# Patient Record
Sex: Female | Born: 1935 | Race: White | Hispanic: No | Smoking: Never smoker
Health system: Southern US, Community
[De-identification: ages and names within clinical notes are randomized; demographics above are authoritative.]

## PROBLEM LIST (undated history)

## (undated) DIAGNOSIS — R011 Cardiac murmur, unspecified: Secondary | ICD-10-CM

## (undated) DIAGNOSIS — K635 Polyp of colon: Secondary | ICD-10-CM

## (undated) DIAGNOSIS — M1990 Unspecified osteoarthritis, unspecified site: Secondary | ICD-10-CM

## (undated) DIAGNOSIS — Z9289 Personal history of other medical treatment: Secondary | ICD-10-CM

## (undated) DIAGNOSIS — K529 Noninfective gastroenteritis and colitis, unspecified: Secondary | ICD-10-CM

## (undated) DIAGNOSIS — T7840XA Allergy, unspecified, initial encounter: Secondary | ICD-10-CM

## (undated) DIAGNOSIS — R002 Palpitations: Secondary | ICD-10-CM

## (undated) DIAGNOSIS — Z5189 Encounter for other specified aftercare: Secondary | ICD-10-CM

## (undated) DIAGNOSIS — I5030 Unspecified diastolic (congestive) heart failure: Secondary | ICD-10-CM

## (undated) DIAGNOSIS — E079 Disorder of thyroid, unspecified: Secondary | ICD-10-CM

## (undated) DIAGNOSIS — I10 Essential (primary) hypertension: Secondary | ICD-10-CM

## (undated) DIAGNOSIS — F32A Depression, unspecified: Secondary | ICD-10-CM

## (undated) DIAGNOSIS — C801 Malignant (primary) neoplasm, unspecified: Secondary | ICD-10-CM

## (undated) DIAGNOSIS — K5790 Diverticulosis of intestine, part unspecified, without perforation or abscess without bleeding: Secondary | ICD-10-CM

## (undated) DIAGNOSIS — E785 Hyperlipidemia, unspecified: Secondary | ICD-10-CM

## (undated) DIAGNOSIS — Z9889 Other specified postprocedural states: Secondary | ICD-10-CM

## (undated) DIAGNOSIS — IMO0001 Reserved for inherently not codable concepts without codable children: Secondary | ICD-10-CM

## (undated) DIAGNOSIS — I341 Nonrheumatic mitral (valve) prolapse: Secondary | ICD-10-CM

## (undated) DIAGNOSIS — F329 Major depressive disorder, single episode, unspecified: Secondary | ICD-10-CM

## (undated) HISTORY — DX: Polyp of colon: K63.5

## (undated) HISTORY — DX: Cardiac murmur, unspecified: R01.1

## (undated) HISTORY — DX: Hyperlipidemia, unspecified: E78.5

## (undated) HISTORY — DX: Allergy, unspecified, initial encounter: T78.40XA

## (undated) HISTORY — DX: Nonrheumatic mitral (valve) prolapse: I34.1

## (undated) HISTORY — PX: OTHER SURGICAL HISTORY: SHX169

## (undated) HISTORY — DX: Other specified postprocedural states: Z98.890

## (undated) HISTORY — PX: HIP ARTHROSCOPY W/ LABRAL REPAIR: SHX1750

## (undated) HISTORY — DX: Reserved for inherently not codable concepts without codable children: IMO0001

## (undated) HISTORY — PX: BELPHAROPTOSIS REPAIR: SHX369

## (undated) HISTORY — DX: Encounter for other specified aftercare: Z51.89

## (undated) HISTORY — DX: Major depressive disorder, single episode, unspecified: F32.9

## (undated) HISTORY — DX: Essential (primary) hypertension: I10

## (undated) HISTORY — PX: PARTIAL HYSTERECTOMY: SHX80

## (undated) HISTORY — DX: Unspecified diastolic (congestive) heart failure: I50.30

## (undated) HISTORY — DX: Personal history of other medical treatment: Z92.89

## (undated) HISTORY — DX: Unspecified osteoarthritis, unspecified site: M19.90

## (undated) HISTORY — DX: Diverticulosis of intestine, part unspecified, without perforation or abscess without bleeding: K57.90

## (undated) HISTORY — PX: DILATION AND CURETTAGE, DIAGNOSTIC / THERAPEUTIC: SUR384

## (undated) HISTORY — DX: Palpitations: R00.2

## (undated) HISTORY — DX: Noninfective gastroenteritis and colitis, unspecified: K52.9

## (undated) HISTORY — DX: Disorder of thyroid, unspecified: E07.9

## (undated) HISTORY — DX: Depression, unspecified: F32.A

## (undated) HISTORY — DX: Malignant (primary) neoplasm, unspecified: C80.1

## (undated) HISTORY — PX: CATARACT EXTRACTION: SUR2

## (undated) HISTORY — PX: COLONOSCOPY: SHX174

---

## 2000-11-09 ENCOUNTER — Encounter: Payer: Self-pay | Admitting: Internal Medicine

## 2003-01-29 ENCOUNTER — Encounter: Payer: Self-pay | Admitting: Internal Medicine

## 2006-01-16 ENCOUNTER — Ambulatory Visit: Payer: Self-pay | Admitting: Internal Medicine

## 2006-01-30 ENCOUNTER — Ambulatory Visit: Payer: Self-pay | Admitting: Internal Medicine

## 2010-04-01 ENCOUNTER — Encounter: Payer: Self-pay | Admitting: Internal Medicine

## 2010-04-01 ENCOUNTER — Telehealth: Payer: Self-pay | Admitting: Internal Medicine

## 2010-04-02 DIAGNOSIS — K5909 Other constipation: Secondary | ICD-10-CM | POA: Insufficient documentation

## 2010-04-02 DIAGNOSIS — K5730 Diverticulosis of large intestine without perforation or abscess without bleeding: Secondary | ICD-10-CM | POA: Insufficient documentation

## 2010-04-02 DIAGNOSIS — Z860100 Personal history of colon polyps, unspecified: Secondary | ICD-10-CM | POA: Insufficient documentation

## 2010-04-05 ENCOUNTER — Other Ambulatory Visit: Payer: Self-pay | Admitting: Internal Medicine

## 2010-04-05 ENCOUNTER — Ambulatory Visit
Admission: RE | Admit: 2010-04-05 | Discharge: 2010-04-05 | Payer: Self-pay | Source: Home / Self Care | Attending: Internal Medicine | Admitting: Internal Medicine

## 2010-04-05 DIAGNOSIS — R197 Diarrhea, unspecified: Secondary | ICD-10-CM | POA: Insufficient documentation

## 2010-04-05 LAB — CBC WITH DIFFERENTIAL/PLATELET
Basophils Relative: 0.5 % (ref 0.0–3.0)
Eosinophils Relative: 1.3 % (ref 0.0–5.0)
HCT: 36 % (ref 36.0–46.0)
Lymphs Abs: 1.4 10*3/uL (ref 0.7–4.0)
MCV: 89.1 fl (ref 78.0–100.0)
Monocytes Absolute: 0.5 10*3/uL (ref 0.1–1.0)
Neutro Abs: 3.7 10*3/uL (ref 1.4–7.7)
Platelets: 329 10*3/uL (ref 150.0–400.0)
RBC: 4.04 Mil/uL (ref 3.87–5.11)
WBC: 5.7 10*3/uL (ref 4.5–10.5)

## 2010-04-07 ENCOUNTER — Encounter: Payer: Self-pay | Admitting: Internal Medicine

## 2010-04-15 NOTE — Procedures (Signed)
Summary: COLON   Colonoscopy  Procedure date:  11/09/2000  Findings:      Location:  [facility].   Patient Name: Rebecca Stanley, Rebecca Stanley MRN:  Procedure Procedures: Colonoscopy CPT: 16109.  Personnel: Endoscopist: Dora L. Juanda Chance, MD.  Exam Location: Exam performed in Outpatient Clinic. Outpatient  Patient Consent: Procedure, Alternatives, Risks and Benefits discussed, consent obtained, from patient.  Indications Symptoms: Constipation Patient's stools are infrequent. Patient has difficulty evacuating, strains with stool passage. Abdominal pain / bloating.  History  Pre-Exam Physical: Performed Nov 09, 2000. Cardio-pulmonary exam, Rectal exam, HEENT exam , Abdominal exam, Extremity exam, Neurological exam, Mental status exam WNL.  Exam Exam: Extent of exam reached: Cecum, extent intended: Cecum.  Colon retroflexion performed. Images taken. ASA Classification: II. Tolerance: good.  Monitoring: Pulse and BP monitoring, Oximetry used. Supplemental O2 given.  Colon Prep Used Golytely for colon prep. Prep results: fair.  Sedation Meds: Patient assessed and found to be appropriate for moderate (conscious) sedation. Fentanyl 100 mcg. Versed 7 mg.  Findings - POLYP: Sigmoid Colon, diminutive, sessile polyp. Procedure:  hot biopsy, removed, retrieved, Polyp sent to pathology. ICD9: Colon Polyps: 211.3.  - DIVERTICULOSIS: Descending Colon to Sigmoid Colon. Not bleeding. ICD9: Diverticulosis: 562.10.   Assessment Abnormal examination, see findings above.  Diagnoses: 562.10: Diverticulosis.  211.3: Colon Polyps.   Events  Unplanned Interventions: No intervention was required.  Unplanned Events: There were no complications. Plans  Post Exam Instructions: No aspirin or non-steroidal containing medications: 2 weeks.  Medication Plan: Await pathology. Anti-constipation: Cathartic Laxatives   Patient Education: Patient given standard instructions for: Polyps.  Diverticulosis. Yearly hemoccult testing recommended. Patient instructed to get routine colonoscopy every 5 years.  Comments: high fiber diet Disposition: After procedure patient sent to recovery. After recovery patient sent home.   This report was created from the original endoscopy report, which was reviewed and signed by the above listed endoscopist.    Appended Document: COLON Although report indicates that polyp was sent to pathology, no pathology can be located in patient's paper chart, EChart or at [facility] pathology as of date.

## 2010-04-15 NOTE — Progress Notes (Signed)
Summary: Gastroentology  Gastroentology   Imported By: Lamona Curl CMA (AAMA) 04/02/2010 13:52:48  _____________________________________________________________________  External Attachment:    Type:   Image     Comment:   External Document

## 2010-04-15 NOTE — Progress Notes (Signed)
Summary: Diarrhea x2wks  Phone Note Call from Patient Call back at Home Phone [phone] Call back at [phone]   Call For: Dr Juanda Chance Reason for Call: Talk to Nurse Summary of Call: Diarrhea x2wks unsure if she should go see her primary care. Initial call taken by: Leanor Kail [facility],  April 01, 2010 9:18 AM  Follow-up for Phone Call        Patient calling to report for the last 2-3 weeks she has had watery diarrhea 3-4 times/day. She reports 3 watery diarhea stools this AM. Denies nausea, vomiting, bleeding, new medications,out of country travel or recent surgery. States she has not taken antibiotics since Thanksgiving. Patient did take an Imodium this AM about has not been taking it. Patient states she is usually constipated so this is very different for her. Hx of adenomataous polyps. Last colon- 01/30/2006- mild diverticulosis, no recurrent polyps.  Patient scheduled to see Willette Cluster, RNP on 04/02/10 at 9:00 AM. Follow-up by: Jesse Fall RN,  April 01, 2010 10:06 AM  Additional Follow-up for Phone Call Additional follow up Details #1::        Could I see her on Monday 1/23 in place of Rolly Pancake who canceled? Please send her Cipro 250 mg, #10 1 by mouth two times a day  to take till I see her. Additional Follow-up by: Hart Carwin MD,  April 01, 2010 12:57 PM    Additional Follow-up for Phone Call Additional follow up Details #2::    Called patient and moved her appointment to 04/05/10 at 1:45 PM with Dr. Juanda Chance. Rx to pharmacy. Follow-up by: Jesse Fall RN,  April 01, 2010 1:13 PM  New/Updated Medications: CIPROFLOXACIN HCL 250 MG TABS (CIPROFLOXACIN HCL) Take one by mouth bid Prescriptions: CIPROFLOXACIN HCL 250 MG TABS (CIPROFLOXACIN HCL) Take one by mouth bid  #10 x 0   Entered by:   Jesse Fall RN   Authorized by:   Hart Carwin MD   Signed by:   Jesse Fall RN on 04/01/2010   Method used:   [facility]  [address] [id number]       [address]       [place], [place]  [address]       Ph: [phone] or [phone]       Fax: [phone]   RxID:   [id number]

## 2010-04-15 NOTE — Assessment & Plan Note (Signed)
Summary: Diarrhea/ Rebecca Stanley   History of Present Illness Visit Type: Initial Visit Primary GI MD: [name] MD Primary Provider: Hollace Kinnier Requesting Provider: n/a Chief Complaint: Pt c/o diarrhea History of Present Illness:   This is a 77 year old white female with a 2 week history of acute diarrhea. She has a history of chronic constipation for which we have seen her for in the past and have given her MiraLax 17 g daily. The acute diarrheal illness started rather suddenly. There was no fever or abdominal pain. There was no bleeding. She took antibiotics during Thanksgiving. Her husband who passed away 2 years ago had C. difficile colitis. Her last colonoscopy in 2007 showed diverticulosis. A prior colonoscopy in 2002 showed a small colon polyp, no pathology report is available as per [facility]ology.   GI Review of Systems    Reports belching and  bloating.      Denies abdominal pain, acid reflux, chest pain, dysphagia with liquids, dysphagia with solids, heartburn, loss of appetite, nausea, vomiting, vomiting blood, weight loss, and  weight gain.      Reports change in bowel habits, constipation, and  diarrhea.     Denies anal fissure, black tarry stools, diverticulosis, fecal incontinence, heme positive stool, hemorrhoids, irritable bowel syndrome, jaundice, light color stool, liver problems, rectal bleeding, and  rectal pain. Preventive Screening-Counseling & Management      Drug Use:  no.      Current Medications (verified): 1)  Ciprofloxacin Hcl 250 Mg Tabs (Ciprofloxacin Hcl) .... Take One By Mouth Bid 2)  Simvastatin 20 Mg Tabs (Simvastatin) .Marland Kitchen.. 1 By Mouth At Bedtime 3)  Perphenazine-Amitriptyline 4-50 Mg Tabs (Perphenazine-Amitriptyline) .... 2 By Mouth At Bedtime 4)  Synthroid 25 Mcg Tabs (Levothyroxine Sodium) .... 2.5 By Mouth Once Daily 5)  Fosamax 70 Mg Tabs (Alendronate Sodium) .Marland Kitchen.. 1 By Mouth Every Week 6)  [facility] Spectravite Senior  Tabs (Multiple  Vitamins-Minerals) .Marland Kitchen.. 1 By Mouth Once Daily 7)  Calcium 600 1500 Mg Tabs (Calcium Carbonate) .... 2 By Mouth Once Daily 8)  Fish Oil  Oil (Fish Oil) .... 1000mg  Two Times A Day 9)  Super B Complex  Tabs (B Complex-C) .Marland Kitchen.. 1 By Mouth Once Daily 10)  Melatonin 5 Mg Tabs (Melatonin) .... 10mg  At Bedtime  Allergies (verified): No Known Drug Allergies  Past History:  Past Medical History: Current Problems:  DIVERTICULOSIS, COLON (ICD-562.10) COLONIC POLYPS, BENIGN, HX OF (ICD-V12.72) CONSTIPATION, CHRONIC (ICD-564.09) Arthritis Depression Hyperlipidemia Pneumonia  2005  Past Surgical History: Bladder Tack Partial Hysterectomy D & C Cataract Extraction Eye lids were lifted  Family History: Reviewed history from 04/02/2010 and no changes required. Family History of Heart Disease: Aunt, Mother Family History of Diabetes: Grandmother Family History of Breast Cancer:Aunt  Social History: Illicit Drug Use - no retired 1 child Alcohol Use - no  Review of Systems       The patient complains of arthritis/joint pain, back pain, fatigue, hearing problems, and sleeping problems.  The patient denies allergy/sinus, anemia, anxiety-new, blood in urine, breast changes/lumps, change in vision, confusion, cough, coughing up blood, depression-new, fainting, fever, headaches-new, heart murmur, heart rhythm changes, itching, menstrual pain, muscle pains/cramps, night sweats, nosebleeds, pregnancy symptoms, shortness of breath, skin rash, sore throat, swelling of feet/legs, swollen lymph glands, thirst - excessive , urination - excessive , urination changes/pain, urine leakage, vision changes, and voice change.         Pertinent positive and negative review of systems were noted in the above  HPI. All other ROS was otherwise negative.   Vital Signs:  Patient profile:   77 year old female Height:      64 inches Weight:      126 pounds BMI:     21.71 BSA:     1.61 Pulse rate:   92 /  minute Pulse rhythm:   regular BP sitting:   112 / 60  (left arm)  Vitals Entered By: Merri Ray CMA Duncan Dull) (April 05, 2010 1:19 PM)  Physical Exam  General:  Well developed, well nourished, no acute distress. Eyes:  PERRLA, no icterus. Mouth:  No deformity or lesions, dentition normal. Neck:  Supple; no masses or thyromegaly. Lungs:  Clear throughout to auscultation. Heart:  Regular rate and rhythm; no murmurs, rubs,  or bruits. Abdomen:  soft nontender abdomen hyperactive bowel sounds. No tympany. No distention. Liver edge at costal margin Rectal:  soft Hemoccult negative stool Extremities:  No clubbing, cyanosis, edema or deformities noted. Skin:  Intact without significant lesions or rashes. Psych:  Alert and cooperative. Normal mood and affect.   Impression & Recommendations:  Problem # 1:  DIARRHEA (ICD-787.91) Patient has had an acute diarrheal illness consistent with acute infectious cause. We will obtain a stool for C. difficile as well as culture. We will add  Flagyl 250 mg p.o. t.i.d. for one week. We will also check her CBC today. She is to follow a low residue diet. Samples have also been provided of Align. Orders: TLB-CBC Platelet - w/Differential (85025-CBCD) T-Culture, Stool ([id number]) T-C diff by PCR (16109)  Problem # 2:  DIVERTICULOSIS, COLON (ICD-562.10) She is up-to-date on her colonoscopy. A recall will be due in 2017. Orders: TLB-CBC Platelet - w/Differential (85025-CBCD) T-Culture, Stool ([id number]) T-C diff by PCR (60454)  Patient Instructions: 1)  Please pick up your prescriptions at the pharmacy. Electronic prescription(s) has already been sent for Cipro 250 mg by mouth two times a day x 5 more days. We have also sent a prescription for Flagyl 250 mg three times a day x 7 days. 2)  We have given you samples of Align to take one capsule by mouth once daily. If this works well for you, it can be purchased over the counter. 3)   We have given you a low residue diet to follow. 4)  Your physician requests that you go to the basement floor of our office to have the following labwork completed before leaving today: CBC, C.Diff by PCR, Stool Culture 5)  Copy sent to : Dr Keturah Barre 6)  The medication list was reviewed and reconciled.  All changed / newly prescribed medications were explained.  A complete medication list was provided to the patient / caregiver. Prescriptions: FLAGYL 250 MG TABS (METRONIDAZOLE) Take 1 tablet by mouth three times a day x 7 days  #21 x 0   Entered by:   Lamona Curl CMA (AAMA)   Authorized by:   Hart Carwin MD   Signed by:   Lamona Curl CMA (AAMA) on 04/05/2010   Method used:   [facility]  [address] [id number]       [address]       [place], [place]  [address]       Ph: [phone] or [phone]       Fax: [phone]   RxID:   [id number] CIPROFLOXACIN HCL 250 MG TABS (CIPROFLOXACIN HCL) Take one by mouth two times a day  #  10 x 0   Entered by:   Lamona Curl CMA (AAMA)   Authorized by:   Hart Carwin MD   Signed by:   Lamona Curl CMA (AAMA) on 04/05/2010   Method used:   [facility]        [facility]  [address] [id number]       [address]       [place], [place]  [address]       Ph: [phone] or [phone]       Fax: [phone]   RxID:   [id number]   Appended Document: Diarrhea/ Regina please call pt with negative stool studies. Continue the antibiotics we gave her. Her C.Diff test was negative.

## 2010-04-15 NOTE — Procedures (Signed)
Summary: colonscopy   Patient Name: Rebecca Stanley, Rebecca Stanley MRN:  Procedure Procedures: Colonoscopy CPT: 29562.  Personnel: Endoscopist: Harryette Shuart L. Juanda Chance, MD.  Exam Location: Exam performed in Outpatient Clinic. Outpatient  Patient Consent: Procedure, Alternatives, Risks and Benefits discussed, consent obtained, from patient. Consent was obtained by the RN.  Indications  Surveillance of: Adenomatous Polyp(s). Initial polypectomy was performed in 2002. 1-2 Polyps were found at Index Exam. Largest polyp removed was 6 to 9 mm. Prior polyp located in distal colon.  History  Current Medications: Patient is taking an non-steroidal medication. COX-2Patient is not currently taking Coumadin.  Pre-Exam Physical: Performed Jan 30, 2006. Entire physical exam was normal.  Comments: Pt. history reviewed/updated, physical exam performed prior to initiation of sedation?yes Exam Exam: Extent of exam reached: Cecum, extent intended: Cecum.  The cecum was identified by appendiceal orifice and IC valve. Colon retroflexion performed. Images taken. ASA Classification: II. Tolerance: good.  Monitoring: Pulse and BP monitoring, Oximetry used. Supplemental O2 given.  Colon Prep Used Miralax for colon prep. Prep results: good.  Sedation Meds: Patient assessed and found to be appropriate for moderate (conscious) sedation. Fentanyl 75 mcg. given IV. Versed 5 mg. given IV.  Findings - NORMAL EXAM: Cecum.  - DIVERTICULOSIS: Sigmoid Colon. ICD9: Diverticulosis: 562.10. Comments: mild sigmoid diverticulosis.  - NORMAL EXAM: Rectum.    Comments: scope withdrawal time 7:30 min Assessment Abnormal examination, see findings above.  Diagnoses: 562.10: Diverticulosis.   Comments: mild diverticulosis, no recurrent polyps Events  Unplanned Interventions: No intervention was required.  Unplanned Events: There were no complications. Plans Patient Education: Patient given standard instructions for: Patient  instructed to get routine colonoscopy every 7 years.  Disposition: After procedure patient sent to recovery. After recovery patient sent home.   cc: Keturah Barre, MD  This report was created from the original endoscopy report, which was reviewed and signed by the above listed endoscopist.

## 2010-04-15 NOTE — Letter (Signed)
Summary: New Patient letter  [facility]  [address] [place], [place] [address]   Phone: [phone]  Fax: [phone]       04/01/2010 MRN: [id number]  Rebecca Stanley [address] [place], [place]  [address]  Dear Rebecca Stanley,  Welcome to the Gastroenterology Division at [facility].    You are scheduled to see Dr.  Juanda Chance  on 04/05/2010  at 1:45 PM on the 3rd floor at [facility], [address]  We ask that you try to arrive at our office 15 minutes prior to your appointment time to allow for check-in.  We would like you to complete the enclosed self-administered evaluation form prior to your visit and bring it with you on the day of your appointment.  We will review it with you.  Also, please bring a complete list of all your medications or, if you prefer, bring the medication bottles and we will list them.  Please bring your insurance card so that we may make a copy of it.  If your insurance requires a referral to see a specialist, please bring your referral form from your primary care physician.  Co-payments are due at the time of your visit and may be paid by cash, check or credit card.     Your office visit will consist of a consult with your physician (includes a physical exam), any laboratory testing he/she may order, scheduling of any necessary diagnostic testing (e.g. x-ray, ultrasound, CT-scan), and scheduling of a procedure (e.g. Endoscopy, Colonoscopy) if required.  Please allow enough time on your schedule to allow for any/all of these possibilities.    If you cannot keep your appointment, please call [phone] to cancel or reschedule prior to your appointment date.  This allows [place] the opportunity to schedule an appointment for another patient in need of care.  If you do not cancel or reschedule by 5 p.m. the business day prior to your appointment date, you will be charged a $50.00 late cancellation/no-show fee.    Thank you for choosing  [facility] for your medical needs.  We appreciate the opportunity to care for you.  Please visit [place] at our website  to learn more about our practice.                     Sincerely,                                                             The Gastroenterology Division

## 2010-04-16 ENCOUNTER — Telehealth: Payer: Self-pay | Admitting: Internal Medicine

## 2010-04-21 NOTE — Progress Notes (Signed)
Summary: Triage  Phone Note Call from Patient Call back at [phone]   Caller: Patient Call For: Dr. Juanda Chance Reason for Call: Talk to Nurse Summary of Call: Pt has finished her medication for diarrhea and her diarrhea is continuing, wants to speak with [facility] Initial call taken by: [place] Johnson,  April 16, 2010 8:36 AM  Follow-up for Phone Call        Spoke with patient. She has finished her Cipro and Flagyl on Monday. States she is still having diarrhea. Sometimes she has 1-2 diarrhea stools/day but yesterday she had 3. States she never has a formed stool just stools that are "apple sauce consistency." She is taking Librarian, academic. Stools were negative for c. diff on 04/07/10. Please, advise. Follow-up by: Jesse Fall RN,  April 16, 2010 9:22 AM  Additional Follow-up for Phone Call Additional follow up Details #1::        I would like to try Medrol pack, #1, follow instructions on the package, she may have micrscopic colitis ( post infectious), then call us back. If no improvement, will consider flex sigmoid. Additional Follow-up by: Hart Carwin MD,  April 16, 2010 2:01 PM    Additional Follow-up for Phone Call Additional follow up Details #2::    Spoke with patient. Rx sent to pharmacy. She will call back with an update. Follow-up by: Jesse Fall RN,  April 16, 2010 2:36 PM  New/Updated Medications: MEDROL (PAK) 4 MG TABS (METHYLPREDNISOLONE) Take as directed Prescriptions: MEDROL (PAK) 4 MG TABS (METHYLPREDNISOLONE) Take as directed  #1 x 0   Entered by:   Jesse Fall RN   Authorized by:   Hart Carwin MD   Signed by:   Jesse Fall RN on 04/16/2010   Method used:   [facility]        [facility]  [address] [id number]       [address]       [place], [place]  [address]       Ph: [phone] or [phone]       Fax: [phone]   RxID:   [id number]

## 2010-04-23 ENCOUNTER — Telehealth (INDEPENDENT_AMBULATORY_CARE_PROVIDER_SITE_OTHER): Payer: Self-pay | Admitting: *Deleted

## 2010-05-05 NOTE — Progress Notes (Signed)
  Phone Note Outgoing Call Call back at [facility] Phone [phone]   Call placed by: Jesse Fall, RN Call placed to: Patient Summary of Call: Message left for patient to call back with an update on how she is doing.  Initial call taken by: Jesse Fall RN,  April 23, 2010 10:29 AM  Follow-up for Phone Call        Patient called to report she is feeling much better. She has finished her dose pack. Now she is having problems with constipation. She will try Miralax 1/2 dose to see if this helps and increase to full dose if no results. Follow-up by: Jesse Fall RN,  April 23, 2010 1:46 PM  Additional Follow-up for Phone Call Additional follow up Details #1::        OK Additional Follow-up by: Hart Carwin MD,  April 23, 2010 10:28 PM

## 2010-05-13 ENCOUNTER — Ambulatory Visit (INDEPENDENT_AMBULATORY_CARE_PROVIDER_SITE_OTHER): Payer: Medicare Other | Admitting: Internal Medicine

## 2010-05-13 ENCOUNTER — Encounter: Payer: Self-pay | Admitting: Internal Medicine

## 2010-05-13 DIAGNOSIS — K5289 Other specified noninfective gastroenteritis and colitis: Secondary | ICD-10-CM

## 2010-05-20 NOTE — Assessment & Plan Note (Signed)
Summary: 3 week follow-up   History of Present Illness Visit Type: Follow-up Visit Primary GI MD: [name] MD Primary Provider: Hollace Kinnier Requesting Provider: n/a Chief Complaint: 3 week follow-up c/o constipation now History of Present Illness:   This is a 77 year old white female with an acute diarrheal illness which subsided after a course of Medrol pack. She failed Flagyl and Cipro prior to the Medrol steroid taper. She is now well and is back to her constipation habits. She is on MiraLax which seems to be too strong for her and she does not want to take it. She takes 2 stool softeners in the morning. She denies any rectal bleeding or abdominal pain. Her last colonoscopy in 2007 showed mild diverticulosis of the left colon. A colonoscopy in 2002 showed a diminutive polyp of the left colon but no pathology report is available.   GI Review of Systems    Reports bloating.       Reports constipation.    Preventive Screening-Counseling & Management  Alcohol-Tobacco     Smoking Status: never    Current Medications (verified): 1)  Simvastatin 20 Mg Tabs (Simvastatin) .Marland Kitchen.. 1 By Mouth At Bedtime 2)  Perphenazine-Amitriptyline 4-50 Mg Tabs (Perphenazine-Amitriptyline) .... 2 By Mouth At Bedtime 3)  Synthroid 25 Mcg Tabs (Levothyroxine Sodium) .... 2.5 By Mouth Once Daily 4)  Fosamax 70 Mg Tabs (Alendronate Sodium) .Marland Kitchen.. 1 By Mouth Every Week 5)  [facility] Spectravite Senior  Tabs (Multiple Vitamins-Minerals) .Marland Kitchen.. 1 By Mouth Once Daily 6)  Calcium 600 1500 Mg Tabs (Calcium Carbonate) .... 2 By Mouth Once Daily 7)  Fish Oil  Oil (Fish Oil) .... 1000mg  Two Times A Day 8)  Super B Complex  Tabs (B Complex-C) .Marland Kitchen.. 1 By Mouth Once Daily 9)  Melatonin 5 Mg Tabs (Melatonin) .... 10mg  At Bedtime 10)  Align 4 Mg Caps (Probiotic Product) .Marland Kitchen.. 1 By Mouth Once Daily  Allergies (verified): No Known Drug Allergies  Past History:  Past Medical History: Reviewed history from 04/05/2010 and no  changes required. Current Problems:  DIVERTICULOSIS, COLON (ICD-562.10) COLONIC POLYPS, BENIGN, HX OF (ICD-V12.72) CONSTIPATION, CHRONIC (ICD-564.09) Arthritis Depression Hyperlipidemia Pneumonia  2005  Past Surgical History: Reviewed history from 04/05/2010 and no changes required. Bladder Tack Partial Hysterectomy D & C Cataract Extraction Eye lids were lifted  Family History: Reviewed history from 04/02/2010 and no changes required. Family History of Heart Disease: Aunt, Mother Family History of Diabetes: Grandmother Family History of Breast Cancer:Aunt No FH of Colon Cancer: Family History of Ovarian Cancer: cousin  Social History: Reviewed history from 04/05/2010 and no changes required. Illicit Drug Use - no retired 1 child Alcohol Use - no Daily Caffeine Use Patient has never smoked.  Smoking Status:  never  Vital Signs:  Patient profile:   77 year old female Height:      64 inches Weight:      125.8 pounds BMI:     21.67 Pulse rate:   76 / minute Pulse rhythm:   regular BP sitting:   146 / 78  (left arm)  Vitals Entered By: Milford Cage NCMA (May 13, 2010 11:20 AM)   Impression & Recommendations:  Problem # 1:  DIARRHEA (ICD-787.91) This has subsided after a one-week course of prednisone taper. 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 She is now constipated.  Problem # 2:  CONSTIPATION, CHRONIC (ICD-564.09) Patient has chronic functional constipation. She will add milk of magnesia 1-2 tablespoons at bedtime in  addition to stool softeners in the morning.  Patient Instructions: 1)  continue stool softeners, 2 tablets in the morning. 2)  Milk of magnesia 1-2 tablespoons at night. 3)  High-fiber diet. 4)  No plans for recall colonoscopy due to age. 5)  Copy sent to : Dr R. Robbins 6)  The medication list was reviewed and reconciled.  All changed / newly prescribed medications were explained.  A complete medication list was provided to  the patient / caregiver.

## 2010-05-31 ENCOUNTER — Telehealth: Payer: Self-pay | Admitting: Internal Medicine

## 2010-06-01 ENCOUNTER — Telehealth: Payer: Self-pay | Admitting: *Deleted

## 2010-06-01 DIAGNOSIS — R197 Diarrhea, unspecified: Secondary | ICD-10-CM

## 2010-06-01 MED ORDER — METHYLPREDNISOLONE 4 MG PO KIT: PACK | ORAL | Status: AC

## 2010-06-01 NOTE — Telephone Encounter (Signed)
Spoke with patient and gave her Dr. Regino Schultze recommendations from 05/31/10 note. [facility] [address] consultant[facility] for Medrol pack take as directed. Scheduled patient for colonoscopy on 07/15/10 at 9:00 AM with previsit on 06/30/10 at 10:30 AM.

## 2010-06-10 NOTE — Progress Notes (Signed)
Summary: triage  Phone Note Call from Patient Call back at Home Phone [phone]   Caller: Patient Call For: Dr. Juanda Chance Reason for Call: Talk to Nurse Summary of Call: Patient is calling to speak with a nurse about her diarrhea, she has had it again for a couple of weeks Initial call taken by: [place] Johnson,  May 31, 2010 10:59 AM  Follow-up for Phone Call        Patient calling to report that one week ago Thursday, she started with diarrhea again. States she is having loose, thin stools 1-3 times/day. Denies cramps or abdominal pain. States her stomach is "rolling". Occassional nausea and states she has lost weight(125 lbs to 121 llbs) States she is having heartburn that is relieved by Tums. Feeling sometimes like "food is in my throat." Taking Align.Last colonoscopy- 01/30/06- mild diverticulosis. Please, advise Follow-up by: Jesse Fall RN,  May 31, 2010 11:39 AM  Additional Follow-up for Phone Call Additional follow up Details #1::        please give her again Medrol pack, and schedule for colonoscopy. Additional Follow-up by: Hart Carwin MD,  May 31, 2010 5:16 PM    Additional Follow-up for Phone Call Additional follow up Details #2::    See notes in [facility]

## 2010-06-30 ENCOUNTER — Ambulatory Visit (AMBULATORY_SURGERY_CENTER): Payer: Medicare Other | Admitting: *Deleted

## 2010-06-30 VITALS — Ht 62.0 in | Wt 122.0 lb

## 2010-06-30 DIAGNOSIS — Z1211 Encounter for screening for malignant neoplasm of colon: Secondary | ICD-10-CM

## 2010-06-30 MED ORDER — PEG-KCL-NACL-NASULF-NA ASC-C 100 G PO SOLR: ORAL | Status: DC

## 2010-07-15 ENCOUNTER — Other Ambulatory Visit: Payer: Medicare Other | Admitting: Internal Medicine

## 2010-07-21 ENCOUNTER — Encounter: Payer: Self-pay | Admitting: Internal Medicine

## 2010-07-22 ENCOUNTER — Ambulatory Visit (AMBULATORY_SURGERY_CENTER): Payer: Medicare Other | Admitting: Internal Medicine

## 2010-07-22 ENCOUNTER — Encounter: Payer: Self-pay | Admitting: Internal Medicine

## 2010-07-22 VITALS — BP 139/67 | HR 66 | Temp 97.0°F | Resp 24 | Ht 65.0 in | Wt 125.0 lb

## 2010-07-22 DIAGNOSIS — D126 Benign neoplasm of colon, unspecified: Secondary | ICD-10-CM

## 2010-07-22 DIAGNOSIS — Z1211 Encounter for screening for malignant neoplasm of colon: Secondary | ICD-10-CM

## 2010-07-22 DIAGNOSIS — Z86010 Personal history of colonic polyps: Secondary | ICD-10-CM

## 2010-07-22 DIAGNOSIS — R197 Diarrhea, unspecified: Secondary | ICD-10-CM

## 2010-07-22 DIAGNOSIS — K5289 Other specified noninfective gastroenteritis and colitis: Secondary | ICD-10-CM

## 2010-07-22 MED ORDER — SODIUM CHLORIDE 0.9 % IV SOLN: 500.0000 mL | INTRAVENOUS | Status: DC

## 2010-07-22 NOTE — Patient Instructions (Signed)
PT. AND CAREPARTNER GIVEN INSTRUCTIONS AND INFORMATION ON DIVERTICULOSIS AND HIGH FIBER DIET . BIOPSIES WERE TAKEN IN COLON AND YOU WILL RECEIVE A LETTER WITH PATHOLOGY RESULTS AND RECOMMENDATIONS FROM DR BRODIE WITHIN A COUPLE OF WEEKS. WE WILL CALL YOU IN THE AM TO CHECK ON YOU.

## 2010-07-23 ENCOUNTER — Telehealth: Payer: Self-pay

## 2010-07-23 NOTE — Telephone Encounter (Signed)
Follow up Call- Patient questions:  Do you have a fever, pain , or abdominal swelling? no Pain Score  0 *  Have you tolerated food without any problems? yes  Have you been able to return to your normal activities? yes  Do you have any questions about your discharge instructions: Diet   no Medications  no Follow up visit  no  Do you have questions or concerns about your Care? no  Actions: * If pain score is 4 or above: No action needed, pain <4. Pt asked when she would hear from Bx taken...ibuprofen advised she would receive a letter in the mail 1-2 weeks.  MAW

## 2010-07-30 ENCOUNTER — Encounter: Payer: Self-pay | Admitting: Internal Medicine

## 2010-08-02 ENCOUNTER — Telehealth: Payer: Self-pay | Admitting: Internal Medicine

## 2010-08-02 ENCOUNTER — Telehealth: Payer: Self-pay | Admitting: *Deleted

## 2010-08-02 MED ORDER — BUDESONIDE 3 MG PO CP24: ORAL_CAPSULE | ORAL | Status: DC

## 2010-08-02 NOTE — Telephone Encounter (Signed)
Message copied by Jesse Fall on Mon Aug 02, 2010 11:07 AM ------      Message from: [name]      Created: Fri Jul 30, 2010 10:38 PM       This pt was asked to call You dddue to new diagnosis of lymphocytic colitis. Please start her on Entecort 3mg , #90, 3 po qd.x 4 weeks, the 6mg /day x 4 weeks then 3 mg/day x 4 weeks., then OV

## 2010-08-02 NOTE — Telephone Encounter (Signed)
Left message for patient to call me back. 

## 2010-08-02 NOTE — Telephone Encounter (Signed)
Spoke with patient and gave her Dr. Regino Schultze recommendations. Rx sent. Note to me to schedule patient in August for f/u visit.

## 2010-08-02 NOTE — Telephone Encounter (Signed)
Message copied by Jesse Fall on Mon Aug 02, 2010 11:03 AM ------      Message from: [name]      Created: Fri Jul 30, 2010 10:38 PM       This pt was asked to call You dddue to new diagnosis of lymphocytic colitis. Please start her on Entecort 3mg , #90, 3 po qd.x 4 weeks, the 6mg /day x 4 weeks then 3 mg/day x 4 weeks., then OV

## 2010-08-03 NOTE — Telephone Encounter (Signed)
I have spoken to the pt and to the son Mr Gunnells. We agreed to stop Zocor, which can  Cause lymphocytic colitis. She will also reduce her  Entecort to 6 mg/day and will call us with an update in 2 weeks.

## 2010-08-03 NOTE — Telephone Encounter (Signed)
Pt's son called d/t concern over dx of  Lymphocytic Colitis and meds prescribed. He wanted to know how serious the disease is as well as the prognosis. Son has been on the [facility] website and he wants Dr Juanda Chance to know pt has been on Zocor for years and it and Zoloft has been listed as risk factors for developing LC. Also, pt had hip repair of the gluteus medius tendon at [facility] and was told to avoid steroid use and Entocort was prescribed. Son wants to know: 1) should pt contact her cardiologist and change Zocor?   2) should pt switch from Zoloft?   3) how serious is LC and prognosis? Thanks.

## 2010-08-03 NOTE — Telephone Encounter (Signed)
lmom for son to return my call.

## 2010-08-16 ENCOUNTER — Encounter: Payer: Self-pay | Admitting: Cardiovascular Disease

## 2010-08-16 ENCOUNTER — Ambulatory Visit (INDEPENDENT_AMBULATORY_CARE_PROVIDER_SITE_OTHER): Payer: Medicare Other | Admitting: Cardiovascular Disease

## 2010-08-16 DIAGNOSIS — I059 Rheumatic mitral valve disease, unspecified: Secondary | ICD-10-CM

## 2010-08-16 DIAGNOSIS — R0609 Other forms of dyspnea: Secondary | ICD-10-CM

## 2010-08-16 DIAGNOSIS — E782 Mixed hyperlipidemia: Secondary | ICD-10-CM | POA: Insufficient documentation

## 2010-08-16 DIAGNOSIS — R0602 Shortness of breath: Secondary | ICD-10-CM | POA: Insufficient documentation

## 2010-08-16 DIAGNOSIS — I341 Nonrheumatic mitral (valve) prolapse: Secondary | ICD-10-CM

## 2010-08-16 DIAGNOSIS — R0600 Dyspnea, unspecified: Secondary | ICD-10-CM

## 2010-08-16 DIAGNOSIS — E785 Hyperlipidemia, unspecified: Secondary | ICD-10-CM

## 2010-08-16 NOTE — Patient Instructions (Signed)
Your physician wants you to follow-up in: 6 MONTHS You will receive a reminder letter in the mail two months in advance. If you don't receive a letter, please call our office to schedule the follow-up appointment.   Your physician has requested that you have an echocardiogram. Echocardiography is a painless test that uses sound waves to create images of your heart. It provides your doctor with information about the size and shape of your heart and how well your heart's chambers and valves are working. This procedure takes approximately one hour. There are no restrictions for this procedure. AT [facility]  Your physician recommends that you return for lab work in: FASTING

## 2010-08-16 NOTE — Assessment & Plan Note (Signed)
The patient has what seems to be chronic exertional dyspnea which is slightly worse over the last few months. She does have history of further mild mitral regurgitation. Will request an echocardiogram for further evaluation. I don't think her symptoms represent angina.

## 2010-08-16 NOTE — Progress Notes (Signed)
HPI  This is a 77 year old female who is here today to transfer her cardiovascular care. She is to followup with Dr.Dhatt before he retired. She has history of mitral valve prolapse with chronic exertional dyspnea. There was only mild mitral regurgitation. Her most recent stress test was in 2009 which showed no significant ischemia with normal ejection fraction. She also had a carotid Doppler performed which showed no significant disease. She has history of hyperlipidemia which was treated with simvastatin. She recently developed lymphocytic colitis of unknown etiology. Simvastatin was stopped due to that reason as a possible cause for this. She has been doing reasonably well with no recent chest pain or palpitations. She did have one brief episode of chest tightness about 3 months ago. No further episodes. She denies any dizziness, syncope or presyncope. She is a lifelong nonsmoker. She is physically active and exercises 5 times a week. She walks 2 miles over an hour. She does use a cane.  No Known Allergies   Current Outpatient Prescriptions on File Prior to Visit  Medication Sig Dispense Refill  . alendronate (FOSAMAX) 70 MG tablet Take 70 mg by mouth every 7 (seven) days. Take with a full glass of water on an empty stomach.       . B Complex-C (B-COMPLEX WITH VITAMIN C) tablet Take 1 tablet by mouth daily.        . calcium carbonate (OS-CAL) 600 MG TABS Take 600 mg by mouth 2 (two) times daily with a meal.        . fish oil-omega-3 fatty acids 1000 MG capsule Take 2 g by mouth daily.        Marland Kitchen levothyroxine (SYNTHROID, LEVOTHROID) 25 MCG tablet Take 25 mcg by mouth daily.        . Multiple Vitamins-Minerals (MULTIVITAMIN WITH MINERALS) tablet Take 1 tablet by mouth daily.        . Nutritional Supplements (MELATONIN PO) Take 10 mg by mouth daily.        Marland Kitchen Perphenazine-Amitriptyline 4-50 MG TABS Take 2 tablets by mouth at bedtime.        . simvastatin (ZOCOR) 20 MG tablet Take 20 mg by mouth at  bedtime.        Marland Kitchen DISCONTD: budesonide (ENTOCORT EC) 3 MG 24 hr capsule Take 3 tabs(9mg ) po daily x 4 weeks, then 6 mg(2 tabs) daily x 4 weeks then 3 mg(1 tab) daily x 4 weeks.  90 capsule  2   Current Facility-Administered Medications on File Prior to Visit  Medication Dose Route Frequency Provider Last Rate Last Dose  . 0.9 %  sodium chloride infusion  500 mL Intravenous Continuous Hart Carwin, MD         Past Medical History  Diagnosis Date  . Allergy   . Depression   . Blood transfusion   . Arthritis   . Osteoporosis   . Thyroid disease   . Heart murmur   . Mitral valve prolapse   . Hyperlipidemia      Past Surgical History  Procedure Date  . Heel tendon repair   . Colonoscopy   . Hip arthroscopy w/ labral repair      History reviewed. No pertinent family history.   History   Social History  . Marital Status: Single    Spouse Name: N/A    Number of Children: N/A  . Years of Education: N/A   Occupational History  . Not on file.   Social History Main Topics  .  Smoking status: Never Smoker   . Smokeless tobacco: Never Used  . Alcohol Use: No  . Drug Use: No  . Sexually Active: Not on file   Other Topics Concern  . Not on file   Social History Narrative  . No narrative on file     ROS Constitutional: Negative for fever, chills, diaphoresis, activity change, appetite change and fatigue.  HENT: Negative for hearing loss, nosebleeds, congestion, sore throat, facial swelling, drooling, trouble swallowing, neck pain, voice change, sinus pressure and tinnitus.  Eyes: Negative for photophobia, pain, discharge and visual disturbance.  Respiratory: Negative for apnea, cough, chest tightness,  and wheezing.  Cardiovascular: Negative for chest pain, palpitations and leg swelling.  Gastrointestinal: Negative for nausea, vomiting, abdominal pain, diarrhea, constipation, blood in stool and abdominal distention.  Genitourinary: Negative for dysuria, urgency,  frequency, hematuria and decreased urine volume.  Musculoskeletal: Negative for myalgias, back pain, joint swelling, arthralgias and gait problem.  Skin: Negative for color change, pallor, rash and wound.  Neurological: Negative for dizziness, tremors, seizures, syncope, speech difficulty, weakness, light-headedness, numbness and headaches.  Psychiatric/Behavioral: Negative for suicidal ideas, hallucinations, behavioral problems and agitation. The patient is not nervous/anxious.     PHYSICAL EXAM   BP 142/68  Pulse 73  Ht 5\' 5"  (1.651 m)  Wt 121 lb (54.885 kg)  BMI 20.14 kg/m2  SpO2 98%  Constitutional: She is oriented to person, place, and time. She appears well-developed and well-nourished. No distress.  HENT: No nasal discharge.  Head: Normocephalic and atraumatic.  Eyes: Pupils are equal, round, and reactive to light. Right eye exhibits no discharge. Left eye exhibits no discharge.  Neck: Normal range of motion. Neck supple. No JVD present. No thyromegaly present.  Cardiovascular: Normal rate, regular rhythm, normal heart sounds and intact distal pulses. Exam reveals no gallop and no friction rub.  There is a 1/6 systolic ejection murmur heard at the base of the heart which seems to be a functional murmur. Pulmonary/Chest: Effort normal and breath sounds normal. No stridor. No respiratory distress. She has no wheezes. She has no rales. She exhibits no tenderness.  Abdominal: Soft. Bowel sounds are normal. She exhibits no distension. There is no tenderness. There is no rebound and no guarding.  Musculoskeletal: Normal range of motion. She exhibits no edema and no tenderness.  Neurological: She is alert and oriented to person, place, and time. Coordination normal.  Skin: Skin is warm and dry. No rash noted. She is not diaphoretic. No erythema. No pallor.  Psychiatric: She has a normal mood and affect. Her behavior is normal. Judgment and thought content normal.     EKG: Normal  sinus rhythm, left atrial enlargement, incomplete right bundle branch block. No significant ST or T wave changes no evidence of prior infarcts.  ASSESSMENT AND PLAN

## 2010-08-16 NOTE — Assessment & Plan Note (Signed)
No recent echocardiogram. She had mild regurgitation with her most recent evaluation. This will be evaluated by an echocardiogram.

## 2010-08-16 NOTE — Progress Notes (Signed)
Addended by: Freddi Starr on: 08/16/2010 04:04 PM   Modules accepted: Orders

## 2010-08-16 NOTE — Assessment & Plan Note (Signed)
She is currently off simvastatin due to lymphocytic colitis. We'll check fasting lipid profile and see if treatment is needed.

## 2010-08-17 ENCOUNTER — Telehealth: Payer: Self-pay | Admitting: Internal Medicine

## 2010-08-17 NOTE — Telephone Encounter (Signed)
Left a message for patient to call me back.

## 2010-08-17 NOTE — Telephone Encounter (Signed)
Patient calling to report she is doing better. She is taking Entocort 2 po/day. She wants to know if this is how she should continue until OV. Scheduled patient to see Dr. Juanda Chance on 10/04/10 at 8:30 AM

## 2010-08-18 NOTE — Telephone Encounter (Signed)
Left a message for patient to call me. 

## 2010-08-18 NOTE — Telephone Encounter (Signed)
Please take Entecort 6 mg/day till July 1.2012, then 3mg /day till she sees me. May need a refill.

## 2010-08-19 NOTE — Telephone Encounter (Signed)
Spoke with patient and gave her Dr. Regino Schultze recommendations. She has plenty of medication.

## 2010-08-24 ENCOUNTER — Telehealth: Payer: Self-pay | Admitting: *Deleted

## 2010-08-24 NOTE — Telephone Encounter (Signed)
error 

## 2010-09-06 ENCOUNTER — Encounter: Payer: Self-pay | Admitting: Cardiovascular Disease

## 2010-10-04 ENCOUNTER — Encounter: Payer: Self-pay | Admitting: Internal Medicine

## 2010-10-04 ENCOUNTER — Ambulatory Visit (INDEPENDENT_AMBULATORY_CARE_PROVIDER_SITE_OTHER): Payer: Medicare Other | Admitting: Internal Medicine

## 2010-10-04 VITALS — BP 112/64 | HR 80 | Ht 64.0 in | Wt 123.0 lb

## 2010-10-04 DIAGNOSIS — K5289 Other specified noninfective gastroenteritis and colitis: Secondary | ICD-10-CM

## 2010-10-04 NOTE — Progress Notes (Signed)
Rebecca Stanley 1930/12/05 MRN [id number]    History of Present Illness:  This is a 77 year old white female with a diagnosis of microscopic colitis made in May 2012 on a colonoscopy. A prior colonoscopy in 2002 showed a tubular adenoma. She was treated with Entocort 9 mg for 2 weeks which was tapered down to 3 mg a day in the last 3 weeks. She is asymptomatic. Her diarrhea has resolved. She is now constipated taking milk of magnesia 30-45 cc every other day.   Past Medical History  Diagnosis Date  . Allergy   . Depression   . Blood transfusion   . Arthritis   . Osteoporosis   . Thyroid disease   . Heart murmur   . Mitral valve prolapse   . Hyperlipidemia   . Diverticulosis   . Colon polyp   . Colitis    Past Surgical History  Procedure Date  . Heel tendon repair   . Colonoscopy   . Hip arthroscopy w/ labral repair   . Bladder tack   . Partial hysterectomy   . Dilation and curettage, diagnostic / therapeutic   . Cataract extraction   . Belpharoptosis repair     reports that she has never smoked. She has never used smokeless tobacco. She reports that she does not drink alcohol or use illicit drugs. family history includes Breast cancer in an unspecified family member; Diabetes in an unspecified family member; Heart disease in her mother and unspecified family member; and Ovarian cancer in an unspecified family member.  There is no history of Colon cancer. No Known Allergies      Review of Systems: Positive for occasional cramps. Positive for constipation. Negative for rectal bleeding  The remainder of the 10  point ROS is negative except as outlined in H&P  Assessment and Plan: Microscopic colitis resolved on Entocort. She will discontinue her Entocort. If her symptoms recur, she will call us. We may try mesalamine or antispasmodics in place of steroids. She will return when necessary.   10/04/2010 Rebecca Stanley

## 2010-10-04 NOTE — Patient Instructions (Signed)
Discontinue Entocort.

## 2010-10-30 ENCOUNTER — Other Ambulatory Visit: Payer: Self-pay | Admitting: Gastroenterology

## 2010-10-30 ENCOUNTER — Telehealth: Payer: Self-pay | Admitting: Gastroenterology

## 2010-10-30 NOTE — Telephone Encounter (Signed)
Pt called c/o cramping abdominal pain.  No frver or diarrhea.  Has h/o of microscopic colitis.  She thinks she has diverticulitis.   I explained to pt that this is not likely diverticulitis.  Will call in hyomax. I instructed pt to c/b if not improved or if she develops fever or rectal bleeding.

## 2011-01-28 ENCOUNTER — Telehealth: Payer: Self-pay | Admitting: Gastroenterology

## 2011-01-28 NOTE — Telephone Encounter (Signed)
Please continue Levsin SL.125 mg q 4 hrs, and stay on full liquids x 24 hrs.

## 2011-01-28 NOTE — Telephone Encounter (Signed)
Pt states that she thinks she is having a diverticulitis flare, it started around 1pm. She is c/o abdominal cramping and bloating, pain below her belly button on the left side. States several months ago the doctor on call gave her some hyocyamine, she took one of these at 1pm but states it has not helped. Dr. Juanda Chance please advise.

## 2011-01-31 MED ORDER — HYOSCYAMINE SULFATE 0.125 MG SL SUBL: 0.1250 mg | SUBLINGUAL_TABLET | SUBLINGUAL | Status: DC | PRN

## 2011-01-31 NOTE — Telephone Encounter (Signed)
Spoke with pt and she states she is better. Rx called to pharmacy.

## 2011-02-16 ENCOUNTER — Ambulatory Visit: Payer: Medicare Other | Admitting: Cardiovascular Disease

## 2011-02-21 ENCOUNTER — Telehealth: Payer: Self-pay | Admitting: Cardiovascular Disease

## 2011-02-21 NOTE — Telephone Encounter (Signed)
PT AWARE MAY HAVE PMD FOLLOW CHOLESTEROL INFORMED  AS LONG AS SOMEONE WAS MONITORING  PER PT IS TAKING  ZETIA 10 MG  AND FISH OIL 2 G  SIMVASTATIN 20 MG WAS DISCONTINUED  DID NOT TOLERATE .  WAS CHANGED TO ZETIA BY PMD PT  TO CALL  PMD OFFICE  TO SCHEDULE RECHECK OF LABS  WAS DONE APPROX 6 WEEKS ./CY

## 2011-02-21 NOTE — Telephone Encounter (Signed)
New Msg: Pt calling stating that her cholesterol is high and pt recently got on different medicine. Pt didn't know if Dr. Eden Emms wanted to check pt cholesterol prior to pt appt on Friday or if Dr. Eden Emms wants pt to manage cholesterol through pt PCP. Please return pt call to discuss further.

## 2011-02-25 ENCOUNTER — Ambulatory Visit (INDEPENDENT_AMBULATORY_CARE_PROVIDER_SITE_OTHER): Payer: Medicare Other | Admitting: Cardiovascular Disease

## 2011-02-25 ENCOUNTER — Encounter: Payer: Self-pay | Admitting: Cardiovascular Disease

## 2011-02-25 VITALS — BP 135/75 | HR 72 | Ht 63.0 in | Wt 125.0 lb

## 2011-02-25 DIAGNOSIS — R0609 Other forms of dyspnea: Secondary | ICD-10-CM

## 2011-02-25 DIAGNOSIS — E785 Hyperlipidemia, unspecified: Secondary | ICD-10-CM

## 2011-02-25 DIAGNOSIS — I341 Nonrheumatic mitral (valve) prolapse: Secondary | ICD-10-CM

## 2011-02-25 DIAGNOSIS — R0989 Other specified symptoms and signs involving the circulatory and respiratory systems: Secondary | ICD-10-CM

## 2011-02-25 DIAGNOSIS — I2510 Atherosclerotic heart disease of native coronary artery without angina pectoris: Secondary | ICD-10-CM

## 2011-02-25 DIAGNOSIS — R0600 Dyspnea, unspecified: Secondary | ICD-10-CM

## 2011-02-25 DIAGNOSIS — I059 Rheumatic mitral valve disease, unspecified: Secondary | ICD-10-CM

## 2011-02-25 MED ORDER — ROSUVASTATIN CALCIUM 10 MG PO TABS: 10.0000 mg | ORAL_TABLET | Freq: Every day | ORAL | Status: DC

## 2011-02-25 NOTE — Patient Instructions (Signed)
Your physician recommends that you schedule a follow-up appointment as needed with Dr. Eden Emms.

## 2011-02-25 NOTE — Assessment & Plan Note (Signed)
No significant murmur on exam  Echo in future for new symptoms or change in exam

## 2011-02-25 NOTE — Assessment & Plan Note (Signed)
Resolved with normal exam , ecg and good EF by previous echo.  Follow

## 2011-02-25 NOTE — Assessment & Plan Note (Signed)
Poor control.  Add Crestor 10 mg samples given  Monitor for recurrent colitis  F/U Dr Sherral Hammers in [place]

## 2011-02-25 NOTE — Progress Notes (Signed)
This is a 77 year old female who is here today to transfer her cardiovascular care. She is to followup with Dr.Dhatt before he retired. The saw Dr Kirke Corin  She has history of mitral valve prolapse with chronic exertional dyspnea. There was only mild mitral regurgitation. Her most recent stress test was in 2009 which showed no significant ischemia with normal ejection fraction. She also had a carotid Doppler performed which showed no significant disease. She has history of hyperlipidemia which was treated with simvastatin. She recently developed lymphocytic colitis of unknown etiology. Simvastatin was stopped due to that reason as a possible cause for this. She has been doing reasonably well with no recent chest pain or palpitations. She did have one brief episode of chest tightness about 3 months ago. No further episodes. She denies any dizziness, syncope or presyncope. She is a lifelong nonsmoker. She is physically active and exercises 5 times a week. She walks 2 miles over an hour. She does use a cane.  Discussed cholesterol issue.  Labs from 01/03/11 reviewed LDL 215 HDL 76 TC 301  Taking Zetia.  Not clear that colitis was from simvastatin.  Will try crestor.  F/U Dr Sherral Hammers in [place].    ROS: Denies fever, malais, weight loss, blurry vision, decreased visual acuity, cough, sputum, SOB, hemoptysis, pleuritic pain, palpitaitons, heartburn, abdominal pain, melena, lower extremity edema, claudication, or rash.  All other systems reviewed and negative  General: Affect appropriate Healthy:  appears stated age HEENT: normal Neck supple with no adenopathy JVP normal no bruits no thyromegaly Lungs clear with no wheezing and good diaphragmatic motion Heart:  S1/S2 no murmur,rub, gallop or click PMI normal Abdomen: benighn, BS positve, no tenderness, no AAA no bruit.  No HSM or HJR Distal pulses intact with no bruits No edema Neuro non-focal Skin warm and dry No muscular weakness   Current  Outpatient Prescriptions  Medication Sig Dispense Refill  . Cyanocobalamin (VITAMIN B-12 CR PO) Take 1 tablet by mouth daily.        Marland Kitchen ezetimibe (ZETIA) 10 MG tablet Take 10 mg by mouth daily.        . fish oil-omega-3 fatty acids 1000 MG capsule Take 2 g by mouth daily.        . hyoscyamine (LEVSIN, ANASPAZ) 0.125 MG tablet Take 0.125 mg by mouth every 4 (four) hours as needed.        Marland Kitchen levothyroxine (SYNTHROID, LEVOTHROID) 25 MCG tablet Take 25 mcg by mouth daily.        Marland Kitchen lidocaine (LIDODERM) 5 % Place 1 patch onto the skin as needed. Remove & Discard patch within 12 hours or as directed by MD       . Multiple Vitamins-Minerals (MULTIVITAMIN WITH MINERALS) tablet Take 1 tablet by mouth daily.        Marland Kitchen Perphenazine-Amitriptyline 4-50 MG TABS Take 2 tablets by mouth at bedtime.        . rosuvastatin (CRESTOR) 10 MG tablet Take 1 tablet (10 mg total) by mouth at bedtime.  30 tablet  11   Current Facility-Administered Medications  Medication Dose Route Frequency Provider Last Rate Last Dose  . DISCONTD: 0.9 %  sodium chloride infusion  500 mL Intravenous Continuous Hart Carwin, MD        Allergies  Review of patient's allergies indicates no known allergies.  Electrocardiogram:  NSR normal ECG rate 72  Assessment and Plan

## 2011-03-02 ENCOUNTER — Telehealth: Payer: Self-pay | Admitting: Cardiovascular Disease

## 2011-03-02 NOTE — Telephone Encounter (Signed)
error 

## 2011-03-02 NOTE — Telephone Encounter (Signed)
Pt called, dr Elease Hashimoto consulted and pt is to take synthroid on empty stomach 60 minutes before eating. crestor at [facility],  meds are ok to take. Pt verbalized understanding. Alfonso Ramus RN

## 2011-03-02 NOTE — Telephone Encounter (Signed)
New message:  Tricor was changed to Crestor and in the information packet it stated if the patient had Thyroid disease (synthroid) there could be a side effect of the medication. Please call patient and advise if it is ok for her to take.

## 2011-05-27 ENCOUNTER — Telehealth: Payer: Self-pay | Admitting: Cardiovascular Disease

## 2011-05-27 NOTE — Telephone Encounter (Signed)
LMTCB ./CY 

## 2011-05-27 NOTE — Telephone Encounter (Signed)
SPOKE WITH PT  STATES B/P HAS BEEN SPIKING IN THE EVENING  HOURS FOR ABOUT  A MONTH   PER PT PMD  INITIATED B/P MEDICINE INSTRUCTED PT TO CALL DR ROBBINS TO MAKE ADJUSTMENTS  WITH B/P AND THAT HE CAN TREAT B/P. PER LAST OFFICE NOTE ONLY NEEDS TO RETURN  AS NEEDED . PT VERBALIZED UNDERSTANDING .Rebecca Stanley

## 2011-05-27 NOTE — Telephone Encounter (Signed)
New Msg: Pt calling wanting to speak with nurse/MD about pt BP shooting up in the middle of the night. Please return pt call to discuss further.

## 2012-01-20 DIAGNOSIS — F32A Depression, unspecified: Secondary | ICD-10-CM | POA: Diagnosis present

## 2012-11-14 ENCOUNTER — Encounter: Payer: Self-pay | Admitting: Internal Medicine

## 2013-02-26 ENCOUNTER — Telehealth: Payer: Self-pay | Admitting: Internal Medicine

## 2013-02-26 MED ORDER — HYDROCORTISONE ACETATE 25 MG RE SUPP: RECTAL | Status: DC

## 2013-02-26 NOTE — Telephone Encounter (Signed)
Patient states she has been on Cefdinir 500 mg since Saturday for sinus problems. Yesterday, she had some bright, red blood in toilet and on tissue. She has had it again today. States stools are like oatmeal which is her normal. She has had a little cramping in abdomen. Please, advise.

## 2013-02-26 NOTE — Telephone Encounter (Signed)
She has a hx of microscopic colitis 2112, please send Hydrocortisone supp 25mg , #10, insert 1 qhs

## 2013-02-26 NOTE — Telephone Encounter (Signed)
Rx sent. Spoke with patient and gave her recommendations. 

## 2013-04-02 ENCOUNTER — Encounter: Payer: Self-pay | Admitting: Podiatrist

## 2013-04-02 ENCOUNTER — Ambulatory Visit (INDEPENDENT_AMBULATORY_CARE_PROVIDER_SITE_OTHER): Payer: Medicare Other | Admitting: Podiatrist

## 2013-04-02 VITALS — BP 115/61 | HR 72 | Resp 18

## 2013-04-02 DIAGNOSIS — M2040 Other hammer toe(s) (acquired), unspecified foot: Secondary | ICD-10-CM

## 2013-04-02 NOTE — Progress Notes (Signed)
   Subjective:    Patient ID: Rebecca Stanley, female    DOB: [birth year], 80 y.o.   MRN: [id number]  HPI patient presents today with pain on right second toe. She states "my 2nd toe on my right foot has been hurting and I can't wear any shoes and it has been going on for more than a year and gets tender".  She tried padding on the toe with no relief in symptoms.    Review of Systems  Constitutional: Positive for fatigue.  HENT: Positive for hearing loss.   Eyes: Negative.   Respiratory: Negative.   Cardiovascular: Negative.   Gastrointestinal: Negative.   Endocrine: Negative.   Genitourinary: Negative.   Musculoskeletal:       Back pain and muscle pain and difficulty walking  Skin: Negative.   Allergic/Immunologic: Negative.   Neurological: Negative.   Hematological: Bruises/bleeds easily.  Psychiatric/Behavioral: The patient is nervous/anxious.        Objective:   Physical Exam Neurovascular status is intact with palpable symmetric pedal pulses and neurological sensation intact. She has a hammertoe deformity of the second digit of the right foot which is swollen and enlarged. This hyperkeratotic lesion over the proximal interphalangeal joint present as well intact integument is noted.       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 Applied a hammertoe pad for her use. Discussed the possibility of an injection into the joint if it continues to swell and be uncomfortable. Discussed ultimately surgical correction be required they continue to give her pain. Discussed shoe gear changes.

## 2013-09-10 ENCOUNTER — Encounter: Payer: Self-pay | Admitting: Podiatrist

## 2013-09-10 ENCOUNTER — Ambulatory Visit (INDEPENDENT_AMBULATORY_CARE_PROVIDER_SITE_OTHER): Payer: Medicare Other | Admitting: Podiatrist

## 2013-09-10 VITALS — BP 137/66 | HR 71 | Resp 18

## 2013-09-10 DIAGNOSIS — L84 Corns and callosities: Secondary | ICD-10-CM

## 2013-09-10 NOTE — Progress Notes (Signed)
Chief Complaint  Patient presents with  . Callouses    HURTS ON THE BALL OF MY RIGHT FOOT     HPI: Patient is 80 y.o. female who presents today for callus submetatarsal one right foot     Physical Exam Neurovascular status is intact and unchanged to the right foot. Prominent and plantarflexed first metatarsal is noted with an overlying callus present. Ground glass appearance is noted. Contracture deformity of the right second toe is noted with slight callus tissue dorsally.  Assessment: Plantar callus right foot  Plan: Debridement of the callus is carried out with a 15 blade without complication. She will be seen back as needed for followup. She will continue to wear her sandals to decrease the rub on her toe.

## 2014-12-23 ENCOUNTER — Encounter: Payer: Self-pay | Admitting: Internal Medicine

## 2015-06-22 DIAGNOSIS — E538 Deficiency of other specified B group vitamins: Secondary | ICD-10-CM | POA: Diagnosis present

## 2015-06-23 DIAGNOSIS — R7303 Prediabetes: Secondary | ICD-10-CM | POA: Diagnosis present

## 2016-07-07 ENCOUNTER — Other Ambulatory Visit (INDEPENDENT_AMBULATORY_CARE_PROVIDER_SITE_OTHER): Payer: Medicare Other

## 2016-07-07 ENCOUNTER — Encounter: Payer: Self-pay | Admitting: Pulmonary Disease

## 2016-07-07 ENCOUNTER — Ambulatory Visit (INDEPENDENT_AMBULATORY_CARE_PROVIDER_SITE_OTHER): Payer: Medicare Other | Admitting: Pulmonary Disease

## 2016-07-07 VITALS — BP 102/58 | HR 48 | Ht 61.0 in | Wt 129.2 lb

## 2016-07-07 DIAGNOSIS — R0609 Other forms of dyspnea: Secondary | ICD-10-CM

## 2016-07-07 LAB — CBC
HCT: 36.6 % (ref 36.0–46.0)
HEMOGLOBIN: 12.4 g/dL (ref 12.0–15.0)
MCHC: 33.8 g/dL (ref 30.0–36.0)
MCV: 88 fl (ref 78.0–100.0)
PLATELETS: 303 10*3/uL (ref 150.0–400.0)
RBC: 4.16 Mil/uL (ref 3.87–5.11)
RDW: 13.8 % (ref 11.5–15.5)
WBC: 5.3 10*3/uL (ref 4.0–10.5)

## 2016-07-07 LAB — COMPREHENSIVE METABOLIC PANEL
ALT: 18 U/L (ref 0–35)
AST: 26 U/L (ref 0–37)
Albumin: 4.1 g/dL (ref 3.5–5.2)
Alkaline Phosphatase: 48 U/L (ref 39–117)
BUN: 15 mg/dL (ref 6–23)
CO2: 31 meq/L (ref 19–32)
Calcium: 10.1 mg/dL (ref 8.4–10.5)
Chloride: 96 mEq/L (ref 96–112)
Creatinine, Ser: 0.79 mg/dL (ref 0.40–1.20)
GFR: 73.74 mL/min (ref >60.00)
GLUCOSE: 81 mg/dL (ref 70–99)
POTASSIUM: 4 meq/L (ref 3.5–5.1)
Sodium: 132 mEq/L — ABNORMAL LOW (ref 135–145)
Total Bilirubin: 0.5 mg/dL (ref 0.2–1.2)
Total Protein: 7.1 g/dL (ref 6.0–8.3)

## 2016-07-07 NOTE — Progress Notes (Signed)
Past Surgical History She  has a past surgical history that includes heel tendon repair; Colonoscopy; Hip arthroscopy w/ labral repair; bladder tack; Partial hysterectomy; Dilation and curettage, diagnostic / therapeutic; Cataract extraction; and Blepharoptosis repair.  Allergies  Allergen Reactions  . Lisinopril   . Losartan Swelling    Facial swelling    Family History Her family history includes Heart disease in her mother; Rheum arthritis in her father.  Social History She  reports that she has never smoked. She has never used smokeless tobacco. She reports that she does not drink alcohol or use drugs.  Review of systems Constitutional: Negative for fever and unexpected weight change.  HENT: Negative for congestion, dental problem, ear pain, nosebleeds, postnasal drip, rhinorrhea, sinus pressure, sneezing, sore throat and trouble swallowing.   Eyes: Negative for redness and itching.  Respiratory: Positive for shortness of breath. Negative for cough, chest tightness and wheezing.   Cardiovascular: Negative for palpitations and leg swelling.  Gastrointestinal: Negative for nausea and vomiting.       Acid heartburn  Genitourinary: Negative for dysuria.  Musculoskeletal: Positive for arthralgias. Negative for joint swelling.  Skin: Negative for rash.  Neurological: Negative for headaches.  Hematological: Does not bruise/bleed easily.  Psychiatric/Behavioral: Positive for dysphoric mood. The patient is not nervous/anxious.     Current Outpatient Prescriptions on File Prior to Visit  Medication Sig  . buPROPion (WELLBUTRIN SR) 100 MG 12 hr tablet   . fish oil-omega-3 fatty acids 1000 MG capsule Take 2 g by mouth daily.    Marland Kitchen levothyroxine (SYNTHROID, LEVOTHROID) 25 MCG tablet Take 25 mcg by mouth daily.    . Multiple Vitamins-Minerals (MULTIVITAMIN WITH MINERALS) tablet Take 1 tablet by mouth daily.    Marland Kitchen Perphenazine-Amitriptyline 4-50 MG TABS Take 2 tablets by mouth at bedtime.     Marland Kitchen amLODipine (NORVASC) 5 MG tablet Take 5 mg by mouth daily.  Marland Kitchen azithromycin (ZITHROMAX) 250 MG tablet   . cefdinir (OMNICEF) 300 MG capsule   . Cyanocobalamin (VITAMIN B-12 CR PO) Take 1 tablet by mouth daily.    Marland Kitchen ezetimibe (ZETIA) 10 MG tablet Take 10 mg by mouth daily.    Marland Kitchen FLECTOR 1.3 % PTCH   . fluticasone (FLONASE) 50 MCG/ACT nasal spray   . hydrocortisone (ANUSOL-HC) 25 MG suppository Insert one rectally nightly (Patient not taking: Reported on 07/07/2016)  . hyoscyamine (LEVSIN, ANASPAZ) 0.125 MG tablet Take 0.125 mg by mouth every 4 (four) hours as needed.    . hyoscyamine (LEVSIN/SL) 0.125 MG SL tablet Place 1 tablet (0.125 mg total) under the tongue every 4 (four) hours as needed for cramping.  . lidocaine (LIDODERM) 5 % Place 1 patch onto the skin as needed. Remove & Discard patch within 12 hours or as directed by MD   . rosuvastatin (CRESTOR) 10 MG tablet Take 1 tablet (10 mg total) by mouth at bedtime.  . simvastatin (ZOCOR) 20 MG tablet   . valsartan (DIOVAN) 320 MG tablet    No current facility-administered medications on file prior to visit.     Chief Complaint  Patient presents with  . PULMONARY CONSULT    Self Referral for COPD. CXRs 06/01/2016.     Cardiac tests LHC 06/06/16 >> normal coronaries, EF 65%  Past medical history She  has a past medical history of Allergy; Arthritis; Blood transfusion; Colitis; Colon polyp; Depression; Diverticulosis; Heart murmur; Hyperlipidemia; Hypertension; Mitral valve prolapse; Osteoporosis; and Thyroid disease.  Vital signs BP (!) 102/58 (BP Location: Left Arm, Cuff  Size: Normal)   Pulse (!) 48   Ht 5\' 1"  (1.549 m)   Wt 129 lb 3.2 oz (58.6 kg)   SpO2 95%   BMI 24.41 kg/m   History of Present Illness Rebecca Stanley is a 83 y.o. female for evaluation of dyspnea.  She was hospitalized with pneumonia in December.  She has noticed having more trouble with her breathing and feeling weak after this.  She does okay at rest, and  while asleep.  If she goes to the grocery store she has to stop and rest while she is there.  She had recent heart catheterization which was unremarkable.  She had a chest xray from 06/01/16 which showed changes of COPD.  She denies cough, wheeze, chest pain, sputum production, palpitations, skin rash, joint swelling, or leg swelling.  No hx of asthma, emphysema, connective tissue disease, or thromboembolic disease.  She never smoked cigarettes.  She had several prior episodes of pneumonia, but never had exposure to tuberculosis.  Physical Exam:  General - No distress Eyes - wears glasses, pupils reactive ENT - No sinus tenderness, no oral exudate, no LAN, no thyromegaly, TM clear, pupils equal/reactive Cardiac - s1s2 regular, no murmur, pulses symmetric Chest - No wheeze/rales/dullness, good air entry, normal respiratory excursion Back - No focal tenderness Abd - Soft, non-tender, no organomegaly, + bowel sounds Ext - No edema Neuro - Normal strength, cranial nerves intact Skin - No rashes Psych - Normal mood, and behavior  Discussion: She has dyspnea on exertion that has progressed since episode of pneumonia in December 2017.  She denies history of smoking, but recent chest xray showed changes suggestive of COPD.  She had recent cardiac evaluation that was unremarkable.  She likely has a component of deconditioning.  Assessment/plan:  Dyspnea on exertion. - will get lab tests today - will arrange for pulmonary function testing and CT chest with contrast - additional diagnostics/therapies to be determined based on test results    Patient Instructions  Lab tests today  Will schedule pulmonary function test and CT chest with IV contrast  Follow up in 4 weeks with Dr. Halford Chessman or Nurse Practitioner    Chesley Mires, M.D. Pager [phone] 07/07/2016, 9:18 AM

## 2016-07-07 NOTE — Progress Notes (Signed)
   Subjective:    Patient ID: Francee Piccolo, female    DOB: Jan 19, 1931, 83 y.o.   MRN: [id number]  HPI    Review of Systems  Constitutional: Negative for fever and unexpected weight change.  HENT: Negative for congestion, dental problem, ear pain, nosebleeds, postnasal drip, rhinorrhea, sinus pressure, sneezing, sore throat and trouble swallowing.   Eyes: Negative for redness and itching.  Respiratory: Positive for shortness of breath. Negative for cough, chest tightness and wheezing.   Cardiovascular: Negative for palpitations and leg swelling.  Gastrointestinal: Negative for nausea and vomiting.       Acid heartburn  Genitourinary: Negative for dysuria.  Musculoskeletal: Positive for arthralgias. Negative for joint swelling.  Skin: Negative for rash.  Neurological: Negative for headaches.  Hematological: Does not bruise/bleed easily.  Psychiatric/Behavioral: Positive for dysphoric mood. The patient is not nervous/anxious.        Objective:   Physical Exam        Assessment & Plan:

## 2016-07-07 NOTE — Patient Instructions (Signed)
Lab tests today  Will schedule pulmonary function test and CT chest with IV contrast  Follow up in 4 weeks with Dr. Halford Chessman or Nurse Practitioner

## 2016-07-11 ENCOUNTER — Telehealth: Payer: Self-pay | Admitting: Pulmonary Disease

## 2016-07-11 NOTE — Telephone Encounter (Signed)
CMP Latest Ref Rng & Units 07/07/2016  Glucose 70 - 99 mg/dL 81  BUN 6 - 23 mg/dL 15  Creatinine 0.40 - 1.20 mg/dL 0.79  Sodium 135 - 145 mEq/L 132(L)  Potassium 3.5 - 5.1 mEq/L 4.0  Chloride 96 - 112 mEq/L 96  CO2 19 - 32 mEq/L 31  Calcium 8.4 - 10.5 mg/dL 10.1  Total Protein 6.0 - 8.3 g/dL 7.1  Total Bilirubin 0.2 - 1.2 mg/dL 0.5  Alkaline Phos 39 - 117 U/L 48  AST 0 - 37 U/L 26  ALT 0 - 35 U/L 18    CBC Latest Ref Rng & Units 07/07/2016 04/05/2010  WBC 4.0 - 10.5 K/uL 5.3 5.7  Hemoglobin 12.0 - 15.0 g/dL 12.4 12.2  Hematocrit 36.0 - 46.0 % 36.6 36.0  Platelets 150.0 - 400.0 K/uL 303.0 329.0    Will have my nurse inform pt that labs were normal.  Will call after reviewing her CT chest and PFT.

## 2016-07-12 ENCOUNTER — Ambulatory Visit (INDEPENDENT_AMBULATORY_CARE_PROVIDER_SITE_OTHER): Payer: Medicare Other | Admitting: Pulmonary Disease

## 2016-07-12 ENCOUNTER — Ambulatory Visit (INDEPENDENT_AMBULATORY_CARE_PROVIDER_SITE_OTHER)
Admission: RE | Admit: 2016-07-12 | Discharge: 2016-07-12 | Disposition: A | Discharge status: Home / Self Care | Payer: Medicare Other | Source: Ambulatory Visit | Attending: Pulmonary Disease | Admitting: Pulmonary Disease

## 2016-07-12 DIAGNOSIS — R0609 Other forms of dyspnea: Secondary | ICD-10-CM

## 2016-07-12 MED ORDER — IOPAMIDOL (ISOVUE-300) INJECTION 61%
80.0000 mL | Freq: Once | INTRAVENOUS | Status: AC | PRN
  Administered 2016-07-12: 80 mL via INTRAVENOUS

## 2016-07-12 NOTE — Progress Notes (Signed)
PFT done today. 

## 2016-07-13 NOTE — Telephone Encounter (Signed)
Patient returning call, CB is [phone].

## 2016-07-13 NOTE — Telephone Encounter (Signed)
Spoke with pt, aware of results/recs.  Nothing further needed.  

## 2016-07-13 NOTE — Telephone Encounter (Signed)
LM x 1 for results

## 2016-07-18 ENCOUNTER — Telehealth: Payer: Self-pay | Admitting: Pulmonary Disease

## 2016-07-18 NOTE — Telephone Encounter (Signed)
lmtcb x1 for pt. 

## 2016-07-19 NOTE — Telephone Encounter (Signed)
CT chest 07/12/16 >> atherosclerosis, 5 mm nodule RML, RUL tree in bud cluster, scoliosis   Results d/w pt over the phone.

## 2016-07-19 NOTE — Telephone Encounter (Signed)
Spoke with pt, requesting CT chest results from 5/1.  CT impression available in Epic.  VS please advise on CT results.  Thanks!

## 2016-07-19 NOTE — Telephone Encounter (Signed)
Patient returned phone call.Rebecca Stanley ° °

## 2016-07-25 LAB — PULMONARY FUNCTION TEST
FEF 25-75 Post: 1.72 L/sec
FEF 25-75 Pre: 0.68 L/sec
FEF2575-%Change-Post: 151 %
FEF2575-%PRED-POST: 168 %
FEF2575-%Pred-Pre: 67 %
FEV1-%Change-Post: 60 %
FEV1-%PRED-PRE: 73 %
FEV1-%Pred-Post: 118 %
FEV1-POST: 1.75 L
FEV1-PRE: 1.09 L
FEV1FVC-%CHANGE-POST: 83 %
FEV1FVC-%PRED-PRE: 67 %
FEV6-%Change-Post: -10 %
FEV6-%Pred-Post: 102 %
FEV6-%Pred-Pre: 115 %
FEV6-Post: 1.95 L
FEV6-Pre: 2.18 L
FEV6FVC-%Pred-Post: 107 %
FEV6FVC-%Pred-Pre: 107 %
FVC-%Change-Post: -12 %
FVC-%PRED-POST: 95 %
FVC-%PRED-PRE: 109 %
FVC-POST: 1.95 L
FVC-PRE: 2.22 L
PRE FEV1/FVC RATIO: 49 %
Post FEV1/FVC ratio: 90 %
Post FEV6/FVC ratio: 100 %
Pre FEV6/FVC Ratio: 100 %
RV % pred: 137 %
RV: 3.14 L
TLC % pred: 111 %
TLC: 5.03 L

## 2016-08-04 ENCOUNTER — Ambulatory Visit (INDEPENDENT_AMBULATORY_CARE_PROVIDER_SITE_OTHER): Payer: Medicare Other | Admitting: Acute Care

## 2016-08-04 ENCOUNTER — Encounter: Payer: Self-pay | Admitting: Acute Care

## 2016-08-04 DIAGNOSIS — R0609 Other forms of dyspnea: Secondary | ICD-10-CM

## 2016-08-04 DIAGNOSIS — R911 Solitary pulmonary nodule: Secondary | ICD-10-CM | POA: Diagnosis not present

## 2016-08-04 MED ORDER — FLUTICASONE FUROATE-VILANTEROL 100-25 MCG/INH IN AEPB: 1.0000 | INHALATION_SPRAY | Freq: Every day | RESPIRATORY_TRACT | 0 refills | Status: DC

## 2016-08-04 NOTE — Progress Notes (Signed)
I have reviewed and agree with assessment/plan.  Chesley Mires, MD [facility] Pulmonary/Critical Care 08/04/2016, 2:55 PM Pager:  [phone]

## 2016-08-04 NOTE — Progress Notes (Signed)
Patient seen in the office today and instructed on use of   BreoPatient expressed understanding and demonstrated technique. TA/CMA

## 2016-08-04 NOTE — Assessment & Plan Note (Signed)
Continued dyspnea on exertion Pulmonary function tests indicate probable asthma with good bronchodilator response Plan We will try a therapeutic trial of Breo 100 Take this once daily  Every day. Rinse mouth after use. We will instruct you on use. We will also send in a prescription for a rescue inhaler. Use this for break through shortness of breath. Rescue inhaler should not be used more than 4 times daily. If you are using it frequently, please call the office so we can see you for possible flare. We will schedule a CT chest without contrast 07/2017 for nodule follow up. Follow up appointment in 4 weeks with Judson Roch of Dr. Halford Chessman Please contact office for sooner follow up if symptoms do not improve or worsen or seek emergency care

## 2016-08-04 NOTE — Progress Notes (Signed)
History of Present Illness Rebecca Stanley is a 83 y.o. female never smoker seen by Dr. Halford Chessman for evaluation of Dyspnea on exertion.    HPI: Rebecca Stanley  was hospitalized with pneumonia in December.  Since discharge she has noticed progressive  dyspnea on exertion with fatigue  .  She does okay at rest, and while asleep.  If she goes to the grocery store she has to stop and rest while she is there.  She had recent heart catheterization which was unremarkable.  She had a chest xray from 06/01/16 which showed changes of COPD.  At time of initial consultation she denied cough, wheeze, chest pain, sputum production, palpitations, skin rash, joint swelling, or leg swelling.  No hx of asthma, emphysema, connective tissue disease, or thromboembolic disease.  She never smoked cigarettes.  She had several prior episodes of pneumonia, but never had exposure to tuberculosis.  08/04/2016 Pt. Presents for 4 week follow up. She was seen by Dr. Halford Chessman 07/07/2016 for dyspnea on exertion. Plan at that time was for pulmonary function tests, and CT chest with IV contrast. Pulmonary function tests indicate obstruction with air trapping. Patient has a significant bronchodilator response. CT chest with contrast indicates some atherosclerosis, small right middle lobe nodule, and right upper lobe tree-in-bud cluster. She states her dyspnea on exertion is about the same. She continues to be concerned about the fact that she has significant fatigue.  PFT hemoglobin test showed a hemoglobin of 13.5. She denies chest pain, fever, orthopnea, or hemoptysis.   Test Results: CT chest 07/12/16 >> atherosclerosis, 5 mm nodule RML, RUL tree in bud cluster, scoliosis PFT's 07/12/2016>> Mild obstruction on spirometry.Very significant BD response. Air trapping on lung volumes. Unable to perform diffusion capacity technique. Significant bronchodilator response.    CBC Latest Ref Rng & Units 07/07/2016 04/05/2010  WBC 4.0 - 10.5 K/uL 5.3 5.7   Hemoglobin 12.0 - 15.0 g/dL 12.4 12.2  Hematocrit 36.0 - 46.0 % 36.6 36.0  Platelets 150.0 - 400.0 K/uL 303.0 329.0    BMP Latest Ref Rng & Units 07/07/2016  Glucose 70 - 99 mg/dL 81  BUN 6 - 23 mg/dL 15  Creatinine 0.40 - 1.20 mg/dL 0.79  Sodium 135 - 145 mEq/L 132(L)  Potassium 3.5 - 5.1 mEq/L 4.0  Chloride 96 - 112 mEq/L 96  CO2 19 - 32 mEq/L 31  Calcium 8.4 - 10.5 mg/dL 10.1    PFT    Component Value Date/Time   FEV1PRE 1.09 07/12/2016 1540   FEV1POST 1.75 07/12/2016 1540   FVCPRE 2.22 07/12/2016 1540   FVCPOST 1.95 07/12/2016 1540   TLC 5.03 07/12/2016 1540   PREFEV1FVCRT 49 07/12/2016 1540   PSTFEV1FVCRT 90 07/12/2016 1540    Ct Chest W Contrast  Result Date: 07/12/2016 CLINICAL DATA:  Increased shortness of breath for 3-4 months. EXAM: CT CHEST WITH CONTRAST TECHNIQUE: Multidetector CT imaging of the chest was performed during intravenous contrast administration. CONTRAST:  66mL ISOVUE-300 IOPAMIDOL (ISOVUE-300) INJECTION 61% COMPARISON:  None FINDINGS: Cardiovascular: Normal heart size. Aortic atherosclerosis noted. No pericardial effusion. Mediastinum/Nodes: No enlarged mediastinal, hilar, or axillary lymph nodes. Thyroid gland, trachea, and esophagus demonstrate no significant findings. Lungs/Pleura: Right middle lobe pulmonary nodule measures 5 mm, image 86 is series 3. Cluster of tree-in-bud nodularity within the right upper, image number 58 of series 3. No atelectasis or airspace consolidation. Upper Abdomen: No acute abnormality. Musculoskeletal: Scoliosis and degenerative disc disease identified. There are treated compression deformities identified at L1, L2 and  L3. IMPRESSION: 1. No acute cardiopulmonary abnormalities. 2. Cluster of tree-in-bud nodules within the right upper lobe is nonspecific but may be related to chronic atypical inflammatory or infectious process including a MAI. 3. Pulmonary nodule in the right lower lobe measures 5 mm. No follow-up needed if  patient is low-risk. Non-contrast chest CT can be considered in 12 months if patient is high-risk. This recommendation follows the consensus statement: Guidelines for Management of Incidental Pulmonary Nodules Detected on CT Images: From the Fleischner Society 2017; Radiology 2017; 284:228-243. Electronically Signed   By: Kerby Moors M.D.   On: 07/12/2016 16:01     Past medical hx Past Medical History:  Diagnosis Date  . Allergy   . Arthritis   . Blood transfusion   . Colitis   . Colon polyp   . Depression   . Diverticulosis   . Heart murmur   . Hyperlipidemia   . Hypertension   . Mitral valve prolapse   . Osteoporosis   . Thyroid disease      Social History  Substance Use Topics  . Smoking status: Never Smoker  . Smokeless tobacco: Never Used  . Alcohol use No    Tobacco Cessation: Never smoker  Past surgical hx, Family hx, Social hx all reviewed.  Current Outpatient Prescriptions on File Prior to Visit  Medication Sig  . bisoprolol (ZEBETA) 5 MG tablet Take 5 mg by mouth daily.  Marland Kitchen buPROPion (WELLBUTRIN SR) 100 MG 12 hr tablet   . CALCIUM PO Take 1 tablet by mouth daily.  . Cholecalciferol (VITAMIN D PO) Take 1 tablet by mouth daily.  . fish oil-omega-3 fatty acids 1000 MG capsule Take 2 g by mouth daily.    . hydrochlorothiazide (HYDRODIURIL) 12.5 MG tablet Take 12.5 mg by mouth daily.  Marland Kitchen levothyroxine (SYNTHROID, LEVOTHROID) 25 MCG tablet Take 25 mcg by mouth daily.    . Multiple Vitamins-Minerals (MULTIVITAMIN WITH MINERALS) tablet Take 1 tablet by mouth daily.    Marland Kitchen Perphenazine-Amitriptyline 4-50 MG TABS Take 2 tablets by mouth at bedtime.    Marland Kitchen UNABLE TO FIND Med Name: Beet Root - 1 Capsule daily  . hyoscyamine (LEVSIN/SL) 0.125 MG SL tablet Place 1 tablet (0.125 mg total) under the tongue every 4 (four) hours as needed for cramping.  . rosuvastatin (CRESTOR) 10 MG tablet Take 1 tablet (10 mg total) by mouth at bedtime.   No current facility-administered  medications on file prior to visit.      Allergies  Allergen Reactions  . Lisinopril   . Losartan Swelling    Facial swelling    Review Of Systems:  Constitutional:   No  weight loss, night sweats,  Fevers, chills, + fatigue, or  lassitude.  HEENT:   No headaches,  Difficulty swallowing,  Tooth/dental problems, or  Sore throat,                No sneezing, itching, ear ache, nasal congestion, post nasal drip,   CV:  No chest pain,  Orthopnea, PND, swelling in lower extremities, anasarca, dizziness, palpitations, syncope.   GI  No heartburn, indigestion, abdominal pain, nausea, vomiting, diarrhea, change in bowel habits, loss of appetite, bloody stools.   Resp: +shortness of breath with exertion not  at rest.  No excess mucus, no productive cough,  No non-productive cough,  No coughing up of blood.  No change in color of mucus.  No wheezing.  No chest wall deformity  Skin: no rash or lesions.  GU: no  dysuria, change in color of urine, no urgency or frequency.  No flank pain, no hematuria   MS:  No joint pain or swelling.  No decreased range of motion.  No back pain.  Psych:  No change in mood or affect. No depression or anxiety.  No memory loss.   Vital Signs BP 130/60 (BP Location: Left Arm, Cuff Size: Normal)   Pulse 78   Ht 5' 0.5" (1.537 m)   Wt 131 lb 9.6 oz (59.7 kg)   SpO2 96%   BMI 25.28 kg/m    Physical Exam:  General- No distress,  A&Ox3, elderly female ENT: No sinus tenderness, TM clear, pale nasal mucosa, no oral exudate,no post nasal drip, no LAN Cardiac: S1, S2, regular rate and rhythm, no murmur Chest: No wheeze/ rales/ dullness; no accessory muscle use, no nasal flaring, no sternal retractions Abd.: Soft Non-tender, nondistended, bowel sounds positive Ext: No clubbing cyanosis, edema Neuro:  normal strength, deconditioned at baseline Skin: No rashes, warm and dry Psych: normal mood and behavior   Assessment/Plan  Dyspnea on exertion Continued  dyspnea on exertion Pulmonary function tests indicate probable asthma with good bronchodilator response Plan We will try a therapeutic trial of Breo 100 Take this once daily  Every day. Rinse mouth after use. We will instruct you on use. We will also send in a prescription for a rescue inhaler. Use this for break through shortness of breath. Rescue inhaler should not be used more than 4 times daily. If you are using it frequently, please call the office so we can see you for possible flare. We will schedule a CT chest without contrast 07/2017 for nodule follow up. Follow up appointment in 4 weeks with Judson Roch of Dr. Halford Chessman Please contact office for sooner follow up if symptoms do not improve or worsen or seek emergency care    Pulmonary nodule Follow-up CT chest without contrast in 12 months to assess for stability    Magdalen Spatz, NP 08/04/2016  2:02 PM

## 2016-08-04 NOTE — Assessment & Plan Note (Signed)
Follow-up CT chest without contrast in 12 months to assess for stability

## 2016-08-04 NOTE — Patient Instructions (Addendum)
It is nice to meet you today. We will try a therapeutic trial of Breo 100 Take this once daily  Every day. Rinse mouth after use. We will instruct you on use. We will also send in a prescription for a rescue inhaler. Use this for break through shortness of breath. Rescue inhaler should not be used more than 4 times daily. If you are using it frequently, please call the office so we can see you for possible flare. We will schedule a CT chest without contrast 07/2017 for nodule follow up. Follow up appointment in 4 weeks with Judson Roch of Dr. Halford Chessman Please contact office for sooner follow up if symptoms do not improve or worsen or seek emergency care

## 2016-09-01 ENCOUNTER — Telehealth: Payer: Self-pay | Admitting: Acute Care

## 2016-09-01 MED ORDER — FLUTICASONE FUROATE-VILANTEROL 100-25 MCG/INH IN AEPB: 1.0000 | INHALATION_SPRAY | Freq: Every day | RESPIRATORY_TRACT | 0 refills | Status: AC

## 2016-09-01 NOTE — Telephone Encounter (Signed)
LMOM TCB x1 for Richardson Landry

## 2016-09-01 NOTE — Telephone Encounter (Signed)
Richardson Landry returned call - discussed that we are unable to keep patient stocked in samples until 7.19.18 appt with SG.  Richardson Landry concerned because this was to be a trial run for patient until her appt with SG next week, that in turn got cancelled because SG will not be in the office.  VS actually had a cancellation on Tuesday 6.26.18 @ 1630 Appt scheduled for this date/time w/ VS and 1 sample provided to patient Sample documented per protocol and placed up front for pickup at [place]  Nothing further needed; will sign off

## 2016-09-06 ENCOUNTER — Encounter: Payer: Self-pay | Admitting: Pulmonary Disease

## 2016-09-06 ENCOUNTER — Ambulatory Visit (INDEPENDENT_AMBULATORY_CARE_PROVIDER_SITE_OTHER): Payer: Medicare Other | Admitting: Pulmonary Disease

## 2016-09-06 VITALS — BP 126/74 | HR 63 | Ht 61.0 in | Wt 129.8 lb

## 2016-09-06 DIAGNOSIS — R0609 Other forms of dyspnea: Secondary | ICD-10-CM

## 2016-09-06 DIAGNOSIS — R29898 Other symptoms and signs involving the musculoskeletal system: Secondary | ICD-10-CM | POA: Diagnosis not present

## 2016-09-06 DIAGNOSIS — R911 Solitary pulmonary nodule: Secondary | ICD-10-CM | POA: Diagnosis not present

## 2016-09-06 NOTE — Progress Notes (Signed)
Current Outpatient Prescriptions on File Prior to Visit  Medication Sig  . bisoprolol (ZEBETA) 5 MG tablet Take 5 mg by mouth daily.  Marland Kitchen buPROPion (WELLBUTRIN SR) 100 MG 12 hr tablet   . CALCIUM PO Take 1 tablet by mouth daily.  . Cholecalciferol (VITAMIN D PO) Take 1 tablet by mouth daily.  . fish oil-omega-3 fatty acids 1000 MG capsule Take 2 g by mouth daily.    . hydrochlorothiazide (HYDRODIURIL) 12.5 MG tablet Take 12.5 mg by mouth daily.  . hyoscyamine (LEVSIN/SL) 0.125 MG SL tablet Place 1 tablet (0.125 mg total) under the tongue every 4 (four) hours as needed for cramping.  Marland Kitchen levothyroxine (SYNTHROID, LEVOTHROID) 25 MCG tablet Take 25 mcg by mouth daily.    . Multiple Vitamins-Minerals (MULTIVITAMIN WITH MINERALS) tablet Take 1 tablet by mouth daily.    Marland Kitchen Perphenazine-Amitriptyline 4-50 MG TABS Take 2 tablets by mouth at bedtime.    . rosuvastatin (CRESTOR) 10 MG tablet Take 1 tablet (10 mg total) by mouth at bedtime.  Marland Kitchen UNABLE TO FIND Med Name: Beet Root - 1 Capsule daily   No current facility-administered medications on file prior to visit.      Chief Complaint  Patient presents with  . Follow-up    pt here follow up from 4 weeks appt for dyspnea on exertion. Pt is SOB daily with exertion.  Concerned on the price of the Breo, Pt feels that it doesn't help her much at all with breathing. Pt is concerned and wants to know if she should go into pulm rehab? Pt's son stated that the pt is more fatigue than normal and more weak. It has worsen in last two weeks. The son is more concerned about the BP being so low and more concerned with the weakness as it worsened over last several weeks.      Pulmonary tests CT chest 07/12/16 >> atherosclerosis, 5 mm nodule RML, RUL tree in bud cluster, scoliosis PFT 07/12/16 >> FEV1 1.75 (118%), FEV1% 90, TLC 5.03 (111%), +BD, unable to do DLCO  Cardiac tests LHC 06/06/16 >> normal coronaries, EF 65%  Past medical history Arthritis, Colitis,  Depression, HLD, HTN, Osteoporosis, Hypothyroidism  Past surgical history, Family history, Social history, Allergies all reviewed.  Vital Signs BP 126/74 (BP Location: Left Arm, Cuff Size: Normal)   Pulse 63   Ht 5\' 1"  (1.549 m)   Wt 129 lb 12.8 oz (58.9 kg)   SpO2 99%   BMI 24.53 kg/m   History of Present Illness Rebecca Stanley is a 83 y.o. female with dyspnea.  She was tried on breo but didn't feel like this helped.  Her main concern is feeling weak.  She doesn't feel like she has stamina to do activities.  She is not having cough, wheeze, or chest congestion.  She does get reflux, but doesn't feel like she has issues with her throat.  She does feel like she gets wheeze from her throat sometimes, but this isn't frequent.  She denies chest pain, fever, or leg swelling.  She is still recovering form her back and hip procedures.    Physical Exam  General - pleasant Eyes - pupils reactive ENT - no sinus tenderness, no oral exudate, no LAN Cardiac - regular, no murmur Chest - no wheeze, rales Abd - soft, non tender Ext - no edema Skin - no rashes Neuro - normal strength Psych - normal mood    CMP Latest Ref Rng & Units 07/07/2016  Glucose 70 -  99 mg/dL 81  BUN 6 - 23 mg/dL 15  Creatinine 0.40 - 1.20 mg/dL 0.79  Sodium 135 - 145 mEq/L 132(L)  Potassium 3.5 - 5.1 mEq/L 4.0  Chloride 96 - 112 mEq/L 96  CO2 19 - 32 mEq/L 31  Calcium 8.4 - 10.5 mg/dL 10.1  Total Protein 6.0 - 8.3 g/dL 7.1  Total Bilirubin 0.2 - 1.2 mg/dL 0.5  Alkaline Phos 39 - 117 U/L 48  AST 0 - 37 U/L 26  ALT 0 - 35 U/L 18    CBC Latest Ref Rng & Units 07/07/2016 04/05/2010  WBC 4.0 - 10.5 K/uL 5.3 5.7  Hemoglobin 12.0 - 15.0 g/dL 12.4 12.2  Hematocrit 36.0 - 46.0 % 36.6 36.0  Platelets 150.0 - 400.0 K/uL 303.0 329.0     Assessment/Plan  Dyspnea on exertion. - her pulmonary and cardiac assessments to date have been relatively unremarkable - much of her symptoms might be related to  deconditioning - she would likely benefit from a monitored exercise program >> will arrange for referral to pulmonary rehab  Possible asthma. - I think her improvement after bronchodilator during PFT was likely more related to improved testing technique rather than true bronchodilator responsiveness - she can stop breo and monitor her symptoms  Lung nodule. - she will need f/u CT chest w/o contrast in May 2019   Patient Instructions  Can stop breo  Will arrange for referral to pulmonary rehab  Follow up in 6 months   Time spent 27 minutes  Chesley Mires, MD [facility] Pager:  [phone] 09/06/2016, 5:17 PM

## 2016-09-06 NOTE — Patient Instructions (Signed)
Can stop breo  Will arrange for referral to pulmonary rehab  Follow up in 6 months

## 2016-09-07 ENCOUNTER — Ambulatory Visit: Payer: Medicare Other | Admitting: Acute Care

## 2016-09-19 ENCOUNTER — Telehealth: Payer: Self-pay | Admitting: Pulmonary Disease

## 2016-09-19 DIAGNOSIS — R29898 Other symptoms and signs involving the musculoskeletal system: Secondary | ICD-10-CM

## 2016-09-19 DIAGNOSIS — R0602 Shortness of breath: Secondary | ICD-10-CM

## 2016-09-19 NOTE — Telephone Encounter (Signed)
lmtcb x1 for pt's son, Richardson Landry Referral to pulmonary rehab at [facility] has been placed-okay per VS verbally.

## 2016-09-19 NOTE — Telephone Encounter (Signed)
Spoke with son and he is aware of the previous message. He had no further questions at this time. Nothing further is needed

## 2016-09-19 NOTE — Telephone Encounter (Signed)
Patient is returning phone call.  °

## 2016-09-29 ENCOUNTER — Ambulatory Visit: Payer: Medicare Other | Admitting: Acute Care

## 2016-10-12 ENCOUNTER — Telehealth: Payer: Self-pay | Admitting: Pulmonary Disease

## 2016-10-12 NOTE — Telephone Encounter (Signed)
Called and spoke with pt and she stated that she was first told that her insurance would not approve this, but then they told her it was approved.  I advised her that she would need to call the facility in  to check with them and that they would be able to tell her if it has been approved. She will do this. Nothing further is needed.

## 2017-02-22 ENCOUNTER — Ambulatory Visit (INDEPENDENT_AMBULATORY_CARE_PROVIDER_SITE_OTHER): Payer: Medicare Other | Admitting: Gastroenterology

## 2017-02-22 ENCOUNTER — Encounter: Payer: Self-pay | Admitting: Gastroenterology

## 2017-02-22 VITALS — BP 132/60 | HR 72 | Ht 61.0 in | Wt 134.0 lb

## 2017-02-22 DIAGNOSIS — K52832 Lymphocytic colitis: Secondary | ICD-10-CM

## 2017-02-22 DIAGNOSIS — K529 Noninfective gastroenteritis and colitis, unspecified: Secondary | ICD-10-CM | POA: Diagnosis not present

## 2017-02-22 MED ORDER — BUDESONIDE 3 MG PO CPEP: 6.0000 mg | ORAL_CAPSULE | Freq: Every day | ORAL | 0 refills | Status: DC

## 2017-02-22 NOTE — Progress Notes (Signed)
[facility] Consult Note:  History: Rebecca Stanley 02/22/2017  Referring physician: Raina Stanley., MD  Reason for consult/chief complaint: Diarrhea (onset x 1 month, dark in color, no bleeding that she knows of, no antibotic use, no fever, no abdominal pain, has taken metronidazole 500mg  for 3 days that she had on hand)   Subjective  HPI:  This is an 83 year old woman referred by primary care noted above for recent onset diarrhea.  She was last seen by Dr. Maurene Stanley in 2012, after a diagnosis of lymphocytic colitis treated briefly with Entocort.  That resolved, and the patient returned to her previous pattern of constipation.  About a month ago she started having several small loose BMs with some urgency very soon after awakening in the morning.  As soon as she would wake up she would feel the urge need for a bowel movement, and then would go a few times.  After that she has no more bowel movements the rest of the day, no nocturnal bowel movements, no episodes of fecal soilage.  She has had no recent antibiotics or travel.  She had a recent contact with someone that she believes may have had a "stomach virus", but Rebecca Stanley did not get an infectious syndrome with fever vomiting abdominal pain prior to this diarrhea. Her son is with her, and describes multiple visits to the gerontologist at [facility] in the last few months.  They have been making changes in her psychiatric medications, trying to get her off perphenazine/amitriptyline. [name] denies rectal bleeding, abdominal pain, loss of appetite or weight loss.   ROS:  Review of Systems  Constitutional: Positive for fatigue. Negative for appetite change and unexpected weight change.  HENT: Negative for mouth sores and voice change.   Eyes: Negative for pain and redness.  Respiratory: Negative for cough and shortness of breath.   Cardiovascular: Negative for chest pain and palpitations.  Genitourinary: Negative for dysuria and  hematuria.  Musculoskeletal: Positive for arthralgias. Negative for myalgias.  Skin: Negative for pallor and rash.  Neurological: Negative for weakness and headaches.  Hematological: Negative for adenopathy.  Psychiatric/Behavioral: Positive for dysphoric mood. The patient is nervous/anxious.      Past Medical History: Past Medical History:  Diagnosis Date  . Allergy   . Arthritis   . Blood transfusion   . Colitis   . Colon polyp   . Depression   . Diverticulosis   . Heart murmur   . Hyperlipidemia   . Hypertension   . Mitral valve prolapse   . Osteoporosis   . Thyroid disease      Past Surgical History: Past Surgical History:  Procedure Laterality Date  . BELPHAROPTOSIS REPAIR    . bladder tack    . CATARACT EXTRACTION    . COLONOSCOPY    . DILATION AND CURETTAGE, DIAGNOSTIC / THERAPEUTIC    . gluteous medius tendon rupture     done at [facility]  . heel tendon repair    . HIP ARTHROSCOPY W/ LABRAL REPAIR    . kyploplasty     T12, L1, L2, L3, done at [facility]  . PARTIAL HYSTERECTOMY       Family History: Family History  Problem Relation Age of Onset  . Heart disease Mother   . Heart disease Unknown        aunt  . Diabetes Unknown        grandmother  . Breast cancer Unknown        aunt  .  Ovarian cancer Unknown        cousin  . Rheum arthritis Father   . Colon cancer Neg Hx     Social History: Social History   Socioeconomic History  . Marital status: Widowed    Spouse name: None  . Number of children: 1  . Years of education: None  . Highest education level: None  Social Needs  . Financial resource strain: None  . Food insecurity - worry: None  . Food insecurity - inability: None  . Transportation needs - medical: None  . Transportation needs - non-medical: None  Occupational History  . Occupation: retired  Tobacco Use  . Smoking status: Never Smoker  . Smokeless tobacco: Never Used  Substance and Sexual Activity  . Alcohol use: No  . Drug  use: No  . Sexual activity: None  Other Topics Concern  . None  Social History Narrative  . None    Allergies: Allergies  Allergen Reactions  . Lisinopril   . Losartan Swelling    Facial swelling    Outpatient Meds: Current Outpatient Medications  Medication Sig Dispense Refill  . bisoprolol (ZEBETA) 5 MG tablet Take 2.5 mg by mouth 2 (two) times daily.     Marland Kitchen CALCIUM PO Take 1 tablet by mouth daily.    . Cholecalciferol (VITAMIN D PO) Take 1 tablet by mouth daily.    . citalopram (CELEXA) 20 MG tablet Take 20 mg by mouth daily.    . fish oil-omega-3 fatty acids 1000 MG capsule Take 2 g by mouth daily.      Marland Kitchen levothyroxine (SYNTHROID, LEVOTHROID) 25 MCG tablet Take 37 mcg by mouth daily.     . Melatonin 5 MG TABS Take 1 tablet by mouth at bedtime.    . metroNIDAZOLE (FLAGYL) 500 MG tablet Take 500 mg by mouth daily.    . Multiple Vitamins-Minerals (MULTIVITAMIN WITH MINERALS) tablet Take 1 tablet by mouth daily.      Marland Kitchen Perphenazine-Amitriptyline 4-50 MG TABS Take 1 tablet by mouth at bedtime.     . budesonide (ENTOCORT EC) 3 MG 24 hr capsule Take 2 capsules (6 mg total) by mouth at bedtime. 60 capsule 0  . rosuvastatin (CRESTOR) 10 MG tablet Take 1 tablet (10 mg total) by mouth at bedtime. 30 tablet 11   No current facility-administered medications for this visit.       ___________________________________________________________________ Objective   Exam:  BP 132/60   Pulse 72   Ht 5\' 1"  (1.549 m)   Wt 134 lb (60.8 kg)   BMI 25.32 kg/m  Her son Rebecca Stanley is present for the entire encounter  General: this is a(n)  elderly woman with a restricted affect, antalgic gait and gets on exam table with minimal assistance.  Eyes: sclera anicteric, no redness  ENT: oral mucosa moist without lesions, no cervical or supraclavicular lymphadenopathy, good dentition  CV: RRR without murmur, S1/S2, no JVD, no peripheral edema  Resp: clear to auscultation bilaterally, normal RR  and effort noted  GI: soft, no tenderness, with active bowel sounds. No guarding or palpable organomegaly noted.  Skin; warm and dry, no rash or jaundice noted  Neuro: awake, alert and oriented x 3. Normal gross motor function and fluent speech  Labs:  No recent labs or imaging  Assessment: Encounter Diagnoses  Name Primary?  . Chronic diarrhea Yes  . Lymphocytic colitis     It is not clear what is prompted this recent change in bowel habits.  Its pattern  of a few episodes very soon after awakening sounds more functional than microscopic colitis.  It also does not sound infectious.  Plan:  I have put her on a short trial of budesonide 3mg  tablets, 2 tablets every night at bedtime.  I tried to prescribe Uceris, but benefits indication was that it would not be covered. We will do this for several weeks.  If she is improved, then we can discuss weaning off that medicine over a few months.  If she is not improved, then I think this is probably related to recent change in her psych meds and I would manage it expectantly. She or her son will contact us in several weeks with an update.  Thank you for the courtesy of this consult.  Please call me with any questions or concerns.  Nelida Meuse III  CC: Rebecca Stanley., MD

## 2017-02-22 NOTE — Patient Instructions (Signed)
If you are age 81 or older, your body mass index should be between 23-30. Your Body mass index is 25.32 kg/m. If this is out of the aforementioned range listed, please consider follow up with your Primary Care Provider.  If you are age 80 or younger, your body mass index should be between 19-25. Your Body mass index is 25.32 kg/m. If this is out of the aformentioned range listed, please consider follow up with your Primary Care Provider.   Thank you for choosing [facility]  Dr Wilfrid Lund III

## 2017-02-24 ENCOUNTER — Ambulatory Visit (INDEPENDENT_AMBULATORY_CARE_PROVIDER_SITE_OTHER): Payer: Medicare Other | Admitting: Emergency Medicine

## 2017-02-24 ENCOUNTER — Encounter: Payer: Self-pay | Admitting: Emergency Medicine

## 2017-02-24 DIAGNOSIS — R911 Solitary pulmonary nodule: Secondary | ICD-10-CM

## 2017-02-24 NOTE — Progress Notes (Signed)
Current Outpatient Medications on File Prior to Visit  Medication Sig  . bisoprolol (ZEBETA) 5 MG tablet Take 5 mg by mouth daily.  Marland Kitchen CALCIUM PO Take 1 tablet by mouth daily.  . Cholecalciferol (VITAMIN D PO) Take 1 tablet by mouth daily.  . citalopram (CELEXA) 20 MG tablet Take 20 mg by mouth daily.  . fish oil-omega-3 fatty acids 1000 MG capsule Take 2 g by mouth daily.    Marland Kitchen levothyroxine (SYNTHROID, LEVOTHROID) 25 MCG tablet Take 37 mcg by mouth daily.   . Melatonin 5 MG TABS Take 1 tablet by mouth at bedtime.  . Multiple Vitamins-Minerals (MULTIVITAMIN WITH MINERALS) tablet Take 1 tablet by mouth daily.    Marland Kitchen Perphenazine-Amitriptyline 4-50 MG TABS Take 1 tablet by mouth at bedtime.    No current facility-administered medications on file prior to visit.      Chief Complaint  Patient presents with  . Follow-up    follow up, just finished pulm therapy 2 weeks ago,    HPI: 83 yo woman followed by Dr Halford Chessman for dyspnea and also for micronodular disease on Ct scan of the chest. She was scheduled with me in error - expected to follow with Dr Halford Chessman. She reports that pulmonary rehab has been great for her. She has benefited - better exercise tolerance, less dyspnea. She has enrolled in the maintenance program so that she can continue.   Pulmonary tests CT chest 07/12/16 >> atherosclerosis, 5 mm nodule RML, RUL tree in bud cluster, scoliosis PFT 07/12/16 >> FEV1 1.75 (118%), FEV1% 90, TLC 5.03 (111%), +BD, unable to do DLCO  Cardiac tests LHC 06/06/16 >> normal coronaries, EF 65%  Past medical history Arthritis, Colitis, Depression, HLD, HTN, Osteoporosis, Hypothyroidism  Past surgical history, Family history, Social history, Allergies all reviewed.  Vital Signs BP 106/60 (BP Location: Right Arm, Cuff Size: Normal)   Pulse (!) 51   Ht 5\' 1"  (1.549 m)   Wt 134 lb 12.8 oz (61.1 kg)   SpO2 96%   BMI 25.47 kg/m     Physical Exam Vitals:   02/24/17 1616  BP: 106/60  Pulse: (!) 51   SpO2: 96%  Weight: 134 lb 12.8 oz (61.1 kg)  Height: 5\' 1"  (1.549 m)   Exam deferred today.     CMP Latest Ref Rng & Units 07/07/2016  Glucose 70 - 99 mg/dL 81  BUN 6 - 23 mg/dL 15  Creatinine 0.40 - 1.20 mg/dL 0.79  Sodium 135 - 145 mEq/L 132(L)  Potassium 3.5 - 5.1 mEq/L 4.0  Chloride 96 - 112 mEq/L 96  CO2 19 - 32 mEq/L 31  Calcium 8.4 - 10.5 mg/dL 10.1  Total Protein 6.0 - 8.3 g/dL 7.1  Total Bilirubin 0.2 - 1.2 mg/dL 0.5  Alkaline Phos 39 - 117 U/L 48  AST 0 - 37 U/L 26  ALT 0 - 35 U/L 18    CBC Latest Ref Rng & Units 07/07/2016 04/05/2010  WBC 4.0 - 10.5 K/uL 5.3 5.7  Hemoglobin 12.0 - 15.0 g/dL 12.4 12.2  Hematocrit 36.0 - 46.0 % 36.6 36.0  Platelets 150.0 - 400.0 K/uL 303.0 329.0     Assessment/Plan  Pt has exertional SOB that appears to be largely due to deconditioning. She is mistakenly on my schedule, has no acute complaints. She is benefiting significantly from pulm rehab and I supported her plans to do the maintenance program. Not on BD's. Will as her to continue with Dr Juanetta Gosling plans.   She has micronodular  disease on Ct chest, last was May 2018. I will order a repeat for May 2019 with plan for her to follow with Dr Halford Chessman after to review.    No Charge today.     Patient Instructions  We will arrange for your repeat CT scan of the chest to be done in May 2019 Continue your current medications.  Follow with Dr Halford Chessman in May to review your CT together.    Baltazar Apo, MD, PhD 02/24/2017, 4:44 PM [facility] [phone] or if no answer [phone]

## 2017-02-24 NOTE — Patient Instructions (Signed)
We will arrange for your repeat CT scan of the chest to be done in May 2019 Continue your current medications.  Follow with Dr Halford Chessman in May to review your CT together.

## 2017-03-13 ENCOUNTER — Telehealth: Payer: Self-pay | Admitting: Gastroenterology

## 2017-03-13 NOTE — Telephone Encounter (Signed)
Started budesonide and within 2 weeks she began having headaches and hypertension with pain in her neck.  She is on 2 budesonide at bedtime.  BP 180/57 last night so she did not take any.  Stools are much better.  The pt was advised to stop budesonide until Dr Loletha Carrow is back in the office on Thursday to review.  She will wait for further instructions on Thursday.  (consulted with Ellouise Newer regarding stopping budesonide)  Please advise

## 2017-03-16 NOTE — Telephone Encounter (Signed)
Sorry to hear that. Sounds like the budesonide does not agree with her.  Let's try 1 imodium tablet every morning when she wakes up.  Can increase to 2 tablets if needed.  If that does not seem to help much, we can try a different med in case this is the microscopic colitis.  - HD

## 2017-03-17 NOTE — Telephone Encounter (Signed)
Spoke to patient, she has stopped taking the budesonide. She will try the imodium to see if that helps. Patient instructed to start with one tablet every morning, may increase to two if needed. She understands to contact office if this does not seem to be helping.

## 2017-05-12 ENCOUNTER — Ambulatory Visit (INDEPENDENT_AMBULATORY_CARE_PROVIDER_SITE_OTHER): Payer: Medicare Other | Admitting: Pulmonary Disease

## 2017-05-12 ENCOUNTER — Encounter: Payer: Self-pay | Admitting: Pulmonary Disease

## 2017-05-12 VITALS — BP 118/60 | HR 49 | Temp 98.1°F | Ht 60.0 in | Wt 134.6 lb

## 2017-05-12 DIAGNOSIS — R911 Solitary pulmonary nodule: Secondary | ICD-10-CM

## 2017-05-12 DIAGNOSIS — R0609 Other forms of dyspnea: Secondary | ICD-10-CM | POA: Diagnosis not present

## 2017-05-12 NOTE — Patient Instructions (Signed)
Follow up in 6 months 

## 2017-05-12 NOTE — Progress Notes (Signed)
[facility]  Chief Complaint  Patient presents with  . Acute Visit    SOB all the time increased with activity alitte dizzy     Vital signs: BP 118/60 (BP Location: Left Arm, Cuff Size: Normal)   Pulse (!) 49   Temp 98.1 F (36.7 C) (Oral)   Ht 5' (1.524 m)   Wt 134 lb 9.6 oz (61.1 kg)   SpO2 97%   BMI 26.29 kg/m   History of Present Illness: Rebecca Stanley is a 84 y.o. female with dyspnea.  She was in hospital recently for hyponatremia.  This happened after she was treated for a tooth infection.  She was drinking lots, but wasn't able to eat much.  Sodium is better.  She was doing pulmonary rehab and doing well with this  Prior to her illness.  She is getting more short of breath with activities since she was in hospital.  She denies cough, wheeze, sputum, chest pain, or edema.  Her heart rate has been running low, and she was told her blood pressure runs low at times.  Physical Exam:  General - pleasant Eyes - pupils reactive, wears glasses ENT - no sinus tenderness, no oral exudate, no LAN Cardiac - regular, no murmur Chest - no wheeze, rales Abd - soft, non tender Ext - no edema Skin - no rashes Neuro - normal strength Psych - normal mood  Discussion: She has progression of her symptoms of dyspnea since her recent hospital stay.  I think this is related to deconditioning.  She could also have chronotropic insufficiency related to beta blocker use.  I have not found a specific pulmonary condition that would explain her symptoms.  Assessment/Plan:  Dyspnea on exertion. - f/u with PCP and cardiology to discuss whether she can come off of bisoprolol with concern for chronotropic insufficiency - continue PT  Lung nodule. - stable on most recent CT chest - no additional radiographic follow up needed  Recent episode of hyponatremia. - from description sounds like she had "tea and toast" time hyponatremia in setting of dental  infection   Patient Instructions  Follow up in 6 months    Chesley Mires, MD [place] 05/12/2017, 10:06 AM Pager:  [phone]  Flow Sheet  Pulmonary tests: CT chest 07/12/16 >> atherosclerosis, 5 mm nodule RML, RUL tree in bud cluster, scoliosis PFT 07/12/16 >> FEV1 1.75 (118%), FEV1% 90, TLC 5.03 (111%), +BD, unable to do DLCO CT angio chest 04/18/17 >> 6 mm nodule RML, 4 mm nodule RLL  Cardiac tests LHC 06/06/16 >> normal coronaries, EF 65%  Past Medical History: She  has a past medical history of Allergy, Arthritis, Blood transfusion, Colitis, Colon polyp, Depression, Diverticulosis, Heart murmur, Hyperlipidemia, Hypertension, Mitral valve prolapse, Osteoporosis, and Thyroid disease.  Past Surgical History: She  has a past surgical history that includes heel tendon repair; Colonoscopy; Hip arthroscopy w/ labral repair; bladder tack; Partial hysterectomy; Dilation and curettage, diagnostic / therapeutic; Cataract extraction; Blepharoptosis repair; kyploplasty; and gluteous medius tendon rupture.  Family History: Her family history includes Breast cancer in her unknown relative; Diabetes in her unknown relative; Heart disease in her mother and unknown relative; Ovarian cancer in her unknown relative; Rheum arthritis in her father.  Social History: She  reports that  has never smoked. she has never used smokeless tobacco. She reports that she does not drink alcohol or use drugs.  Medications: Allergies as of 05/12/2017      Reactions  Lisinopril    Losartan Swelling   Facial swelling      Medication List        Accurate as of 05/12/17 10:06 AM. Always use your most recent med list.          amLODipine 2.5 MG tablet Commonly known as:  NORVASC Take 2.5 mg by mouth daily.   bisoprolol 5 MG tablet Commonly known as:  ZEBETA Take 5 mg by mouth daily.   CALCIUM PO Take 1 tablet by mouth daily.   citalopram 20 MG tablet Commonly known as:   CELEXA Take 20 mg by mouth daily.   fish oil-omega-3 fatty acids 1000 MG capsule Take 2 g by mouth daily.   levothyroxine 25 MCG tablet Commonly known as:  SYNTHROID, LEVOTHROID Take 37 mcg by mouth daily.   Melatonin 5 MG Tabs Take 1 tablet by mouth at bedtime.   multivitamin with minerals tablet Take 1 tablet by mouth daily.   Perphenazine-Amitriptyline 4-50 MG Tabs Take 1 tablet by mouth at bedtime.   VITAMIN D PO Take 1 tablet by mouth daily.

## 2017-06-19 ENCOUNTER — Telehealth: Payer: Self-pay | Admitting: Pulmonary Disease

## 2017-06-19 NOTE — Telephone Encounter (Signed)
Okay to cancel CT in May

## 2017-06-19 NOTE — Telephone Encounter (Signed)
Notified Stacey at [facility] CT letting her know we could cancel pt's CT that is scheduled to be done in May.  Stated to Erline Levine I was going to make pt aware that this was being done.  Called pt letting her know CT was not needed to be done in May and that we were cancelling that scan.  Pt expressed understanding. Nothing further needed at this time.

## 2017-06-19 NOTE — Telephone Encounter (Signed)
Dr. Halford Chessman, please advise if you are still needing pt to have CT which is currently scheduled for 5/13 as pt recently had a CT angio done at [facility] 04/2017 or if we can cancel that CT.  Thanks!

## 2017-07-10 ENCOUNTER — Ambulatory Visit (INDEPENDENT_AMBULATORY_CARE_PROVIDER_SITE_OTHER): Payer: Medicare Other | Admitting: Internal Medicine

## 2017-07-10 ENCOUNTER — Encounter: Payer: Self-pay | Admitting: Internal Medicine

## 2017-07-10 VITALS — BP 128/70 | HR 67 | Ht 60.0 in | Wt 135.6 lb

## 2017-07-10 DIAGNOSIS — R5382 Chronic fatigue, unspecified: Secondary | ICD-10-CM | POA: Diagnosis not present

## 2017-07-10 DIAGNOSIS — I10 Essential (primary) hypertension: Secondary | ICD-10-CM | POA: Diagnosis not present

## 2017-07-10 NOTE — Patient Instructions (Addendum)
Medication Instructions:  STOP BISOPROLOL   -- If you need a refill on your cardiac medications before your next appointment, please call your pharmacy. --  Labwork: None ordered  Testing/Procedures: None ordered  Follow-Up: Your physician wants you to follow-up [name] with Dr. Saunders Revel.     Thank you for choosing [facility]!!    Any Other Special Instructions Will Be Listed Below (If Applicable).

## 2017-07-10 NOTE — Progress Notes (Signed)
New Outpatient Visit Date: 07/10/2017  Referring Provider: Raina Mina., MD [address] [place], [place] [address]  Chief Complaint: Fatigue  HPI:  Ms. Zender is a 84 y.o. female who is being seen today for the evaluation of fatigue. She has a history of mitral valve prolapse, hypertension, and hyperlipidemia.  She previously followed with Dr. Lennox Pippins, and last saw him in 06/3016 when he was still part of [facility].  She was also evaluated by Dr. Otho Perl [facility] cardiology) in [place] last month.  No clear etiology for her fatigue has been identified, though there has been concern over low heart rates prompting recent de-escalation of bisoprolol by her PCP.  Ms. Funaro was also hospitalized in February due to worsening fatigue and lethargy.  She was found to have marked hyponatremia.  Her energy has improved somewhat, though she still feels tired on a daily basis.  The symptoms have prompted multiple tests in the past, including cardiac catheterization last year.  Ms. Buck denies chest pain, shortness of breath, palpitations, and lightheadedness.  She notes occasional dependent edema but avoids taking a fluid pill as it previously "wiped her out."  Ms. Ates does not sleep well at night.  She has never undergone a sleep study.  --------------------------------------------------------------------------------------------------  Cardiovascular History & Procedures: Cardiovascular Problems:  Mitral valve prolapse  Chronic fatigue  Risk Factors:  Hypertension, hyperlipidemia, age > 41  Cath/PCI:  LHC (06/06/16, [facility]): Normal coronary arteries.  LVEF 65%.  LVEDP 9 mmHg.  CV Surgery:  None  EP Procedures and Devices:  None  Non-Invasive Evaluation(s):  TTE (08/19/10): Normal LV size and systolic function.  Impaired relaxation noted.  Normal RV size and function.  Mild TR.  Recent CV Pertinent Labs: Lab Results  Component Value Date   K 4.0  07/07/2016   BUN 15 07/07/2016   CREATININE 0.79 07/07/2016    --------------------------------------------------------------------------------------------------  Past Medical History:  Diagnosis Date  . Allergy   . Arthritis   . Blood transfusion   . Colitis   . Colon polyp   . Depression   . Diverticulosis   . Heart murmur   . Hyperlipidemia   . Hypertension   . Mitral valve prolapse   . Osteoporosis   . Thyroid disease     Past Surgical History:  Procedure Laterality Date  . BELPHAROPTOSIS REPAIR    . bladder tack    . CATARACT EXTRACTION    . COLONOSCOPY    . DILATION AND CURETTAGE, DIAGNOSTIC / THERAPEUTIC    . gluteous medius tendon rupture     done at [facility]  . heel tendon repair    . HIP ARTHROSCOPY W/ LABRAL REPAIR    . kyploplasty     T12, L1, L2, L3, done at [facility]  . PARTIAL HYSTERECTOMY      Current Meds  Medication Sig  . amLODipine (NORVASC) 2.5 MG tablet Take 2.5 mg by mouth daily.  . bisoprolol (ZEBETA) 5 MG tablet Take 2.5 mg by mouth daily.   Marland Kitchen CALCIUM PO Take 1 tablet by mouth daily.  . Cholecalciferol (VITAMIN D PO) Take 1 tablet by mouth daily.  . citalopram (CELEXA) 20 MG tablet Take 20 mg by mouth daily.  . fish oil-omega-3 fatty acids 1000 MG capsule Take 2 g by mouth daily.    Marland Kitchen levothyroxine (SYNTHROID, LEVOTHROID) 25 MCG tablet Take 62.5 mcg by mouth daily.   . Melatonin 5 MG TABS Take 1 tablet by mouth at  bedtime.  . Multiple Vitamins-Minerals (MULTIVITAMIN WITH MINERALS) tablet Take 1 tablet by mouth daily.    Marland Kitchen Perphenazine-Amitriptyline 4-50 MG TABS Take 1.5 tablets by mouth at bedtime.     Allergies: Lisinopril and Losartan  Social History   Tobacco Use  . Smoking status: Never Smoker  . Smokeless tobacco: Never Used  Substance Use Topics  . Alcohol use: No  . Drug use: No    Family History  Problem Relation Age of Onset  . Heart disease Mother   . Heart disease Unknown        aunt  . Diabetes Unknown         grandmother  . Breast cancer Unknown        aunt  . Ovarian cancer Unknown        cousin  . Rheum arthritis Father   . Colon cancer Neg Hx     Review of Systems: Review of Systems  Constitutional: Positive for malaise/fatigue.  HENT: Negative.   Eyes: Negative.   Respiratory: Positive for shortness of breath (with activity - chronic).   Cardiovascular: Negative.   Gastrointestinal: Positive for constipation.  Genitourinary: Negative.   Musculoskeletal: Positive for back pain (s/p multiple kyphoplasties).  Skin: Negative.   Neurological: Positive for weakness (balance problems).  Endo/Heme/Allergies: Negative.   Psychiatric/Behavioral: Positive for depression.   --------------------------------------------------------------------------------------------------  Physical Exam: BP 128/70   Pulse 67   Ht 5' (1.524 m)   Wt 135 lb 9.6 oz (61.5 kg)   BMI 26.48 kg/m   General: Well-developed, well-nourished elderly woman, seated comfortably in the exam room.  She is accompanied by her son, who is also a patient of mine. HEENT: No conjunctival pallor or scleral icterus. Moist mucous membranes. OP clear. Neck: Supple without lymphadenopathy, thyromegaly, JVD, or HJR. No carotid bruit. Lungs: Normal work of breathing. Clear to auscultation bilaterally without wheezes or crackles. Heart: Regular rate and rhythm without murmurs, rubs, or gallops. Non-displaced PMI. Abd: Bowel sounds present. Soft, NT/ND without hepatosplenomegaly Ext: No lower extremity edema. Radial, PT, and DP pulses are 2+ bilaterally Skin: Warm and dry without rash. Neuro: CNIII-XII intact. Strength and fine-touch sensation intact in upper and lower extremities bilaterally. Psych: Normal mood and affect.  EKG: Normal sinus rhythm without abnormalities.  Heart rate 67 bpm.  Lab Results  Component Value Date   WBC 5.3 07/07/2016   HGB 12.4 07/07/2016   HCT 36.6 07/07/2016   MCV 88.0 07/07/2016   PLT 303.0  07/07/2016    Lab Results  Component Value Date   NA 132 (L) 07/07/2016   K 4.0 07/07/2016   CL 96 07/07/2016   CO2 31 07/07/2016   BUN 15 07/07/2016   CREATININE 0.79 07/07/2016   GLUCOSE 81 07/07/2016   ALT 18 07/07/2016    No results found for: CHOL, HDL, LDLCALC, LDLDIRECT, TRIG, CHOLHDL   --------------------------------------------------------------------------------------------------  ASSESSMENT AND PLAN: Chronic fatigue This could be due to any number of factors, many of which are noncardiac.  Exam and EKG today are normal.  Ms. Howland has also been evaluated by multiple cardiologists over the years and underwent cardiac catheterization last yearwithout clear explanation for her fatigue.  It is possible that medication side effects are contributing to some of her fatigue.  Though she is no longer bradycardic following de-escalation of bisoprolol, I think it is reasonable to discontinue this altogether to see if she has some improvement in her symptoms.  It is also possible that perphenazine-amitriptyline is contributing to some  of her symptoms.  However, Ms. Burkman son reports that other providers have been unable to wean her off of this due to insomnia.  Referral to a sleep specialist may also be helpful to exclude sleep apnea or some other underlying sleep disturbance.  Repeat echo is also a consideration if no other cause for her fatigue is identified.  Hypertension Blood pressure is normal today.  Is reasonable to continue with amlodipine 2.5 mg daily.  We will stop bisoprolol, as above.  I have asked Ms. Husted to let us know if her blood pressure climbs and is consistently above 140/90.  Follow-up: Return to clinic in early June, 2019.  [name], MD 07/10/2017 4:24 PM

## 2017-07-11 ENCOUNTER — Telehealth: Payer: Self-pay | Admitting: Internal Medicine

## 2017-07-11 ENCOUNTER — Encounter: Payer: Self-pay | Admitting: Internal Medicine

## 2017-07-11 DIAGNOSIS — I10 Essential (primary) hypertension: Secondary | ICD-10-CM | POA: Insufficient documentation

## 2017-07-11 NOTE — Telephone Encounter (Signed)
New message   Patient's son Rebecca Stanley (DPR on file) returning call. Please call [phone]

## 2017-07-11 NOTE — Telephone Encounter (Signed)
Spoke to patient's son regarding his mother's appointment.

## 2017-07-24 ENCOUNTER — Other Ambulatory Visit: Payer: Medicare Other

## 2017-08-04 ENCOUNTER — Encounter: Payer: Self-pay | Admitting: Pulmonary Disease

## 2017-08-04 ENCOUNTER — Ambulatory Visit (INDEPENDENT_AMBULATORY_CARE_PROVIDER_SITE_OTHER): Payer: Medicare Other | Admitting: Pulmonary Disease

## 2017-08-04 VITALS — BP 120/82 | HR 94 | Ht 61.0 in | Wt 132.0 lb

## 2017-08-04 DIAGNOSIS — R0609 Other forms of dyspnea: Secondary | ICD-10-CM

## 2017-08-04 NOTE — Progress Notes (Signed)
Pulmonary, Critical Care, and Sleep Medicine  Chief Complaint  Patient presents with  . Follow-up    increased SOB all the time, denies chest tightnessm discomfort, or wheezing.     Vital signs: BP 120/82 (BP Location: Left Arm, Cuff Size: Normal)   Pulse 94   Ht 5\' 1"  (1.549 m)   Wt 132 lb (59.9 kg)   SpO2 98%   BMI 24.94 kg/m   History of Present Illness: Rebecca Stanley is a 84 y.o. female with dyspnea.  She still gets winded.  She had heart medications changed and heart rate improved.  She is not having cough, wheeze, sputum, chest pain, hemoptysis, fever, or swelling.  She is not very active.  She feels fatigued and has no energy.  She had lab testing recently that showed cortisol 5.5.  Physical Exam:  General - pleasant Eyes - pupils reactive, wears glasses ENT - no sinus tenderness, no oral exudate, no LAN Cardiac - regular, no murmur Chest - no wheeze, rales Abd - soft, non tender Ext - no edema Skin - no rashes Neuro - normal strength Psych - normal mood   Discussion: She has persistent symptoms of dyspnea.  She had some improvement after coming off off beta blocker medication with improvement in heart rate.  She was found to have low cortisol level and adrenal insufficiency could explain some of her current symptoms.  I suspect some of her current symptoms are also related to deconditioning with sedentary lifestyle.  Assessment/Plan:  Dyspnea on exertion. - recommended starting gradual exercise regimen - don't think she needs additional pulmonary testing at this time  Possible adrenal insufficiency. - she is to f/u with [facility]ocrinology at [facility]   Patient Instructions  Follow up in 6 months   Chesley Mires, MD [place] 08/04/2017, 5:00 PM Pager:  [phone]  Flow Sheet  Pulmonary tests: CT chest 07/12/16 >> atherosclerosis, 5 mm nodule RML, RUL tree in bud cluster, scoliosis PFT 07/12/16 >> FEV1 1.75 (118%), FEV1% 90, TLC  5.03 (111%), +BD, unable to do DLCO CT angio chest 04/18/17 >> 6 mm nodule RML, 4 mm nodule RLL  Cardiac tests LHC 06/06/16 >> normal coronaries, EF 65%  Past Medical History: She  has a past medical history of Allergy, Arthritis, Blood transfusion, Colitis, Colon polyp, Depression, Diverticulosis, Heart murmur, Hyperlipidemia, Hypertension, Mitral valve prolapse, Osteoporosis, and Thyroid disease.  Past Surgical History: She  has a past surgical history that includes heel tendon repair; Colonoscopy; Hip arthroscopy w/ labral repair; bladder tack; Partial hysterectomy; Dilation and curettage, diagnostic / therapeutic; Cataract extraction; Blepharoptosis repair; kyploplasty; and gluteous medius tendon rupture.  Family History: Her family history includes Breast cancer in her unknown relative; Diabetes in her unknown relative; Heart disease in her mother and unknown relative; Ovarian cancer in her unknown relative; Rheum arthritis in her father.  Social History: She  reports that she has never smoked. She has never used smokeless tobacco. She reports that she does not drink alcohol or use drugs.  Medications: Allergies as of 08/04/2017      Reactions   Lisinopril    Losartan Swelling   Facial swelling      Medication List        Accurate as of 08/04/17  5:00 PM. Always use your most recent med list.          amLODipine 2.5 MG tablet Commonly known as:  NORVASC Take 2.5 mg by mouth daily.   CALCIUM PO Take 1 tablet  by mouth daily.   citalopram 20 MG tablet Commonly known as:  CELEXA Take 20 mg by mouth daily.   fish oil-omega-3 fatty acids 1000 MG capsule Take 2 g by mouth daily.   levothyroxine 25 MCG tablet Commonly known as:  SYNTHROID, LEVOTHROID Take 62.5 mcg by mouth daily.   Melatonin 5 MG Tabs Take 1 tablet by mouth at bedtime.   multivitamin with minerals tablet Take 1 tablet by mouth daily.   Perphenazine-Amitriptyline 4-50 MG Tabs Take 1.5 tablets by  mouth at bedtime.   VITAMIN D PO Take 1 tablet by mouth daily.

## 2017-08-04 NOTE — Patient Instructions (Signed)
Follow up in 6 months 

## 2017-08-17 ENCOUNTER — Encounter: Payer: Self-pay | Admitting: Internal Medicine

## 2017-08-17 ENCOUNTER — Ambulatory Visit (INDEPENDENT_AMBULATORY_CARE_PROVIDER_SITE_OTHER): Payer: Medicare Other | Admitting: Internal Medicine

## 2017-08-17 VITALS — BP 120/72 | HR 78 | Ht 60.0 in | Wt 139.6 lb

## 2017-08-17 DIAGNOSIS — R5382 Chronic fatigue, unspecified: Secondary | ICD-10-CM

## 2017-08-17 DIAGNOSIS — I10 Essential (primary) hypertension: Secondary | ICD-10-CM

## 2017-08-17 NOTE — Patient Instructions (Addendum)
Medication Instructions:  Your physician recommends that you continue on your current medications as directed. Please refer to the Current Medication list given to you today.  -- If you need a refill on your cardiac medications before your next appointment, please call your pharmacy. --  Labwork: None ordered  Testing/Procedures: None ordered  Follow-Up: Your physician wants you to follow-up in: 6 months with Dr. End.    You will receive a reminder letter in the mail two months in advance. If you don't receive a letter, please call our office to schedule the follow-up appointment.  Thank you for choosing [facility]!!    Any Other Special Instructions Will Be Listed Below (If Applicable).         

## 2017-08-17 NOTE — Progress Notes (Signed)
Follow-up Outpatient Visit Date: 08/17/2017  Primary Care Provider: Raina Mina., MD [address] [place] [address]  Chief Complaint: Fatigue  HPI:  Ms. Hulick is a 84 y.o. year-old female with history of mitral valve prolapse, HTN, and HLD, who presents for follow-up of fatigue and shortness of breath.  I met her in late April, at which time she was concerned about long-standing fatigue.  She had undergone extensive work-up by several cardiologists in the area.  We agreed to discontinue bisoprolol to see if that would improve her symptoms.  She was subsequently evaluated by Dr. Halford Chessman in the pulmonary clinic.  She noted some improvement in her symptoms with cessation of bisoprolol.  She was also noted to have low cortisol level, to be managed by endocrinology.  Today, Ms. Meiser notes that her fatigue has improved with discontinuation of bisoprolol.  She was able to travel to [place] and spend most the day shopping with friends yesterday.  However, she continues to feel generally weak, particularly in her legs.  She denies chest pain and has stable exertional dyspnea.  She denies palpitations, lightheadedness, orthopnea, and edema.  Ms. Suares son reports that Dr. Bea Graff attempted to wean citalopram without success.  She is back on her prior dose, with the addition of aripiprazole.  --------------------------------------------------------------------------------------------------  Cardiovascular History & Procedures: Cardiovascular Problems:  Mitral valve prolapse  Chronic fatigue  Risk Factors:  Hypertension, hyperlipidemia, age > 41  Cath/PCI:  LHC (06/06/16, [facility]): Normal coronary arteries.  LVEF 65%.  LVEDP 9 mmHg.  CV Surgery:  None  EP Procedures and Devices:  None  Non-Invasive Evaluation(s):  TTE (08/19/10): Normal LV size and systolic function.  Impaired relaxation noted.  Normal RV size and function.  Mild TR.  Recent CV Pertinent  Labs: Lab Results  Component Value Date   K 4.0 07/07/2016   BUN 15 07/07/2016   CREATININE 0.79 07/07/2016    Past medical and surgical history were reviewed and updated in EPIC.  Current Meds  Medication Sig  . amLODipine (NORVASC) 2.5 MG tablet Take 2.5 mg by mouth daily.  . ARIPIPRAZOLE PO Take 2 mg by mouth daily at 8 pm.  . CALCIUM PO Take 1 tablet by mouth daily.  . Cholecalciferol (VITAMIN D PO) Take 1 tablet by mouth daily.  . citalopram (CELEXA) 20 MG tablet Take 20 mg by mouth daily.  . fish oil-omega-3 fatty acids 1000 MG capsule Take 2 g by mouth daily.    Marland Kitchen levothyroxine (SYNTHROID, LEVOTHROID) 25 MCG tablet Take 62.5 mcg by mouth daily.   . Multiple Vitamins-Minerals (MULTIVITAMIN WITH MINERALS) tablet Take 1 tablet by mouth daily.    Marland Kitchen Perphenazine-Amitriptyline 4-50 MG TABS Take 1.5 tablets by mouth at bedtime.     Allergies: Losartan and Lisinopril  Social History   Tobacco Use  . Smoking status: Never Smoker  . Smokeless tobacco: Never Used  Substance Use Topics  . Alcohol use: No  . Drug use: No    Family History  Problem Relation Age of Onset  . Heart disease Mother   . Heart disease Unknown        aunt  . Diabetes Unknown        grandmother  . Breast cancer Unknown        aunt  . Ovarian cancer Unknown        cousin  . Rheum arthritis Father   . Colon cancer Neg Hx     Review of  Systems: A 12-system review of systems was performed and was negative except as noted in the HPI.  --------------------------------------------------------------------------------------------------  Physical Exam: BP 120/72   Pulse 78   Ht 5' (1.524 m)   Wt 139 lb 9.6 oz (63.3 kg)   SpO2 94%   BMI 27.26 kg/m   General:  NAD.  Accompanied by her son. HEENT: No conjunctival pallor or scleral icterus. Moist mucous membranes.  OP clear. Neck: Supple without lymphadenopathy, thyromegaly, JVD, or HJR. Lungs: Normal work of breathing. Clear to auscultation  bilaterally without wheezes or crackles. Heart: Regular rate and rhythm without murmurs, rubs, or gallops. Non-displaced PMI. Abd: Bowel sounds present. Soft, NT/ND without hepatosplenomegaly Ext: No lower extremity edema. Skin: Warm and dry without rash.   Lab Results  Component Value Date   WBC 5.3 07/07/2016   HGB 12.4 07/07/2016   HCT 36.6 07/07/2016   MCV 88.0 07/07/2016   PLT 303.0 07/07/2016    Lab Results  Component Value Date   NA 132 (L) 07/07/2016   K 4.0 07/07/2016   CL 96 07/07/2016   CO2 31 07/07/2016   BUN 15 07/07/2016   CREATININE 0.79 07/07/2016   GLUCOSE 81 07/07/2016   ALT 18 07/07/2016    No results found for: CHOL, HDL, LDLCALC, LDLDIRECT, TRIG, CHOLHDL  --------------------------------------------------------------------------------------------------  ASSESSMENT AND PLAN: Fatigue Slightly improved with discontinuation of bisoprolol.  Extensive evaluation thus far has not yielded a definitive cause.  Single cortisol level was recently found to be low.  It think it would be reasonable to perform a cortisol stimulation test; we will defer this to Ms. Barnetta Chapel endocrinologist at [facility].  Given negative cardiac workup in the past, including echo in 2017 and LHC in 2018, I do not feel that further cardiac workup is indicated at this time.  Hypertension BP well-controlled today.  No medication changes.  Follow-up: Return to clinic in 6 months.  [name], MD 08/18/2017 9:11 PM

## 2017-08-18 ENCOUNTER — Encounter: Payer: Self-pay | Admitting: Internal Medicine

## 2017-08-18 DIAGNOSIS — R5382 Chronic fatigue, unspecified: Secondary | ICD-10-CM | POA: Insufficient documentation

## 2017-09-21 ENCOUNTER — Ambulatory Visit: Payer: Medicare Other | Admitting: Internal Medicine

## 2017-11-21 DIAGNOSIS — F0390 Unspecified dementia without behavioral disturbance: Secondary | ICD-10-CM | POA: Diagnosis present

## 2018-02-12 ENCOUNTER — Ambulatory Visit (INDEPENDENT_AMBULATORY_CARE_PROVIDER_SITE_OTHER): Payer: Medicare Other | Admitting: Pulmonary Disease

## 2018-02-12 ENCOUNTER — Encounter: Payer: Self-pay | Admitting: Pulmonary Disease

## 2018-02-12 VITALS — BP 122/76 | HR 77 | Ht 60.0 in | Wt 137.6 lb

## 2018-02-12 DIAGNOSIS — R29898 Other symptoms and signs involving the musculoskeletal system: Secondary | ICD-10-CM

## 2018-02-12 DIAGNOSIS — R911 Solitary pulmonary nodule: Secondary | ICD-10-CM | POA: Diagnosis not present

## 2018-02-12 DIAGNOSIS — R0609 Other forms of dyspnea: Secondary | ICD-10-CM

## 2018-02-12 DIAGNOSIS — R918 Other nonspecific abnormal finding of lung field: Secondary | ICD-10-CM | POA: Diagnosis not present

## 2018-02-12 NOTE — Patient Instructions (Signed)
Will schedule CT chest for February 2020 and follow up after that

## 2018-02-12 NOTE — Progress Notes (Signed)
[facility], Critical Care, and Sleep Medicine  Chief Complaint  Patient presents with  . Follow-up    pt reports of occ sob with exertion.     Constitutional:  BP 122/76 (BP Location: Left Arm, Cuff Size: Normal)   Pulse 77   Ht 5' (1.524 m)   Wt 137 lb 9.6 oz (62.4 kg)   SpO2 93%   BMI 26.87 kg/m   Past Medical History:  Colitis, OA, Colon polyp, Depression, HLD, HTN, MVP, Osteoporosis  Brief Summary:  Rebecca Stanley is a 84 y.o. female with dyspnea.  She has been going to [facility], and this has helped.  She still feels like she has no energy.  She was seen by geriatric psychiatry at [facility].  She has a regimen to gradually wean off perphenazine-amitriptyline.  Lowering dose of this has helped her energy level.  She is not having cough, wheeze, sputum, or chest pain.  Physical Exam:   Appearance - well kempt   ENMT - clear nasal mucosa, midline nasal  septum, no oral exudates, no LAN, trachea midline  Respiratory - normal chest wall, normal respiratory effort, no accessory muscle use, no wheeze/rales  CV - s1s2 regular rate and rhythm, no murmurs, no peripheral edema, radial pulses symmetric  GI - soft, non tender, no masses  Lymph - no adenopathy noted in neck and axillary areas  MSK - normal gait  Ext - no cyanosis, clubbing, or joint inflammation noted  Skin - no rashes, lesions, or ulcers  Neuro - normal strength, oriented x 3  Psych - normal mood and affect   Assessment/Plan:   Dyspnea on exertion. - likely from deconditioning and medication side effects - symptoms stable to slightly improved from last visit - don't think she needs additional pulmonary testing at this time  Lung nodule. - will arrange for non contrast CT chest for February 2020 - if no change, then she wouldn't need additional radiographic follow up   Patient Instructions  Will schedule CT chest for February 2020 and follow up after  that    Chesley Mires, MD [place] Pager: [phone] 02/12/2018, 4:59 PM  Flow Sheet    Pulmonary tests:  CT chest 07/12/16 >> atherosclerosis, 5 mm nodule RML, RUL tree in bud cluster, scoliosis PFT 07/12/16 >> FEV1 1.75 (118%), FEV1% 90, TLC 5.03 (111%), +BD, unable to do DLCO CT angio chest 04/18/17 >> 6 mm nodule RML, 4 mm nodule RLL  Cardiac tests:  [facility] 06/06/16 >> normal coronaries, EF 65%   Medications:   Allergies as of 02/12/2018      Reactions   Losartan Swelling   Facial swelling   Lisinopril Cough      Medication List        Accurate as of 02/12/18  4:59 PM. Always use your most recent med list.          amLODipine 2.5 MG tablet Commonly known as:  NORVASC Take 2.5 mg by mouth daily.   CALCIUM PO Take 1 tablet by mouth daily.   citalopram 20 MG tablet Commonly known as:  CELEXA Take 20 mg by mouth daily.   fish oil-omega-3 fatty acids 1000 MG capsule Take 2 g by mouth daily.   levothyroxine 25 MCG tablet Commonly known as:  SYNTHROID, LEVOTHROID Take 62.5 mcg by mouth daily.   multivitamin with minerals tablet Take 1 tablet by mouth daily.   Perphenazine-Amitriptyline 4-50 MG Tabs Take 1 tablet by mouth  at bedtime.   VITAMIN D PO Take 1 tablet by mouth daily.       Past Surgical History:  She  has a past surgical history that includes heel tendon repair; Colonoscopy; Hip arthroscopy w/ labral repair; bladder tack; Partial hysterectomy; Dilation and curettage, diagnostic / therapeutic; Cataract extraction; Blepharoptosis repair; kyploplasty; and gluteous medius tendon rupture.  Family History:  Her family history includes Breast cancer in her unknown relative; Diabetes in her unknown relative; Heart disease in her mother and unknown relative; Ovarian cancer in her unknown relative; Rheum arthritis in her father.  Social History:  She  reports that she has never smoked. She has never used smokeless tobacco. She  reports that she does not drink alcohol or use drugs.

## 2018-02-15 ENCOUNTER — Ambulatory Visit (INDEPENDENT_AMBULATORY_CARE_PROVIDER_SITE_OTHER): Payer: Medicare Other | Admitting: Internal Medicine

## 2018-02-15 ENCOUNTER — Encounter: Payer: Self-pay | Admitting: Internal Medicine

## 2018-02-15 VITALS — BP 136/72 | HR 71 | Ht 61.0 in | Wt 134.6 lb

## 2018-02-15 DIAGNOSIS — I10 Essential (primary) hypertension: Secondary | ICD-10-CM | POA: Diagnosis not present

## 2018-02-15 DIAGNOSIS — R5382 Chronic fatigue, unspecified: Secondary | ICD-10-CM | POA: Diagnosis not present

## 2018-02-15 NOTE — Progress Notes (Signed)
Follow-up Outpatient Visit Date: 02/15/2018  Primary Care Provider: Raina Mina., MD [address] [place] [address]  Chief Complaint: Fatigue  HPI:  Rebecca Stanley is a 84 y.o. year-old female with history of mitral valve prolapse, HTN, and HLD, who presents for follow-up of shortness of breath and fatigue.  I last saw her in June, at which time she reported less fatigue after discontinuation of bisoprolol.  She was planning to undergo Cosyntropin stimulation test at [facility].  Since then, she has also seen a geriatric psychiatrist at [facility] and is in the process of slowly weaning perphenazine-amitriptyline.  Today, Rebecca Stanley reports that her fatigue is about the same.  She denies chest pain, shortness of breath, palpitations, lightheadedness, orthopnea, PND, and edema.  --------------------------------------------------------------------------------------------------  Cardiovascular History & Procedures: Cardiovascular Problems:  Mitral valve prolapse  Chronic fatigue  Risk Factors:  Hypertension, hyperlipidemia, age > 85  Cath/PCI:  LHC (06/06/16, [facility]): Normal coronary arteries. LVEF 65%. LVEDP 9 mmHg.  CV Surgery:  None  EP Procedures and Devices:  None  Non-Invasive Evaluation(s):  TTE (08/19/10): Normal LV size and systolic function. Impaired relaxation noted. Normal RV size and function. Mild TR.  Recent CV Pertinent Labs: Lab Results  Component Value Date   K 4.0 07/07/2016   BUN 15 07/07/2016   CREATININE 0.79 07/07/2016    Past medical and surgical history were reviewed and updated in EPIC.  Current Meds  Medication Sig  . amLODipine (NORVASC) 2.5 MG tablet Take 2.5 mg by mouth daily.  Marland Kitchen CALCIUM PO Take 1 tablet by mouth daily.  . Cholecalciferol (VITAMIN D PO) Take 1 tablet by mouth daily.  . citalopram (CELEXA) 20 MG tablet Take 20 mg by mouth daily.  . fish oil-omega-3 fatty acids 1000 MG capsule Take 2 g by mouth  daily.    Marland Kitchen levothyroxine (SYNTHROID, LEVOTHROID) 25 MCG tablet Take 62.5 mcg by mouth daily.   . Multiple Vitamins-Minerals (MULTIVITAMIN WITH MINERALS) tablet Take 1 tablet by mouth daily.    Marland Kitchen Perphenazine-Amitriptyline 4-50 MG TABS Take 1 tablet by mouth at bedtime.     Allergies: Losartan and Lisinopril  Social History   Tobacco Use  . Smoking status: Never Smoker  . Smokeless tobacco: Never Used  Substance Use Topics  . Alcohol use: No  . Drug use: No    Family History  Problem Relation Age of Onset  . Heart disease Mother   . Heart disease Unknown        aunt  . Diabetes Unknown        grandmother  . Breast cancer Unknown        aunt  . Ovarian cancer Unknown        cousin  . Rheum arthritis Father   . Colon cancer Neg Hx     Review of Systems: A 12-system review of systems was performed and was negative except as noted in the HPI.  --------------------------------------------------------------------------------------------------  Physical Exam: BP 136/72   Pulse 71   Ht 5\' 1"  (1.549 m)   Wt 134 lb 9.6 oz (61.1 kg)   SpO2 98%   BMI 25.43 kg/m   General: NAD.  Accompanied by her son. HEENT: No conjunctival pallor or scleral icterus. Moist mucous membranes.  OP clear. Neck: Supple without lymphadenopathy, thyromegaly, JVD, or HJR. Lungs: Normal work of breathing. Clear to auscultation bilaterally without wheezes or crackles. Heart: Regular rate and rhythm without murmurs, rubs, or gallops. Non-displaced PMI. Abd: Bowel sounds  present. Soft, NT/ND without hepatosplenomegaly Ext: No lower extremity edema. Radial, PT, and DP pulses are 2+ bilaterally. Skin: Warm and dry without rash.  Lab Results  Component Value Date   WBC 5.3 07/07/2016   HGB 12.4 07/07/2016   HCT 36.6 07/07/2016   MCV 88.0 07/07/2016   PLT 303.0 07/07/2016    Lab Results  Component Value Date   NA 132 (L) 07/07/2016   K 4.0 07/07/2016   CL 96 07/07/2016   CO2 31 07/07/2016    BUN 15 07/07/2016   CREATININE 0.79 07/07/2016   GLUCOSE 81 07/07/2016   ALT 18 07/07/2016    No results found for: CHOL, HDL, LDLCALC, LDLDIRECT, TRIG, CHOLHDL  --------------------------------------------------------------------------------------------------  ASSESSMENT AND PLAN: Chronic fatigue Unchanged since our last visit.  I do not think that her symptoms are primarily cardiac in nature, given extensive work-up in the past.  I agree with weaning of perphenazine-amitriptyline, as tolerated.  I will defer further work-up to her PCP and multiple other subspecialists.  Hypertension Blood pressure is upper normal today.  However, I think it is reasonable to defer escalation of antihypertensive therapy out of concern for worsening fatigue or precipitating orthostatic lightheadedness.  Follow-up: Return to clinic in 1 year in [place].  [name], MD 02/15/2018 3:53 PM

## 2018-02-15 NOTE — Patient Instructions (Signed)
Medication Instructions:  NONE If you need a refill on your cardiac medications before your next appointment, please call your pharmacy.   Lab work: NONE If you have labs (blood work) drawn today and your tests are completely normal, you will receive your results only by: Marland Kitchen MyChart Message (if you have MyChart) OR . A paper copy in the mail If you have any lab test that is abnormal or we need to change your treatment, we will call you to review the results.  Testing/Procedures: NONE  Follow-Up: At [facility], you and your health needs are our priority.  As part of our continuing mission to provide you with exceptional heart care, we have created designated Provider Care Teams.  These Care Teams include your primary Cardiologist (physician) and Advanced Practice Providers (APPs -  Physician Assistants and Nurse Practitioners) who all work together to provide you with the care you need, when you need it. You will need a follow up appointment in 1 years.  Please call our office 2 months in advance to schedule this appointment.  You may see [name], MD  In [place]  or one of the following Advanced Practice Providers on your designated Care Team:   Murray Hodgkins, NP Christell Faith, PA-C . Marrianne Mood, PA-C  Any Other Special Instructions Will Be Listed Below (If Applicable).

## 2018-02-17 ENCOUNTER — Encounter: Payer: Self-pay | Admitting: Internal Medicine

## 2018-04-02 ENCOUNTER — Telehealth: Payer: Self-pay | Admitting: Pulmonary Disease

## 2018-04-02 NOTE — Telephone Encounter (Signed)
I had called pt to give her CT appt info & she asked me to call her back in 15 minutes.  When I called back she didn't answered.  I have called pt back now & gave her appt info.  Nothing further needed.

## 2018-04-02 NOTE — Telephone Encounter (Signed)
Spoke with the pt  She wanted to confirm her appts  I gave her appt information for the ct and the appt with VS  She states wanted earlier ct appt  I gave her the number to call so that she can change this  Nothing further needed

## 2018-04-20 ENCOUNTER — Ambulatory Visit (INDEPENDENT_AMBULATORY_CARE_PROVIDER_SITE_OTHER)
Admission: RE | Admit: 2018-04-20 | Discharge: 2018-04-20 | Disposition: A | Discharge status: Home / Self Care | Payer: Medicare Other | Source: Ambulatory Visit | Attending: Pulmonary Disease | Admitting: Pulmonary Disease

## 2018-04-20 ENCOUNTER — Other Ambulatory Visit: Payer: Medicare Other

## 2018-04-20 DIAGNOSIS — R918 Other nonspecific abnormal finding of lung field: Secondary | ICD-10-CM | POA: Diagnosis not present

## 2018-04-24 ENCOUNTER — Telehealth: Payer: Self-pay | Admitting: Pulmonary Disease

## 2018-04-24 NOTE — Telephone Encounter (Signed)
Attempted to call patient today regarding results. I did not receive an answer at time of call. I have left a voicemail message for pt to return call. X1  

## 2018-04-24 NOTE — Telephone Encounter (Signed)
CT chest 04/20/18 >> stable 5 mm RML nodule   Please let her know CT chest findings are stable.  Will discuss in more detail at [facility] in March.

## 2018-04-25 ENCOUNTER — Ambulatory Visit: Payer: Medicare Other | Admitting: Pulmonary Disease

## 2018-04-25 NOTE — Telephone Encounter (Signed)
ATC, NA and no option to leave msg 

## 2018-04-26 NOTE — Telephone Encounter (Signed)
Attempted to call patient today regarding results. I have attempted to call three times, no VM set up nor option. X3 No response at this time, a letter has been placed in mail today for pt to call our office. Mailed letter of communication today. Nothing further needed at this time.

## 2018-04-30 ENCOUNTER — Telehealth: Payer: Self-pay | Admitting: Pulmonary Disease

## 2018-04-30 NOTE — Telephone Encounter (Signed)
CT chest 04/20/18 >> stable 5 mm RML nodule   Please let her know CT chest findings are stable.  Will discuss in more detail at [facility] in March.  Spoke with pt and notified of results per Dr. Halford Chessman. Pt verbalized understanding and denied any questions.

## 2018-05-23 ENCOUNTER — Ambulatory Visit: Payer: Medicare Other | Admitting: Pulmonary Disease

## 2018-06-06 ENCOUNTER — Ambulatory Visit: Payer: Medicare Other | Admitting: Pulmonary Disease

## 2018-11-26 ENCOUNTER — Encounter: Payer: Self-pay | Admitting: Internal Medicine

## 2018-11-26 ENCOUNTER — Ambulatory Visit (INDEPENDENT_AMBULATORY_CARE_PROVIDER_SITE_OTHER): Payer: Medicare Other | Admitting: Internal Medicine

## 2018-11-26 ENCOUNTER — Other Ambulatory Visit: Payer: Self-pay

## 2018-11-26 ENCOUNTER — Emergency Department
Admission: EM | Admit: 2018-11-26 | Discharge: 2018-11-26 | Disposition: A | Discharge status: Home / Self Care | Payer: Medicare Other | Attending: Emergency Medicine | Admitting: Emergency Medicine

## 2018-11-26 ENCOUNTER — Emergency Department: Payer: Medicare Other

## 2018-11-26 VITALS — BP 140/62 | HR 75 | Ht 64.0 in | Wt 138.0 lb

## 2018-11-26 DIAGNOSIS — R079 Chest pain, unspecified: Secondary | ICD-10-CM

## 2018-11-26 DIAGNOSIS — I10 Essential (primary) hypertension: Secondary | ICD-10-CM | POA: Diagnosis not present

## 2018-11-26 DIAGNOSIS — Z79899 Other long term (current) drug therapy: Secondary | ICD-10-CM | POA: Diagnosis not present

## 2018-11-26 DIAGNOSIS — R5382 Chronic fatigue, unspecified: Secondary | ICD-10-CM | POA: Diagnosis not present

## 2018-11-26 LAB — COMPREHENSIVE METABOLIC PANEL
ALT: 23 U/L (ref 0–44)
AST: 25 U/L (ref 15–41)
Albumin: 4 g/dL (ref 3.5–5.0)
Alkaline Phosphatase: 50 U/L (ref 38–126)
Anion gap: 8 (ref 5–15)
BUN: 16 mg/dL (ref 8–23)
CO2: 29 mmol/L (ref 22–32)
Calcium: 9.6 mg/dL (ref 8.9–10.3)
Chloride: 97 mmol/L — ABNORMAL LOW (ref 98–111)
Creatinine, Ser: 0.86 mg/dL (ref 0.44–1.00)
GFR calc Af Amer: >60 mL/min (ref >60)
GFR calc non Af Amer: >60 mL/min (ref >60)
Glucose, Bld: 97 mg/dL (ref 70–99)
Potassium: 4.3 mmol/L (ref 3.5–5.1)
Sodium: 134 mmol/L — ABNORMAL LOW (ref 135–145)
Total Bilirubin: 0.9 mg/dL (ref 0.3–1.2)
Total Protein: 6.8 g/dL (ref 6.5–8.1)

## 2018-11-26 LAB — CBC WITH DIFFERENTIAL/PLATELET
Abs Immature Granulocytes: 0.06 10*3/uL (ref 0.00–0.07)
Basophils Absolute: 0.1 10*3/uL (ref 0.0–0.1)
Basophils Relative: 1 %
Eosinophils Absolute: 0.1 10*3/uL (ref 0.0–0.5)
Eosinophils Relative: 1 %
HCT: 39.5 % (ref 36.0–46.0)
Hemoglobin: 13.2 g/dL (ref 12.0–15.0)
Immature Granulocytes: 1 %
Lymphocytes Relative: 16 %
Lymphs Abs: 1.6 10*3/uL (ref 0.7–4.0)
MCH: 29.4 pg (ref 26.0–34.0)
MCHC: 33.4 g/dL (ref 30.0–36.0)
MCV: 88 fL (ref 80.0–100.0)
Monocytes Absolute: 0.8 10*3/uL (ref 0.1–1.0)
Monocytes Relative: 8 %
Neutro Abs: 7.3 10*3/uL (ref 1.7–7.7)
Neutrophils Relative %: 73 %
Platelets: 260 10*3/uL (ref 150–400)
RBC: 4.49 MIL/uL (ref 3.87–5.11)
RDW: 13.7 % (ref 11.5–15.5)
WBC: 10 10*3/uL (ref 4.0–10.5)
nRBC: 0 % (ref 0.0–0.2)

## 2018-11-26 LAB — TROPONIN I (HIGH SENSITIVITY)
Troponin I (High Sensitivity): 7 ng/L (ref <18)
Troponin I (High Sensitivity): 8 ng/L (ref <18)

## 2018-11-26 MED ORDER — ASPIRIN 325 MG PO TABS
325.0000 mg | ORAL_TABLET | Freq: Every day | ORAL | Status: DC
  Administered 2018-11-26: 325 mg via ORAL

## 2018-11-26 MED ORDER — NITROGLYCERIN 0.4 MG SL SUBL: 0.4000 mg | SUBLINGUAL_TABLET | SUBLINGUAL | 2 refills | Status: DC | PRN

## 2018-11-26 MED ORDER — ASPIRIN EC 81 MG PO TBEC: 81.0000 mg | DELAYED_RELEASE_TABLET | Freq: Every day | ORAL | 3 refills | Status: DC

## 2018-11-26 NOTE — ED Triage Notes (Signed)
Pt c/o chest pain that has been off and on since last Thursday - denies chest pain at this time - denies SHOB, dizziness, N/V

## 2018-11-26 NOTE — Patient Instructions (Signed)
Medication Instructions:  Your physician has recommended you make the following change in your medication:  1- START Aspirin 81 mg by  Mouth once a day. 2- Nitroglycerin as needed for chest pain - Dissolve 1 tablet under your tongue every 5 minutes as needed for chest pain. Do not take more than 3 doses. If chest pain, call 911 or go to the Emergency Department.   If you need a refill on your cardiac medications before your next appointment, please call your pharmacy.   Lab work: - None ordered.  If you have labs (blood work) drawn today and your tests are completely normal, you will receive your results only by: Marland Kitchen MyChart Message (if you have MyChart) OR . A paper copy in the mail If you have any lab test that is abnormal or we need to change your treatment, we will call you to review the results.  Testing/Procedures: Your physician has requested that you have a lexiscan myoview. For further information please visit [email or web address]. Please follow instruction sheet, as given.  [place]  Your caregiver has ordered a Stress Test with nuclear imaging. The purpose of this test is to evaluate the blood supply to your heart muscle. This procedure is referred to as a "Non-Invasive Stress Test." This is because other than having an IV started in your vein, nothing is inserted or "invades" your body. Cardiac stress tests are done to find areas of poor blood flow to the heart by determining the extent of coronary artery disease (CAD). Some patients exercise on a treadmill, which naturally increases the blood flow to your heart, while others who are  unable to walk on a treadmill due to physical limitations have a pharmacologic/chemical stress agent called Lexiscan . This medicine will mimic walking on a treadmill by temporarily increasing your coronary blood flow.   Please note: these test may take anywhere between 2-4 hours to complete  PLEASE REPORT TO [place] AT THE FIRST DESK WILL DIRECT YOU WHERE TO GO  Date of Procedure:_____________________________________  Arrival Time for Procedure:______________________________    PLEASE NOTIFY THE OFFICE AT LEAST 24 HOURS IN ADVANCE IF YOU ARE UNABLE TO KEEP YOUR APPOINTMENT.  [phone] AND  PLEASE NOTIFY NUCLEAR MEDICINE AT [facility] AT LEAST 24 HOURS IN ADVANCE IF YOU ARE UNABLE TO KEEP YOUR APPOINTMENT. [phone]  How to prepare for your Myoview test:  1. Do not eat or drink after midnight 2. No caffeine for 24 hours prior to test 3. No smoking 24 hours prior to test. 4. Your medication may be taken with water.  If your doctor stopped a medication because of this test, do not take that medication. 5. Ladies, please do not wear dresses.  Skirts or pants are appropriate. Please wear a short sleeve shirt. 6. No perfume, cologne or lotion. 7. Wear comfortable walking shoes.        Follow-Up: At [facility], you and your health needs are our priority.  As part of our continuing mission to provide you with exceptional heart care, we have created designated Provider Care Teams.  These Care Teams include your primary Cardiologist (physician) and Advanced Practice Providers (APPs -  Physician Assistants and Nurse Practitioners) who all work together to provide you with the care you need, when you need it. You will need a follow up appointment in 3-4 weeks.  Please call our office 2 months in advance to schedule this appointment.  You may see [name], MD or  one of the following Advanced Practice Providers on your designated Care Team:   Murray Hodgkins, NP Christell Faith, PA-C . Marrianne Mood, PA-C    Cardiac Nuclear Scan A cardiac nuclear scan is a test that measures blood flow to the heart when a person is resting and when he or she is exercising. The test looks for problems such as:  Not enough blood reaching a portion of the heart.  The heart muscle not working normally.  You may need this test if:  You have heart disease.  You have had abnormal lab results.  You have had heart surgery or a balloon procedure to open up blocked arteries (angioplasty).  You have chest pain.  You have shortness of breath. In this test, a radioactive dye (tracer) is injected into your bloodstream. After the tracer has traveled to your heart, an imaging device is used to measure how much of the tracer is absorbed by or distributed to various areas of your heart. This procedure is usually done at a hospital and takes 2-4 hours. Tell a health care provider about:  Any allergies you have.  All medicines you are taking, including vitamins, herbs, eye drops, creams, and over-the-counter medicines.  Any problems you or family members have had with anesthetic medicines.  Any blood disorders you have.  Any surgeries you have had.  Any medical conditions you have.  Whether you are pregnant or may be pregnant. What are the risks? Generally, this is a safe procedure. However, problems may occur, including:  Serious chest pain and heart attack. This is only a risk if the stress portion of the test is done.  Rapid heartbeat.  Sensation of warmth in your chest. This usually passes quickly.  Allergic reaction to the tracer. What happens before the procedure?  Ask your health care provider about changing or stopping your regular medicines. This is especially important if you are taking diabetes medicines or blood thinners.  Follow instructions from your health care provider about eating or drinking restrictions.  Remove your jewelry on the day of the procedure. What happens during the procedure?  An IV will be inserted into one of your veins.  Your health care provider will inject a small amount of radioactive tracer through the IV.  You will wait for 20-40 minutes while the tracer travels through your bloodstream.  Your heart activity will be monitored with an  electrocardiogram (ECG).  You will lie down on an exam table.  Images of your heart will be taken for about 15-20 minutes.  You may also have a stress test. For this test, one of the following may be done: ? You will exercise on a treadmill or stationary bike. While you exercise, your heart's activity will be monitored with an ECG, and your blood pressure will be checked. ? You will be given medicines that will increase blood flow to parts of your heart. This is done if you are unable to exercise.  When blood flow to your heart has peaked, a tracer will again be injected through the IV.  After 20-40 minutes, you will get back on the exam table and have more images taken of your heart.  Depending on the type of tracer used, scans may need to be repeated 3-4 hours later.  Your IV line will be removed when the procedure is over. The procedure may vary among health care providers and hospitals. What happens after the procedure?  Unless your health care provider tells you otherwise, you may  return to your normal schedule, including diet, activities, and medicines.  Unless your health care provider tells you otherwise, you may increase your fluid intake. This will help to flush the contrast dye from your body. Drink enough fluid to keep your urine pale yellow.  Ask your health care provider, or the department that is doing the test: ? When will my results be ready? ? How will I get my results? Summary  A cardiac nuclear scan measures the blood flow to the heart when a person is resting and when he or she is exercising.  Tell your health care provider if you are pregnant.  Before the procedure, ask your health care provider about changing or stopping your regular medicines. This is especially important if you are taking diabetes medicines or blood thinners.  After the procedure, unless your health care provider tells you otherwise, increase your fluid intake. This will help flush the  contrast dye from your body.  After the procedure, unless your health care provider tells you otherwise, you may return to your normal schedule, including diet, activities, and medicines. This information is not intended to replace advice given to you by your health care provider. Make sure you discuss any questions you have with your health care provider. Document Released: 03/25/2004 Document Revised: 08/14/2017 Document Reviewed: 08/14/2017 Elsevier Patient Education  2020 [employer].

## 2018-11-26 NOTE — ED Notes (Signed)
325mg  ASA given by heart care nurse at 1415.

## 2018-11-26 NOTE — ED Provider Notes (Signed)
[facility] Emergency Department Provider Note  ____________________________________________  Time seen: Approximately 6:13 PM  I have reviewed the triage vital signs and the nursing notes.   HISTORY  Chief Complaint Chest Pain    HPI Rebecca Stanley is a 85 y.o. female with a history of diverticulosis, hypertension, hyperlipidemia with a normal coronary artery catheterization in 2018 who comes to the ED due to chest pain.  She has had 3 intermittent episodes over the past week, lasting up to 45 minutes each time.  They occur at rest.  No aggravating or alleviating factors.  Described as tightness.  No associated shortness of breath diaphoresis or vomiting.  No dizziness palpitations or syncope.  It is in the center of the chest radiating to the left side of the chest.  She saw cardiology this afternoon who is planning to arrange outpatient  testing.  However, in the clinic today at around 2 PM she had the third episode of chest pain so they sent her to the ED for serial troponins and evaluation of her clinical progress.  Of note patient has been doing a exercise regimen lately, and gets no chest pain during those moments of exertion.     Past Medical History:  Diagnosis Date  . Allergy   . Arthritis   . Blood transfusion   . Colitis   . Colon polyp   . Depression   . Diverticulosis   . Heart murmur   . Hyperlipidemia   . Hypertension   . Mitral valve prolapse   . Osteoporosis   . Thyroid disease      Patient Active Problem List   Diagnosis Date Noted  . Chronic fatigue 08/18/2017  . Essential hypertension 07/11/2017  . Pulmonary nodule 08/04/2016  . Dyspnea on exertion 08/16/2010  . Mitral valve prolapse   . Hyperlipidemia   . DIARRHEA 04/05/2010  . DIVERTICULOSIS, COLON 04/02/2010  . CONSTIPATION, CHRONIC 04/02/2010  . COLONIC POLYPS, BENIGN, HX OF 04/02/2010     Past Surgical History:  Procedure Laterality Date  . BELPHAROPTOSIS  REPAIR    . bladder tack    . CATARACT EXTRACTION    . COLONOSCOPY    . DILATION AND CURETTAGE, DIAGNOSTIC / THERAPEUTIC    . gluteous medius tendon rupture     done at [facility]  . heel tendon repair    . HIP ARTHROSCOPY W/ LABRAL REPAIR    . kyploplasty     T12, L1, L2, L3, done at [facility]  . PARTIAL HYSTERECTOMY       Prior to Admission medications   Medication Sig Start Date End Date Taking? Authorizing Provider  amLODipine (NORVASC) 2.5 MG tablet Take 2.5 mg by mouth daily.    [provider]  aspirin EC 81 MG tablet Take 1 tablet (81 mg total) by mouth daily. 11/26/18   End, Harrell Gave, MD  CALCIUM PO Take 1 tablet by mouth daily.    [provider]  Cholecalciferol (VITAMIN D PO) Take 1 tablet by mouth daily.    [provider]  citalopram (CELEXA) 20 MG tablet Take 20 mg by mouth daily.    [provider]  fish oil-omega-3 fatty acids 1000 MG capsule Take 2 g by mouth daily.      [provider]  levothyroxine (SYNTHROID, LEVOTHROID) 25 MCG tablet Take 62.5 mcg by mouth daily.     [provider]  mirabegron ER (MYRBETRIQ) 50 MG TB24 tablet Take 50 mg by mouth daily.  [provider]  Multiple Vitamins-Minerals (MULTIVITAMIN WITH MINERALS) tablet Take 1 tablet by mouth daily.      [provider]  nitroGLYCERIN (NITROSTAT) 0.4 MG SL tablet Place 1 tablet (0.4 mg total) under the tongue every 5 (five) minutes as needed for chest pain. Maximum of 3 doses. 11/26/18   End, Harrell Gave, MD  Perphenazine-Amitriptyline 4-50 MG TABS Take 1 tablet by mouth at bedtime.     [provider]     Allergies Losartan and Lisinopril   Family History  Problem Relation Age of Onset  . Heart disease Mother   . Heart disease Other        aunt  . Diabetes Other        grandmother  . Breast cancer Other        aunt  . Ovarian cancer Other        cousin  . Rheum arthritis Father   . Colon cancer Neg Hx      Social History Social History   Tobacco Use  . Smoking status: Never Smoker  . Smokeless tobacco: Never Used  Substance Use Topics  . Alcohol use: No  . Drug use: No    Review of Systems  Constitutional:   No fever or chills.  ENT:   No sore throat. No rhinorrhea. Cardiovascular: Positive as above chest pain without  syncope. Respiratory:   No dyspnea or cough. Gastrointestinal:   Negative for abdominal pain, vomiting and diarrhea.  Musculoskeletal:   Negative for focal pain or swelling All other systems reviewed and are negative except as documented above in ROS and HPI.  ____________________________________________   PHYSICAL EXAM:  VITAL SIGNS: ED Triage Vitals  Enc Vitals Group     BP 11/26/18 1441 (!) 160/61     Pulse Rate 11/26/18 1441 66     Resp 11/26/18 1441 20     Temp 11/26/18 1441 98.1 F (36.7 C)     Temp Source 11/26/18 1441 Oral     SpO2 11/26/18 1441 96 %     Weight 11/26/18 1442 138 lb (62.6 kg)     Height 11/26/18 1442 5\' 1"  (1.549 m)     Head Circumference --      Peak Flow --      Pain Score 11/26/18 1442 0     Pain Loc --      Pain Edu? --      Excl. in [place]? --     Vital signs reviewed, nursing assessments reviewed.   Constitutional:   Alert and oriented. Non-toxic appearance. Eyes:   Conjunctivae are normal. EOMI. PERRL. ENT      Head:   Normocephalic and atraumatic.        Neck:   No meningismus. Full ROM. Hematological/Lymphatic/Immunilogical:   No cervical lymphadenopathy. Cardiovascular:   RRR. Symmetric bilateral radial and DP pulses.  No murmurs. Cap refill less than 2 seconds. Respiratory:   Normal respiratory effort without tachypnea/retractions. Breath sounds are clear and equal bilaterally. No wheezes/rales/rhonchi. Gastrointestinal:   Soft and nontender. Non distended. There is no CVA tenderness.  No rebound, rigidity, or guarding. Musculoskeletal:   Normal range of motion in all extremities. No joint effusions.  No  lower extremity tenderness.  No edema. Neurologic:   Normal speech and language.  Motor grossly intact. No acute focal neurologic deficits are appreciated.  Skin:    Skin is warm, dry and intact. No rash noted.  No petechiae, purpura, or bullae.  ____________________________________________  LABS (pertinent positives/negatives) (all labs ordered are listed, but only abnormal results are displayed) Labs Reviewed  COMPREHENSIVE METABOLIC PANEL - Abnormal; Notable for the following components:      Result Value   Sodium 134 (*)    Chloride 97 (*)    All other components within normal limits  CBC WITH DIFFERENTIAL/PLATELET  TROPONIN I (HIGH SENSITIVITY)  TROPONIN I (HIGH SENSITIVITY)   ____________________________________________   EKG  Interpreted by me Normal sinus rhythm rate of 66, left axis, normal intervals.  Normal QRS ST segments and T waves.  No ischemic changes.  ____________________________________________    RADIOLOGY  Dg Chest 2 View  Result Date: 11/26/2018 CLINICAL DATA:  Chest pain EXAM: CHEST - 2 VIEW COMPARISON:  04/19/2007, 04/20/2018 FINDINGS: Mild scarring at the left lung base. No acute airspace disease or pleural effusion. Stable cardiomediastinal silhouette with aortic atherosclerosis. No pneumothorax. Treated compression deformities at the thoracolumbar spine IMPRESSION: No active cardiopulmonary disease. Linear scarring at the left lung base. Electronically Signed   By: Donavan Foil M.D.   On: 11/26/2018 16:20    ____________________________________________   PROCEDURES Procedures  ____________________________________________    CLINICAL IMPRESSION / ASSESSMENT AND PLAN / ED COURSE  Medications ordered in the ED: Medications - No data to display  Pertinent labs & imaging results that were available during my care of the patient were reviewed by me and considered in my medical decision making (see chart for details).  Rebecca Stanley was  evaluated in Emergency Department on 11/26/2018 for the symptoms described in the history of present illness. She was evaluated in the context of the global COVID-19 pandemic, which necessitated consideration that the patient might be at risk for infection with the SARS-CoV-2 virus that causes COVID-19. Institutional protocols and algorithms that pertain to the evaluation of patients at risk for COVID-19 are in a state of rapid change based on information released by regulatory bodies including the CDC and federal and state organizations. These policies and algorithms were followed during the patient's care in the ED.   Patient presents with nonspecific chest pain, currently resolved.  Her 3 discrete episodes started after she was initiated on a course of prednisone for a skin rash, and this may be related to GERD.  EKG, vital signs, serial troponins are all reassuring.  She saw cardiology this morning who can follow-up To continue her outpatient work-up.  Patient is comfortable with this plan.  ____________________________________________   FINAL CLINICAL IMPRESSION(S) / ED DIAGNOSES    Final diagnoses:  Nonspecific chest pain     ED Discharge Orders    None      Portions of this note were generated with dragon dictation software. Dictation errors may occur despite best attempts at proofreading.   Carrie Mew, MD 11/26/18 [phone]

## 2018-11-26 NOTE — Progress Notes (Signed)
Follow-up Outpatient Visit Date: 11/26/2018  Primary Care Provider: Raina Mina., MD [place] [address]  Chief Complaint: Chest pain  HPI:  Ms. Warmuth is a 85 y.o. year-old female with history of mitral valveprolapse, HTN, and HLD, who presents for evaluation of chest pain.  I last saw her in 02/2018, at which time she reported stable chronic fatigue.  She was undergoing workup for adrenal insufficiency at [facility] and was also in the process of being weaned off of perphenazine-amitriptyline.  Today, Ms. Vanderheyden reports having 2 episodes of chest pain, the first of which occurred while she was being driven to [place] 9 days ago.  The pain was a substernal pressure that lasted a few minutes before subsiding on its own.  It did not radiate nor was it associated with other symptoms.  The pain recurred 3 days ago while she was sitting at home and this time lasted about 45 minutes with associated mild diaphoresis and radiation of the pain to the left arm.  She took a couple of baby aspirins, with subsequent resolution of the pain.  She has not noticed any exertional chest pain, having recently started in a rehab program at [facility].  Ms. Chait notes continued severe fatigue, which worsened after a course of steroids and antihistamines within the last 2-3 weeks due to pruritis.  The itching has stopped but she now feels very worn out.  She continues to undergo evaluation through [place] and [facility] regarding her fatigue.  At the Demoni Gergen of our visit, Ms. Diprima reported developing 7/10 tightness across her chest, reminiscent of what she felt over the last 9 days.  There were no associated symptoms.  --------------------------------------------------------------------------------------------------  Cardiovascular History & Procedures: Cardiovascular Problems:  Chest pain  Mitral valve prolapse  Chronic fatigue  Risk Factors:  Hypertension, hyperlipidemia, age >  39  Cath/PCI:  LHC (06/06/16, [facility]): Normal coronary arteries. LVEF 65%. LVEDP 9 mmHg.  CV Surgery:  None  EP Procedures and Devices:  None  Non-Invasive Evaluation(s):  TTE (08/19/10): Normal LV size and systolic function. Impaired relaxation noted. Normal RV size and function. Mild TR.  Recent CV Pertinent Labs: Lab Results  Component Value Date   K 4.0 07/07/2016   BUN 15 07/07/2016   CREATININE 0.79 07/07/2016    Past medical and surgical history were reviewed and updated in EPIC.  No outpatient medications have been marked as taking for the 11/26/18 encounter (Appointment) with Laryah Neuser, Harrell Gave, MD.    Allergies: Losartan and Lisinopril  Social History   Tobacco Use  . Smoking status: Never Smoker  . Smokeless tobacco: Never Used  Substance Use Topics  . Alcohol use: No  . Drug use: No    Family History  Problem Relation Age of Onset  . Heart disease Mother   . Heart disease Unknown        aunt  . Diabetes Unknown        grandmother  . Breast cancer Unknown        aunt  . Ovarian cancer Unknown        cousin  . Rheum arthritis Father   . Colon cancer Neg Hx     Review of Systems: A 12-system review of systems was performed and was negative except as noted in the HPI.  --------------------------------------------------------------------------------------------------  Physical Exam: There were no vitals taken for this visit.  General:  NAD.  Accompanied by her son. HEENT: No conjunctival pallor or  scleral icterus. Facemask in place Neck: Supple without lymphadenopathy, thyromegaly, JVD, or HJR. Lungs: Normal work of breathing. Clear to auscultation bilaterally without wheezes or crackles. Heart: Regular rate and rhythm without murmurs, rubs, or gallops. Non-displaced PMI. Abd: Bowel sounds present. Soft, NT/ND without hepatosplenomegaly Ext: No lower extremity edema. Radial, PT, and DP pulses are 2+ bilaterally. Skin:  Warm and dry without rash.  EKG:  NSR with left axis deviation, low voltage, and poor R-wave progression (new from prior tracing on 07/10/17).  Lab Results  Component Value Date   WBC 5.3 07/07/2016   HGB 12.4 07/07/2016   HCT 36.6 07/07/2016   MCV 88.0 07/07/2016   PLT 303.0 07/07/2016    Lab Results  Component Value Date   NA 132 (L) 07/07/2016   K 4.0 07/07/2016   CL 96 07/07/2016   CO2 31 07/07/2016   BUN 15 07/07/2016   CREATININE 0.79 07/07/2016   GLUCOSE 81 07/07/2016   ALT 18 07/07/2016    No results found for: CHOL, HDL, LDLCALC, LDLDIRECT, TRIG, CHOLHDL  --------------------------------------------------------------------------------------------------  ASSESSMENT AND PLAN: Chest pain: Symptoms typically have been at rest but with pressure-like sensation and intermittent radiation to the left arm.  EKG today shows non-specific findings.  Clean catheterization in 2018 is reassuring, though patient has uncontrolled hyperlipidemia with direct LDL of 190 on most recent check in 07/2018.  Given recurrent chest pain at rest today, I have recommended ED evaluation, including serial troponins.  If troponin remains flat and chest pain resolves, it would be reasonable to proceed with outpatient myocardial perfusion stress test later this week.  If pain continues or troponin rises, admission with inpatient ischemia evaluation will need to be considered.  Of note, the patient was given ASA 324 mg x 1 in the office today.  Fatigue: This has been a long-standing problem and may have been exacerbated by recent course of steroids and antihistamines.  We will defer further workup for now, pending aforementioned assessment of chest pain.  Follow-up: 3-4 weeks.  [name], MD 11/26/2018 9:28 AM

## 2018-11-29 ENCOUNTER — Ambulatory Visit: Payer: Medicare Other | Admitting: Internal Medicine

## 2018-12-10 ENCOUNTER — Encounter
Admission: RE | Admit: 2018-12-10 | Discharge: 2018-12-10 | Disposition: A | Discharge status: Home / Self Care | Payer: Medicare Other | Source: Ambulatory Visit | Attending: Internal Medicine | Admitting: Internal Medicine

## 2018-12-10 ENCOUNTER — Other Ambulatory Visit: Payer: Self-pay

## 2018-12-10 DIAGNOSIS — R079 Chest pain, unspecified: Secondary | ICD-10-CM | POA: Diagnosis present

## 2018-12-10 LAB — NM MYOCAR MULTI W/SPECT W/WALL MOTION / EF
Estimated workload: 1 METS
Exercise duration (min): 0 min
Exercise duration (sec): 0 s
LV dias vol: 43 mL (ref 46–106)
LV sys vol: 17 mL
MPHR: 134 {beats}/min
Peak HR: 90 {beats}/min
Percent HR: 67 %
Rest HR: 62 {beats}/min
SDS: 1
SRS: 0
SSS: 1
TID: 0.79

## 2018-12-10 MED ORDER — REGADENOSON 0.4 MG/5ML IV SOLN
0.4000 mg | Freq: Once | INTRAVENOUS | Status: AC
  Administered 2018-12-10: 0.4 mg via INTRAVENOUS

## 2018-12-10 MED ORDER — TECHNETIUM TC 99M TETROFOSMIN IV KIT
10.3200 | PACK | Freq: Once | INTRAVENOUS | Status: AC | PRN
  Administered 2018-12-10: 09:00:00 10.32 via INTRAVENOUS

## 2018-12-10 MED ORDER — TECHNETIUM TC 99M TETROFOSMIN IV KIT
30.0000 | PACK | Freq: Once | INTRAVENOUS | Status: AC | PRN
  Administered 2018-12-10: 10:00:00 30.08 via INTRAVENOUS

## 2018-12-24 NOTE — Progress Notes (Signed)
Follow-up Outpatient Visit Date: 12/26/2018  Primary Care Provider: Raina Mina., MD [place] [id number]  Chief Complaint: Follow-up chest pain  HPI:  Ms. Nilges is a 85 y.o. year-old female with history of mitral valve prolapse, hypertension, and hyperlipidemia, who presents for follow-up of chest pain.  I last saw Ms. Jurewicz a month ago for evaluation of atypical chest pain.  She endorsed chest pressure at rest during our office visit, which prompted referral to the emergency department.  She ruled out by enzymes and subsequently underwent myocardial perfusion stress testing that was low risk with normal LVEF and no evidence of ischemia/scar.  Today, Ms. Sergent reports that she has not had any further episodes of chest pain.  She continues to have chronic fatigue, which she believes is worse than at prior visits.  She continues to undergo evaluation and management by multiple providers near her home in [place] as well as at [facility].  She and her son are most concerned about continued itching and discoloration of her calves.  She has seen 3 dermatologists and has been on several courses of parenteral steroids.  Bilateral calf swelling seems to be getting worse.  There is no pain in either lower extremity.  Ms. Leete has stable dyspnea on exertion and no palpitations, lightheadedness, or orthopnea.  --------------------------------------------------------------------------------------------------  Cardiovascular History & Procedures: Cardiovascular Problems:  Chest pain  Mitral valve prolapse  Chronic fatigue  Risk Factors:  Hypertension, hyperlipidemia, age > 51  Cath/PCI:  LHC (06/06/16, [facility]): Normal coronary arteries. LVEF 65%. LVEDP 9 mmHg.  CV Surgery:  None  EP Procedures and Devices:  None  Non-Invasive Evaluation(s):  Pharmacologic MPI (12/10/2018): Low risk study without ischemia or scar.  LVEF 55-65%.  TTE (08/19/10):  Normal LV size and systolic function. Impaired relaxation noted. Normal RV size and function. Mild TR.  Recent CV Pertinent Labs: Lab Results  Component Value Date   K 4.3 11/26/2018   BUN 16 11/26/2018   CREATININE 0.86 11/26/2018    Past medical and surgical history were reviewed and updated in EPIC.  Current Meds  Medication Sig  . amitriptyline (ELAVIL) 50 MG tablet Take 50 mg by mouth at bedtime.   Marland Kitchen amLODipine (NORVASC) 2.5 MG tablet Take 2.5 mg by mouth daily.  Marland Kitchen aspirin EC 81 MG tablet Take 1 tablet (81 mg total) by mouth daily.  Marland Kitchen CALCIUM PO Take 1 tablet by mouth daily.  . Cholecalciferol (VITAMIN D PO) Take 1 tablet by mouth daily.  . citalopram (CELEXA) 20 MG tablet Take 20 mg by mouth daily.  . fish oil-omega-3 fatty acids 1000 MG capsule Take 2 g by mouth daily.    Marland Kitchen gabapentin (NEURONTIN) 100 MG capsule Take 100 mg by mouth 3 (three) times daily.   Marland Kitchen levothyroxine (SYNTHROID, LEVOTHROID) 25 MCG tablet Take 62.5 mcg by mouth daily.   . mirabegron ER (MYRBETRIQ) 50 MG TB24 tablet Take 50 mg by mouth daily.  . Multiple Vitamins-Minerals (MULTIVITAMIN WITH MINERALS) tablet Take 1 tablet by mouth daily.    . nitroGLYCERIN (NITROSTAT) 0.4 MG SL tablet Place 1 tablet (0.4 mg total) under the tongue every 5 (five) minutes as needed for chest pain. Maximum of 3 doses.  Marland Kitchen perphenazine (TRILAFON) 4 MG tablet Take 2 mg by mouth at bedtime.    Current Facility-Administered Medications for the 12/26/18 encounter (Office Visit) with Anahli Arvanitis, Harrell Gave, MD  Medication  . aspirin tablet 325 mg    Allergies: Losartan and  Lisinopril  Social History   Tobacco Use  . Smoking status: Never Smoker  . Smokeless tobacco: Never Used  Substance Use Topics  . Alcohol use: No  . Drug use: No    Family History  Problem Relation Age of Onset  . Heart disease Mother   . Heart disease Other        aunt  . Diabetes Other        grandmother  . Breast cancer Other        aunt  .  Ovarian cancer Other        cousin  . Rheum arthritis Father   . Colon cancer Neg Hx     Review of Systems: A 12-system review of systems was performed and was negative except as noted in the HPI.  --------------------------------------------------------------------------------------------------  Physical Exam: BP 130/70 (BP Location: Left Arm, Patient Position: Sitting, Cuff Size: Normal)   Pulse 62   Ht 5\' 1"  (1.549 m)   Wt 139 lb 12 oz (63.4 kg)   BMI 26.41 kg/m   General: NAD.  Accompanied by her son. HEENT: No conjunctival pallor or scleral icterus.  Facemask in place. Lungs: Normal work of breathing. Clear to auscultation bilaterally without wheezes or crackles. Heart: Regular rate and rhythm without murmurs, rubs, or gallops. Abd: Bowel sounds present. Soft, NT/ND without hepatosplenomegaly Ext: 1+ nonpitting edema in both calves.  2+ radial and pedal pulses bilaterally. Skin: Warm and dry.  Areas of erythema and purplish discoloration noted on both calves above the sock line.  EKG: Normal sinus rhythm with possible left atrial enlargement and poor R wave progression.  No significant change from prior tracing on 11/26/2018.  Lab Results  Component Value Date   WBC 10.0 11/26/2018   HGB 13.2 11/26/2018   HCT 39.5 11/26/2018   MCV 88.0 11/26/2018   PLT 260 11/26/2018    Lab Results  Component Value Date   NA 134 (L) 11/26/2018   K 4.3 11/26/2018   CL 97 (L) 11/26/2018   CO2 29 11/26/2018   BUN 16 11/26/2018   CREATININE 0.86 11/26/2018   GLUCOSE 97 11/26/2018   ALT 23 11/26/2018    No results found for: CHOL, HDL, LDLCALC, LDLDIRECT, TRIG, CHOLHDL  --------------------------------------------------------------------------------------------------  ASSESSMENT AND PLAN: Atypical chest pain: No episodes since stress test last month.  Study was low risk without ischemia or scar.  Additionally, catheterization in 2018 at [facility] showed no significant  CAD.  I suspect her pain was noncardiac in origin.  I do not recommend further work-up or intervention at this time.  Lower extremity edema, pruritus, and rash: Concerns have been evaluated by multiple providers, including several dermatologist.  Exam today is notable for some nonpitting edema in both legs.  Pedal pulses are normal.  I wonder if some degree of venous stasis may be contributing to her symptoms.  Given several courses of steroids over the last month or two, it is possible that she may have had worsening fluid retention.  We will prescribe knee-high compression stockings to see if this offers any benefit.  Chronic fatigue: Multifactorial and unlikely due to a primary cardiac cause.  Follow-up: Return to clinic in 3 months.  [name], MD 12/27/2018 8:13 AM

## 2018-12-26 ENCOUNTER — Encounter: Payer: Self-pay | Admitting: Internal Medicine

## 2018-12-26 ENCOUNTER — Other Ambulatory Visit: Payer: Self-pay

## 2018-12-26 ENCOUNTER — Ambulatory Visit (INDEPENDENT_AMBULATORY_CARE_PROVIDER_SITE_OTHER): Payer: Medicare Other | Admitting: Internal Medicine

## 2018-12-26 VITALS — BP 130/70 | HR 62 | Ht 61.0 in | Wt 139.8 lb

## 2018-12-26 DIAGNOSIS — R0789 Other chest pain: Secondary | ICD-10-CM | POA: Diagnosis not present

## 2018-12-26 DIAGNOSIS — R5382 Chronic fatigue, unspecified: Secondary | ICD-10-CM

## 2018-12-26 DIAGNOSIS — R600 Localized edema: Secondary | ICD-10-CM

## 2018-12-26 NOTE — Patient Instructions (Signed)
Medication Instructions:  Your physician recommends that you continue on your current medications as directed. Please refer to the Current Medication list given to you today.  If you need a refill on your cardiac medications before your next appointment, please call your pharmacy.   Lab work: NONE If you have labs (blood work) drawn today and your tests are completely normal, you will receive your results only by: Marland Kitchen MyChart Message (if you have MyChart) OR . A paper copy in the mail If you have any lab test that is abnormal or we need to change your treatment, we will call you to review the results.  Testing/Procedures: NONE  Follow-Up: At [facility], you and your health needs are our priority.  As part of our continuing mission to provide you with exceptional heart care, we have created designated Provider Care Teams.  These Care Teams include your primary Cardiologist (physician) and Advanced Practice Providers (APPs -  Physician Assistants and Nurse Practitioners) who all work together to provide you with the care you need, when you need it. You will need a follow up appointment in 3 months.  You may see [name], MD or one of the following Advanced Practice Providers on your designated Care Team:   Murray Hodgkins, NP Christell Faith, PA-C . Marrianne Mood, PA-C  Any Other Special Instructions Will Be Listed Below (If Applicable).  You may go to any drugstore or medical supply store for knee high compression stockings.

## 2018-12-27 ENCOUNTER — Ambulatory Visit: Payer: Medicare Other | Admitting: Internal Medicine

## 2018-12-27 ENCOUNTER — Encounter: Payer: Self-pay | Admitting: Internal Medicine

## 2018-12-27 DIAGNOSIS — R600 Localized edema: Secondary | ICD-10-CM | POA: Insufficient documentation

## 2019-04-29 DIAGNOSIS — Z85828 Personal history of other malignant neoplasm of skin: Secondary | ICD-10-CM | POA: Insufficient documentation

## 2019-06-12 ENCOUNTER — Other Ambulatory Visit: Payer: Self-pay

## 2019-06-12 ENCOUNTER — Encounter: Payer: Self-pay | Admitting: Internal Medicine

## 2019-06-12 ENCOUNTER — Ambulatory Visit (INDEPENDENT_AMBULATORY_CARE_PROVIDER_SITE_OTHER): Payer: Medicare Other | Admitting: Internal Medicine

## 2019-06-12 ENCOUNTER — Other Ambulatory Visit
Admission: RE | Admit: 2019-06-12 | Discharge: 2019-06-12 | Disposition: A | Discharge status: Home / Self Care | Payer: Medicare Other | Source: Ambulatory Visit | Attending: Internal Medicine | Admitting: Internal Medicine

## 2019-06-12 VITALS — BP 140/70 | HR 70 | Ht 60.0 in | Wt 145.4 lb

## 2019-06-12 DIAGNOSIS — I10 Essential (primary) hypertension: Secondary | ICD-10-CM

## 2019-06-12 DIAGNOSIS — E785 Hyperlipidemia, unspecified: Secondary | ICD-10-CM | POA: Diagnosis not present

## 2019-06-12 DIAGNOSIS — Z13220 Encounter for screening for lipoid disorders: Secondary | ICD-10-CM | POA: Diagnosis present

## 2019-06-12 DIAGNOSIS — R5382 Chronic fatigue, unspecified: Secondary | ICD-10-CM | POA: Diagnosis not present

## 2019-06-12 DIAGNOSIS — R600 Localized edema: Secondary | ICD-10-CM

## 2019-06-12 DIAGNOSIS — R12 Heartburn: Secondary | ICD-10-CM

## 2019-06-12 LAB — LIPID PANEL
Cholesterol: 306 mg/dL — ABNORMAL HIGH (ref 0–200)
HDL: 63 mg/dL (ref >40)
LDL Cholesterol: 202 mg/dL — ABNORMAL HIGH (ref 0–99)
Total CHOL/HDL Ratio: 4.9 RATIO
Triglycerides: 206 mg/dL — ABNORMAL HIGH (ref <150)
VLDL: 41 mg/dL — ABNORMAL HIGH (ref 0–40)

## 2019-06-12 LAB — LDL CHOLESTEROL, DIRECT: Direct LDL: 188.3 mg/dL — ABNORMAL HIGH (ref 0–99)

## 2019-06-12 MED ORDER — FAMOTIDINE 20 MG PO TABS: 20.0000 mg | ORAL_TABLET | Freq: Two times a day (BID) | ORAL | Status: DC | PRN

## 2019-06-12 NOTE — Progress Notes (Signed)
Follow-up Outpatient Visit Date: 06/12/2019  Primary Care Provider: Raina Mina., MD [place] [id number]  Chief Complaint: Leg swelling and fatigue  HPI:  Rebecca Stanley is a 86 y.o. female with history of mitral valve prolapse, hypertension, and hyperlipidemia, who presents for follow-up of chest pain and chronic fatigue.  I last saw her in mid 12/2018, at which time Rebecca Stanley denied further episodes of chest pain following her ED visit in September.  She continued to report worsening of her chronic fatigue, which has been a longstanding problem and has been evaluated by multiple providers.  He also complained of increasing leg edema in the setting of itching and discoloration of her calves.  She had been seen by 3 different dermatologist and prescribed several doses of systemic corticosteroids.  I advised her to wear knee-high compression stockings for management of venous insufficiency and possible venous stasis dermatitis.  Today, Rebecca Stanley reports that she has continued to have leg swelling.  She has used compression stockings on a few occasions but is unsure that they have helped much.  She recently had a skin biopsy through dermatology on her left shin.  She subsequently hit her right shin on a car door and suffered a laceration.  She and her son has been applying antibiotic ointments but notes that it has yet to heal.  She is currently being worked up at [facility] for [place].  Rebecca Stanley notes occasional heartburn that improves with Tums.  She otherwise denies chest pain.  She does not have significant shortness of breath, palpitations, or lightheadedness.  Her son wonders if leg swelling may be medication related.  She also recently experienced some transient left facial swelling.  --------------------------------------------------------------------------------------------------  Cardiovascular History & Procedures: Cardiovascular Problems:  Chest pain  Mitral valve  prolapse  Chronic fatigue  Risk Factors:  Hypertension, hyperlipidemia, age > 44  Cath/PCI:  LHC (06/06/16, [facility]): Normal coronary arteries. LVEF 65%. LVEDP 9 mmHg.  CV Surgery:  None  EP Procedures and Devices:  None  Non-Invasive Evaluation(s):  Pharmacologic MPI (12/10/2018): Low risk study without ischemia or scar.  LVEF 55-65%.  TTE (08/19/10): Normal LV size and systolic function. Impaired relaxation noted. Normal RV size and function. Mild TR.  Current Meds  Medication Sig  . amitriptyline (ELAVIL) 50 MG tablet Take 50 mg by mouth at bedtime.   Marland Kitchen amLODipine (NORVASC) 2.5 MG tablet Take 2.5 mg by mouth daily.  . calcium carbonate (TUMS - DOSED IN MG ELEMENTAL CALCIUM) 500 MG chewable tablet Chew 2 tablets by mouth at bedtime.  Marland Kitchen CALCIUM PO Take 1 tablet by mouth daily.  . Cholecalciferol (VITAMIN D PO) Take 1 tablet by mouth daily.  . citalopram (CELEXA) 20 MG tablet Take 20 mg by mouth daily.  . fish oil-omega-3 fatty acids 1000 MG capsule Take 2 g by mouth daily.    Marland Kitchen levothyroxine (SYNTHROID, LEVOTHROID) 25 MCG tablet Take 62.5 mcg by mouth daily.   . Multiple Vitamins-Minerals (MULTIVITAMIN WITH MINERALS) tablet Take 1 tablet by mouth daily.    . nitroGLYCERIN (NITROSTAT) 0.4 MG SL tablet Place 1 tablet (0.4 mg total) under the tongue every 5 (five) minutes as needed for chest pain. Maximum of 3 doses.  Marland Kitchen perphenazine (TRILAFON) 4 MG tablet Take 2 mg by mouth at bedtime.   . pregabalin (LYRICA) 50 MG capsule Take 100 mg by mouth at bedtime.   Current Facility-Administered Medications for the 06/12/19 encounter (Office Visit) with Ivanka Kirshner, Harrell Gave,  MD  Medication  . aspirin tablet 325 mg    Allergies: Losartan and Lisinopril  Social History   Tobacco Use  . Smoking status: Never Smoker  . Smokeless tobacco: Never Used  Substance Use Topics  . Alcohol use: No  . Drug use: No    Family History  Problem Relation Age of Onset  .  Heart disease Mother   . Heart disease Other        aunt  . Diabetes Other        grandmother  . Breast cancer Other        aunt  . Ovarian cancer Other        cousin  . Rheum arthritis Father   . Colon cancer Neg Hx     Review of Systems: A 12-system review of systems was performed and was negative except as noted in the HPI.  --------------------------------------------------------------------------------------------------  Physical Exam: BP 140/70 (BP Location: Left Arm, Patient Position: Sitting, Cuff Size: Normal)   Pulse 70   Ht 5' (1.524 m)   Wt 145 lb 6 oz (65.9 kg)   SpO2 95%   BMI 28.39 kg/m   General: NAD.  Accompanied by her son. HEENT: No conjunctival pallor or scleral icterus. Facemask in place. Neck: Supple without lymphadenopathy, thyromegaly, JVD, or HJR. Lungs: Normal work of breathing. Clear to auscultation bilaterally without wheezes or crackles. Heart: Regular rate and rhythm without murmurs, rubs, or gallops. Non-displaced PMI. Abd: Bowel sounds present. Soft, NT/ND without hepatosplenomegaly Ext: 1+ pretibial edema bilaterally.  Dressings overlie both shins.  Right shin dressing was removed revealing a superficial laceration without surrounding cellulitis or drainage.  Pedal pulses are 2+ bilaterally.  EKG: Normal sinus rhythm without abnormality.  Lab Results  Component Value Date   WBC 10.0 11/26/2018   HGB 13.2 11/26/2018   HCT 39.5 11/26/2018   MCV 88.0 11/26/2018   PLT 260 11/26/2018    Lab Results  Component Value Date   NA 134 (L) 11/26/2018   K 4.3 11/26/2018   CL 97 (L) 11/26/2018   CO2 29 11/26/2018   BUN 16 11/26/2018   CREATININE 0.86 11/26/2018   GLUCOSE 97 11/26/2018   ALT 23 11/26/2018    Lab Results  Component Value Date   CHOL 306 (H) 06/12/2019   HDL 63 06/12/2019   LDLCALC 202 (H) 06/12/2019   LDLDIRECT 188.3 (H) 06/12/2019   TRIG 206 (H) 06/12/2019   CHOLHDL 4.9 06/12/2019     --------------------------------------------------------------------------------------------------  ASSESSMENT AND PLAN: Lower extremity edema and wounds: This has been longstanding and is likely a combination of factors, including medication side effects and chronic venous insufficiency.  I have encouraged Rebecca Stanley to elevate her legs.  I think it is reasonable to defer wearing compression stockings given her healing wounds.  We discussed stopping amlodipine, which Rebecca Stanley will try if her leg swelling does not improve over the next week or 2 with more diligent elevation of her legs.  I advised her to contact her PCP and/or dermatologist if her shin wounds do not heal over the next week so that she can be referred to wound care.  No I do not think that she has significant arterial insufficiency, if wounds are slow to heal, ABIs will be considered.  Fatigue: Longstanding and stable.  I do not believe this is primarily cardiac in nature given extensive work-up over the last few years.  We have agreed to defer additional testing at this time.  Rebecca Stanley  should continue to follow with her multiple other specialists, primarily at [facility].  Hypertension: Blood pressure borderline today at 140/70.  We will defer medication changes at this time.  Hyperlipidemia: Rebecca Stanley son notes that his mother previously had elevated cholesterol and was on a statin.  He is unsure why it was stopped.  We have agreed to check a lipid panel with direct LDL and consider reinitiation of statin therapy if lipids are significantly abnormal.  Heartburn: I have recommended that Rebecca Stanley try famotidine 20 mg twice daily as needed.  Follow-up: Return to clinic in 4 months.  [name], MD 06/14/2019 7:08 PM

## 2019-06-12 NOTE — Patient Instructions (Addendum)
  Other Instructions Dr End recommends you elevate your legs to see if that helps with the swelling. If it does not, you can stop amlodipine to see if it improves or not. Please keep [place] updated.  If the wounds on your legs to not improve, please contact your Primary Care Provider for evaluation and possible referral to wound clinic.     Medication Instructions:  Your physician recommends that you continue on your current medications as directed. Please refer to the Current Medication list given to you today.  Try Famotidine 20 mg by mouth two times a day as needed for heartburn (over the counter).  *If you need a refill on your cardiac medications before your next appointment, please call your pharmacy*   Lab Work: Your physician recommends that you return for lab work in: today as you leave through the [place]. - LIPID, Direct LDL. - Please go to the [facility]. You will check in at the front desk to the right as you walk into the atrium. Valet Parking is offered if needed. - No appointment needed. You may go any day between 7 am and 6 pm.  If you have labs (blood work) drawn today and your tests are completely normal, you will receive your results only by: Marland Kitchen MyChart Message (if you have MyChart) OR . A paper copy in the mail If you have any lab test that is abnormal or we need to change your treatment, we will call you to review the results.   Testing/Procedures: none   Follow-Up: At [facility], you and your health needs are our priority.  As part of our continuing mission to provide you with exceptional heart care, we have created designated Provider Care Teams.  These Care Teams include your primary Cardiologist (physician) and Advanced Practice Providers (APPs -  Physician Assistants and Nurse Practitioners) who all work together to provide you with the care you need, when you need it.  We recommend signing up for the patient portal called "MyChart".  Sign up  information is provided on this After Visit Summary.  MyChart is used to connect with patients for Virtual Visits (Telemedicine).  Patients are able to view lab/test results, encounter notes, upcoming appointments, etc.  Non-urgent messages can be sent to your provider as well.   To learn more about what you can do with MyChart, go to [email or web address].    Your next appointment:   4 month(s)  The format for your next appointment:   In Person  Provider:    You may see [name], MD or one of the following Advanced Practice Providers on your designated Care Team:    Murray Hodgkins, NP  Christell Faith, PA-C  Marrianne Mood, PA-C

## 2019-06-13 ENCOUNTER — Telehealth: Payer: Self-pay | Admitting: *Deleted

## 2019-06-13 DIAGNOSIS — E785 Hyperlipidemia, unspecified: Secondary | ICD-10-CM

## 2019-06-13 MED ORDER — ROSUVASTATIN CALCIUM 5 MG PO TABS: 5.0000 mg | ORAL_TABLET | Freq: Every day | ORAL | 3 refills | Status: DC

## 2019-06-13 MED ORDER — ROSUVASTATIN CALCIUM 5 MG PO TABS: 5.0000 mg | ORAL_TABLET | Freq: Every day | ORAL | 1 refills | Status: DC

## 2019-06-13 NOTE — Telephone Encounter (Signed)
-----   Message from [name], MD sent at 06/13/2019 11:19 AM EDT ----- Please let [name] and her son know that her cholesterol is quite elevated, with an LDL of 188.  It sounds like she was on a statin in the past.  If she does not recall any adverse effects from the medication in the past, I suggest we start her on rosuvastatin 5 mg daily and recheck a fasting lipid panel and ALT in ~3 months.

## 2019-06-13 NOTE — Telephone Encounter (Signed)
Results called to patient's son, ok per DPR. He verbalized understanding of results and plan of care. Aware to start rosuvastatin 5 mg by mouth daily and repeat fasting labs in about 3 months at the [facility]. Rx sent to pharmacy.

## 2019-06-14 ENCOUNTER — Encounter: Payer: Self-pay | Admitting: Internal Medicine

## 2019-06-27 DIAGNOSIS — D472 Monoclonal gammopathy: Secondary | ICD-10-CM | POA: Insufficient documentation

## 2019-07-23 ENCOUNTER — Other Ambulatory Visit: Payer: Self-pay

## 2019-07-23 MED ORDER — ROSUVASTATIN CALCIUM 5 MG PO TABS: 5.0000 mg | ORAL_TABLET | Freq: Every day | ORAL | 3 refills | Status: DC

## 2019-07-31 ENCOUNTER — Other Ambulatory Visit: Payer: Self-pay

## 2019-07-31 ENCOUNTER — Emergency Department (HOSPITAL_COMMUNITY)
Admission: EM | Admit: 2019-07-31 | Discharge: 2019-07-31 | Disposition: A | Discharge status: Home / Self Care | Payer: Medicare Other | Attending: Emergency Medicine | Admitting: Emergency Medicine

## 2019-07-31 DIAGNOSIS — Z5321 Procedure and treatment not carried out due to patient leaving prior to being seen by health care provider: Secondary | ICD-10-CM | POA: Insufficient documentation

## 2019-07-31 DIAGNOSIS — R251 Tremor, unspecified: Secondary | ICD-10-CM | POA: Diagnosis not present

## 2019-07-31 DIAGNOSIS — R202 Paresthesia of skin: Secondary | ICD-10-CM | POA: Diagnosis present

## 2019-07-31 LAB — BASIC METABOLIC PANEL
Anion gap: 12 (ref 5–15)
BUN: 23 mg/dL (ref 8–23)
CO2: 25 mmol/L (ref 22–32)
Calcium: 9.9 mg/dL (ref 8.9–10.3)
Chloride: 101 mmol/L (ref 98–111)
Creatinine, Ser: 0.88 mg/dL (ref 0.44–1.00)
GFR calc Af Amer: >60 mL/min (ref >60)
GFR calc non Af Amer: 59 mL/min — ABNORMAL LOW (ref >60)
Glucose, Bld: 101 mg/dL — ABNORMAL HIGH (ref 70–99)
Potassium: 3.9 mmol/L (ref 3.5–5.1)
Sodium: 138 mmol/L (ref 135–145)

## 2019-07-31 LAB — CBC
HCT: 41 % (ref 36.0–46.0)
Hemoglobin: 13.3 g/dL (ref 12.0–15.0)
MCH: 28.8 pg (ref 26.0–34.0)
MCHC: 32.4 g/dL (ref 30.0–36.0)
MCV: 88.7 fL (ref 80.0–100.0)
Platelets: 290 10*3/uL (ref 150–400)
RBC: 4.62 MIL/uL (ref 3.87–5.11)
RDW: 14.6 % (ref 11.5–15.5)
WBC: 5.9 10*3/uL (ref 4.0–10.5)
nRBC: 0 % (ref 0.0–0.2)

## 2019-07-31 NOTE — ED Triage Notes (Addendum)
Pt presents to ED from home BIB [facility]. Pt c/o pins and needles all over and tremors. Pt was seen at [facility] today and had injections in spine for chronic pain. Pt appears anxious in triage because she wants her son with her. EMS VSS.

## 2019-07-31 NOTE — ED Notes (Signed)
Pt wants to leave. Tired of waiting and sitting. Encouraged to say. Warned of risks of leaving.Pt refuses. Labels retrieved and seen leaving lobby.

## 2019-08-01 ENCOUNTER — Emergency Department (HOSPITAL_COMMUNITY)
Admission: EM | Admit: 2019-08-01 | Discharge: 2019-08-01 | Disposition: A | Discharge status: Home / Self Care | Payer: Medicare Other | Attending: Emergency Medicine | Admitting: Emergency Medicine

## 2019-08-01 ENCOUNTER — Emergency Department (HOSPITAL_COMMUNITY): Payer: Medicare Other

## 2019-08-01 DIAGNOSIS — Z79899 Other long term (current) drug therapy: Secondary | ICD-10-CM | POA: Insufficient documentation

## 2019-08-01 DIAGNOSIS — I10 Essential (primary) hypertension: Secondary | ICD-10-CM | POA: Insufficient documentation

## 2019-08-01 DIAGNOSIS — Z9889 Other specified postprocedural states: Secondary | ICD-10-CM | POA: Diagnosis not present

## 2019-08-01 DIAGNOSIS — R251 Tremor, unspecified: Secondary | ICD-10-CM | POA: Diagnosis not present

## 2019-08-01 LAB — CBC WITH DIFFERENTIAL/PLATELET
Abs Immature Granulocytes: 0.04 10*3/uL (ref 0.00–0.07)
Basophils Absolute: 0.1 10*3/uL (ref 0.0–0.1)
Basophils Relative: 1 %
Eosinophils Absolute: 0.1 10*3/uL (ref 0.0–0.5)
Eosinophils Relative: 2 %
HCT: 42.4 % (ref 36.0–46.0)
Hemoglobin: 13.7 g/dL (ref 12.0–15.0)
Immature Granulocytes: 1 %
Lymphocytes Relative: 21 %
Lymphs Abs: 1.4 10*3/uL (ref 0.7–4.0)
MCH: 28.5 pg (ref 26.0–34.0)
MCHC: 32.3 g/dL (ref 30.0–36.0)
MCV: 88.1 fL (ref 80.0–100.0)
Monocytes Absolute: 0.8 10*3/uL (ref 0.1–1.0)
Monocytes Relative: 12 %
Neutro Abs: 4.3 10*3/uL (ref 1.7–7.7)
Neutrophils Relative %: 63 %
Platelets: 269 10*3/uL (ref 150–400)
RBC: 4.81 MIL/uL (ref 3.87–5.11)
RDW: 14.4 % (ref 11.5–15.5)
WBC: 6.8 10*3/uL (ref 4.0–10.5)
nRBC: 0 % (ref 0.0–0.2)

## 2019-08-01 LAB — URINALYSIS, ROUTINE W REFLEX MICROSCOPIC
Bilirubin Urine: NEGATIVE
Glucose, UA: NEGATIVE mg/dL
Hgb urine dipstick: NEGATIVE
Ketones, ur: NEGATIVE mg/dL
Leukocytes,Ua: NEGATIVE
Nitrite: NEGATIVE
Protein, ur: NEGATIVE mg/dL
Specific Gravity, Urine: 1.008 (ref 1.005–1.030)
pH: 7 (ref 5.0–8.0)

## 2019-08-01 LAB — BASIC METABOLIC PANEL
Anion gap: 13 (ref 5–15)
BUN: 17 mg/dL (ref 8–23)
CO2: 25 mmol/L (ref 22–32)
Calcium: 9.9 mg/dL (ref 8.9–10.3)
Chloride: 101 mmol/L (ref 98–111)
Creatinine, Ser: 0.74 mg/dL (ref 0.44–1.00)
GFR calc Af Amer: >60 mL/min (ref >60)
GFR calc non Af Amer: >60 mL/min (ref >60)
Glucose, Bld: 100 mg/dL — ABNORMAL HIGH (ref 70–99)
Potassium: 4.1 mmol/L (ref 3.5–5.1)
Sodium: 139 mmol/L (ref 135–145)

## 2019-08-01 NOTE — ED Provider Notes (Signed)
[facility] EMERGENCY DEPARTMENT Provider Note   CSN: [id number] Arrival date & time: 08/01/19  0104   History Chief Complaint  Patient presents with  . Tremors    Rebecca Stanley is a 86 y.o. female.  The history is provided by the patient and a relative.  She has history of hypertension, hyperlipidemia and comes in because of an episode of shaking.  Earlier today, she had been at [facility] for lumbar medial branch block L4, L5.  After getting home, she had an episode where she had uncontrolled shaking for about 20 minutes before it resolved.  It occurred 1 more time.  One prior episode in the past was associated with low sodium.  She denies fever and denies sweats.  There has been no cough and urinary urgency or frequency.  She denies nausea or vomiting.  Past Medical History:  Diagnosis Date  . Allergy   . Arthritis   . Blood transfusion   . Cancer ([place])   . Colitis   . Colon polyp   . Depression   . Diverticulosis   . Heart murmur   . Hyperlipidemia   . Hypertension   . Mitral valve prolapse   . Osteoporosis   . Thyroid disease     Patient Active Problem List   Diagnosis Date Noted  . Lower extremity edema 12/27/2018  . Chronic fatigue 08/18/2017  . Essential hypertension 07/11/2017  . Pulmonary nodule 08/04/2016  . Dyspnea on exertion 08/16/2010  . Mitral valve prolapse   . Hyperlipidemia   . DIARRHEA 04/05/2010  . DIVERTICULOSIS, COLON 04/02/2010  . CONSTIPATION, CHRONIC 04/02/2010  . COLONIC POLYPS, BENIGN, HX OF 04/02/2010    Past Surgical History:  Procedure Laterality Date  . BELPHAROPTOSIS REPAIR    . bladder tack    . CATARACT EXTRACTION    . COLONOSCOPY    . DILATION AND CURETTAGE, DIAGNOSTIC / THERAPEUTIC    . gluteous medius tendon rupture     done at [facility]  . heel tendon repair    . HIP ARTHROSCOPY W/ LABRAL REPAIR    . kyploplasty     T12, L1, L2, L3, done at [facility]  . PARTIAL HYSTERECTOMY       OB History    No obstetric history on file.     Family History  Problem Relation Age of Onset  . Heart disease Mother   . Heart disease Other        aunt  . Diabetes Other        grandmother  . Breast cancer Other        aunt  . Ovarian cancer Other        cousin  . Rheum arthritis Father   . Colon cancer Neg Hx     Social History   Tobacco Use  . Smoking status: Never Smoker  . Smokeless tobacco: Never Used  Substance Use Topics  . Alcohol use: No  . Drug use: No    Home Medications Prior to Admission medications   Medication Sig Start Date End Date Taking? Authorizing Provider  amitriptyline (ELAVIL) 50 MG tablet Take 50 mg by mouth at bedtime.  12/25/18   [provider]  amLODipine (NORVASC) 2.5 MG tablet Take 2.5 mg by mouth daily.    [provider]  calcium carbonate (TUMS - DOSED IN MG ELEMENTAL CALCIUM) 500 MG chewable tablet Chew 2 tablets by mouth at bedtime.    [provider]  CALCIUM PO  Take 1 tablet by mouth daily.    [provider]  Cholecalciferol (VITAMIN D PO) Take 1 tablet by mouth daily.    [provider]  citalopram (CELEXA) 20 MG tablet Take 20 mg by mouth daily.    [provider]  famotidine (PEPCID) 20 MG tablet Take 1 tablet (20 mg total) by mouth 2 (two) times daily as needed for heartburn or indigestion. 06/12/19   End, Harrell Gave, MD  fish oil-omega-3 fatty acids 1000 MG capsule Take 2 g by mouth daily.      [provider]  levothyroxine (SYNTHROID, LEVOTHROID) 25 MCG tablet Take 62.5 mcg by mouth daily.     [provider]  Multiple Vitamins-Minerals (MULTIVITAMIN WITH MINERALS) tablet Take 1 tablet by mouth daily.      [provider]  nitroGLYCERIN (NITROSTAT) 0.4 MG SL tablet Place 1 tablet (0.4 mg total) under the tongue every 5 (five) minutes as needed for chest pain. Maximum of 3 doses. 11/26/18   End, Harrell Gave, MD  perphenazine (TRILAFON) 4 MG tablet Take 2 mg by  mouth at bedtime.  12/24/18   [provider]  pregabalin (LYRICA) 50 MG capsule Take 100 mg by mouth at bedtime.    [provider]  rosuvastatin (CRESTOR) 5 MG tablet Take 1 tablet (5 mg total) by mouth daily. 06/13/19 09/11/19  End, Harrell Gave, MD  rosuvastatin (CRESTOR) 5 MG tablet Take 1 tablet (5 mg total) by mouth daily. 07/23/19 10/21/19  End, Harrell Gave, MD    Allergies    Losartan and Lisinopril  Review of Systems   Review of Systems  All other systems reviewed and are negative.   Physical Exam Updated Vital Signs BP (!) 142/98 (BP Location: Right Arm)   Pulse 89   Temp (!) 97.3 F (36.3 C) (Oral)   Resp 20   SpO2 99%   Physical Exam Vitals and nursing note reviewed.   86 year old female, resting comfortably and in no acute distress. Vital signs are significant for mildly elevated blood pressure. Oxygen saturation is 99%, which is normal. Head is normocephalic and atraumatic. PERRLA, EOMI. Oropharynx is clear. Neck is nontender and supple without adenopathy or JVD. Back is nontender and there is no CVA tenderness. Lungs are clear without rales, wheezes, or rhonchi. Chest is nontender. Heart has regular rate and rhythm without murmur. Abdomen is soft, flat, nontender without masses or hepatosplenomegaly and peristalsis is normoactive. Extremities have 2+ edema, full range of motion is present. Skin is warm and dry without rash. Neurologic: Mental status is normal, cranial nerves are intact, there are no motor or sensory deficits.  ED Results / Procedures / Treatments   Labs (all labs ordered are listed, but only abnormal results are displayed) Labs Reviewed  BASIC METABOLIC PANEL - Abnormal; Notable for the following components:      Result Value   Glucose, Bld 100 (*)    All other components within normal limits  CBC WITH DIFFERENTIAL/PLATELET  URINALYSIS, ROUTINE W REFLEX MICROSCOPIC   Imaging Results DG Chest Port 1 View  Result Date:  08/01/2019 CLINICAL DATA:  Initial evaluation for acute chills. Tremors. EXAM: PORTABLE CHEST 1 VIEW COMPARISON:  Prior radiograph from 11/26/2018. FINDINGS: Transverse heart size within normal limits. Mediastinal silhouette normal. Aortic atherosclerosis. Lungs normally inflated. Linear atelectasis versus scarring present at the peripheral left lung base. No other focal airspace disease. No edema or effusion. No pneumothorax. No acute osseous finding. IMPRESSION: 1. Linear atelectasis versus scarring at the peripheral  left lung base. No other active cardiopulmonary disease. 2.  Aortic Atherosclerosis (ICD10-I70.0). Electronically Signed   By: Jeannine Boga M.D.   On: 08/01/2019 04:55    Procedures Procedures  Medications Ordered in ED Medications - No data to display  ED Course  I have reviewed the triage vital signs and the nursing notes.  Pertinent lab results that were available during my care of the patient were reviewed by me and considered in my medical decision making (see chart for details).  MDM Rules/Calculators/A&P Transient shaking of uncertain cause.  This certainly could have been related to occult infection although she is not running a fever.  Consider electrolyte disturbance.  Old records are reviewed confirming nerve injection at [facility] earlier today.  I cannot find the prior episode when she had hyponatremia.  Will check chest x-ray and urinalysis to look for occult infection, will check electrolytes.  Patient is asymptomatic now.  Chest x-ray shows no evidence of pneumonia.  Electrolytes are normal.  WBC is normal with normal differential.  She has not given a urine sample, but with no fever and normal WBC, I feel that she is safe for discharge.  She is advised to continue to follow-up with her physicians at [facility], return should she develop fever or any new symptoms.  Final Clinical Impression(s) / ED Diagnoses Final diagnoses:  Tremor    Rx / DC  Orders ED Discharge Orders    None       Delora Fuel, MD [id number] [phone]

## 2019-08-01 NOTE — ED Triage Notes (Addendum)
Pt here earlier this evening for same CC. Pt LWBS d/t wait. See previous triage letter.

## 2019-08-01 NOTE — Discharge Instructions (Addendum)
Return if you start running a fever, or if any new or worrisome symptoms arise.

## 2019-08-01 NOTE — ED Notes (Signed)
Patient verbalizes understanding of discharge instructions. Opportunity for questioning and answers were provided. Armband removed by staff, pt discharged from ED stable & ambulatory  

## 2019-08-05 DIAGNOSIS — R531 Weakness: Secondary | ICD-10-CM | POA: Diagnosis not present

## 2019-08-05 DIAGNOSIS — I361 Nonrheumatic tricuspid (valve) insufficiency: Secondary | ICD-10-CM

## 2019-08-05 DIAGNOSIS — I351 Nonrheumatic aortic (valve) insufficiency: Secondary | ICD-10-CM | POA: Diagnosis not present

## 2019-08-05 DIAGNOSIS — R55 Syncope and collapse: Secondary | ICD-10-CM | POA: Diagnosis not present

## 2019-08-06 DIAGNOSIS — R531 Weakness: Secondary | ICD-10-CM | POA: Diagnosis not present

## 2019-08-06 DIAGNOSIS — R55 Syncope and collapse: Secondary | ICD-10-CM | POA: Diagnosis not present

## 2019-10-16 ENCOUNTER — Ambulatory Visit: Payer: Medicare Other | Admitting: Internal Medicine

## 2019-11-20 ENCOUNTER — Ambulatory Visit: Payer: Medicare Other | Admitting: Internal Medicine

## 2019-12-16 DIAGNOSIS — M51369 Other intervertebral disc degeneration, lumbar region without mention of lumbar back pain or lower extremity pain: Secondary | ICD-10-CM | POA: Diagnosis present

## 2020-01-09 ENCOUNTER — Ambulatory Visit: Payer: Medicare Other | Admitting: Internal Medicine

## 2020-02-19 ENCOUNTER — Ambulatory Visit: Payer: Medicare Other | Admitting: Internal Medicine

## 2020-04-17 ENCOUNTER — Encounter: Payer: Self-pay | Admitting: Internal Medicine

## 2020-04-17 ENCOUNTER — Other Ambulatory Visit: Payer: Self-pay

## 2020-04-17 ENCOUNTER — Ambulatory Visit (INDEPENDENT_AMBULATORY_CARE_PROVIDER_SITE_OTHER): Payer: Medicare Other | Admitting: Internal Medicine

## 2020-04-17 VITALS — BP 138/60 | HR 84 | Ht 61.0 in | Wt 150.4 lb

## 2020-04-17 DIAGNOSIS — I10 Essential (primary) hypertension: Secondary | ICD-10-CM

## 2020-04-17 DIAGNOSIS — R5382 Chronic fatigue, unspecified: Secondary | ICD-10-CM | POA: Diagnosis not present

## 2020-04-17 DIAGNOSIS — E782 Mixed hyperlipidemia: Secondary | ICD-10-CM | POA: Diagnosis not present

## 2020-04-17 NOTE — Progress Notes (Signed)
Follow-up Outpatient Visit Date: 04/17/2020  Primary Care Provider: Raina Mina., MD [place] [id number]  Chief Complaint: Follow-up fatigue  HPI:  Ms. Penning is a 87 y.o. female with history of mitral valve prolapse, HTN, and HLD, who presents for follow-up of edema, chest pain, and chronic fatigue.  I last saw Ms. Alcario Drought in 05/2019, at which time she was most concerned about ongoing leg edema and fatigue.  Chest pain/heartburn had improved with intermittent use of Tums.  We added rosuvastatin last year due to significant elevated LDL, though ms. Pralle has since stopped taking the medication as she felt like it worsened her fatigue.  Today, Ms. Herrig feels about the same as at prior visits.  She underwent implantation of a back neurostimulator about a month ago at [facility] and is recovering from the procedure.  Her son notes that it could take 2-9 months to see any significant improvements from the device.  She denies chest pain, shortness of breath, palpitations, lightheadedness, and edema.  --------------------------------------------------------------------------------------------------  Cardiovascular History & Procedures: Cardiovascular Problems:  Chest pain  Mitral valve prolapse  Chronic fatigue  Risk Factors:  Hypertension, hyperlipidemia, age > 7  Cath/PCI:  LHC (06/06/16, [facility]): Normal coronary arteries. LVEF 65%. LVEDP 9 mmHg.  CV Surgery:  None  EP Procedures and Devices:  None  Non-Invasive Evaluation(s):  Pharmacologic MPI (12/10/2018): Low risk study without ischemia or scar. LVEF 55-65%.  TTE (08/19/10): Normal LV size and systolic function. Impaired relaxation noted. Normal RV size and function. Mild TR.  Recent CV Pertinent Labs: Lab Results  Component Value Date   CHOL 306 (H) 06/12/2019   HDL 63 06/12/2019   LDLCALC 202 (H) 06/12/2019   LDLDIRECT 188.3 (H) 06/12/2019   TRIG 206 (H) 06/12/2019    CHOLHDL 4.9 06/12/2019   K 4.1 08/01/2019   BUN 17 08/01/2019   CREATININE 0.74 08/01/2019    Past medical and surgical history were reviewed and updated in EPIC.  Current Meds  Medication Sig  . amitriptyline (ELAVIL) 50 MG tablet Take 50 mg by mouth at bedtime.   Marland Kitchen amLODipine (NORVASC) 2.5 MG tablet Take 2.5 mg by mouth daily.  . calcium carbonate (TUMS - DOSED IN MG ELEMENTAL CALCIUM) 500 MG chewable tablet Chew 2 tablets by mouth at bedtime.  Marland Kitchen CALCIUM PO Take 1 tablet by mouth daily.  . Cholecalciferol (VITAMIN D PO) Take 1 tablet by mouth daily.  . citalopram (CELEXA) 20 MG tablet Take 20 mg by mouth daily.  . famotidine (PEPCID) 20 MG tablet Take 1 tablet (20 mg total) by mouth 2 (two) times daily as needed for heartburn or indigestion.  . fish oil-omega-3 fatty acids 1000 MG capsule Take 2 g by mouth daily.  Marland Kitchen levothyroxine (SYNTHROID, LEVOTHROID) 25 MCG tablet Take 62.5 mcg by mouth daily.  . Multiple Vitamins-Minerals (MULTIVITAMIN WITH MINERALS) tablet Take 1 tablet by mouth daily.  . nitroGLYCERIN (NITROSTAT) 0.4 MG SL tablet Place 1 tablet (0.4 mg total) under the tongue every 5 (five) minutes as needed for chest pain. Maximum of 3 doses.  Marland Kitchen perphenazine (TRILAFON) 4 MG tablet Take 2 mg by mouth at bedtime.   . pregabalin (LYRICA) 50 MG capsule Take 50 mg by mouth at bedtime.   Current Facility-Administered Medications for the 04/17/20 encounter (Office Visit) with Yuridiana Formanek, Harrell Gave, MD  Medication  . aspirin tablet 325 mg    Allergies: Losartan, Tramadol, and Lisinopril  Social History   Tobacco Use  .  Smoking status: Never Smoker  . Smokeless tobacco: Never Used  Vaping Use  . Vaping Use: Never used  Substance Use Topics  . Alcohol use: No  . Drug use: No    Family History  Problem Relation Age of Onset  . Heart disease Mother   . Heart disease Other        aunt  . Diabetes Other        grandmother  . Breast cancer Other        aunt  . Ovarian cancer  Other        cousin  . Rheum arthritis Father   . Colon cancer Neg Hx     Review of Systems: A 12-system review of systems was performed and was negative except as noted in the HPI.  --------------------------------------------------------------------------------------------------  Physical Exam: BP 138/60 (BP Location: Left Arm, Patient Position: Sitting, Cuff Size: Normal)   Pulse 84   Ht 5\' 1"  (1.549 m)   Wt 150 lb 6 oz (68.2 kg)   SpO2 97%   BMI 28.41 kg/m   General:  NAD.  Accompanied by her son. Neck: No JVD or HJR. Lungs: Clear to auscultation bilaterally without wheezes or crackles. Heart: Regular rate and rhythm without murmurs, rubs, or gallops. Abdomen: Soft, nontender, nondistended. Extremities: No lower extremity edema.  EKG:  NSR with low voltage.  Otherwise, no significant abnormality.  No significant change from prior tracing on 06/12/2019.  Lab Results  Component Value Date   WBC 6.8 08/01/2019   HGB 13.7 08/01/2019   HCT 42.4 08/01/2019   MCV 88.1 08/01/2019   PLT 269 08/01/2019    Lab Results  Component Value Date   NA 139 08/01/2019   K 4.1 08/01/2019   CL 101 08/01/2019   CO2 25 08/01/2019   BUN 17 08/01/2019   CREATININE 0.74 08/01/2019   GLUCOSE 100 (H) 08/01/2019   ALT 23 11/26/2018    Lab Results  Component Value Date   CHOL 306 (H) 06/12/2019   HDL 63 06/12/2019   LDLCALC 202 (H) 06/12/2019   LDLDIRECT 188.3 (H) 06/12/2019   TRIG 206 (H) 06/12/2019   CHOLHDL 4.9 06/12/2019    --------------------------------------------------------------------------------------------------  ASSESSMENT AND PLAN: Fatigue: Stable and likely multifactorial with extensive prior cardiac workup not revealing underlying cause.  We will defer further testing at this time.  Continue follow-up with Dr. Bea Graff and other providers at [facility] regarding back pain and management of psychotropic medications that may be contributing.  Hyperlipidemia: LDL 188 on  last check in 05/2019.  Unfortunately, Ms. Hattabaugh did not tolerate low-dose rosuvastatin.  Given lack of established ASCVD, we have agreed to a trial of lifestyle modifications with repeat lipid panel in ~6 months to assess response.  Hypertension: Blood pressure upper normal today.  No medication changes at this time; continue low-dose amlodipine.  Follow-up: Return to clinic in 6 months.  [name], MD 04/18/2020 5:07 PM

## 2020-04-17 NOTE — Patient Instructions (Signed)
Information on Mediterranean diet provided.   Medication Instructions:  Your physician recommends that you continue on your current medications as directed. Please refer to the Current Medication list given to you today.  *If you need a refill on your cardiac medications before your next appointment, please call your pharmacy*  Follow-Up: At [facility], you and your health needs are our priority.  As part of our continuing mission to provide you with exceptional heart care, we have created designated Provider Care Teams.  These Care Teams include your primary Cardiologist (physician) and Advanced Practice Providers (APPs -  Physician Assistants and Nurse Practitioners) who all work together to provide you with the care you need, when you need it.  We recommend signing up for the patient portal called "MyChart".  Sign up information is provided on this After Visit Summary.  MyChart is used to connect with patients for Virtual Visits (Telemedicine).  Patients are able to view lab/test results, encounter notes, upcoming appointments, etc.  Non-urgent messages can be sent to your provider as well.   To learn more about what you can do with MyChart, go to [email or web address].    Your next appointment:   6 month(s) in the morning for fasting lipid lab work.   The format for your next appointment:   In Person  Provider:   You may see [name], MD or one of the following Advanced Practice Providers on your designated Care Team:    Murray Hodgkins, NP  Christell Faith, PA-C  Marrianne Mood, PA-C  Cadence Graettinger, [place]  Laurann Montana, NP

## 2020-04-18 ENCOUNTER — Encounter: Payer: Self-pay | Admitting: Internal Medicine

## 2020-04-22 NOTE — Addendum Note (Signed)
Addended by: Britt Bottom on: 04/22/2020 01:05 PM   Modules accepted: Orders

## 2020-08-06 ENCOUNTER — Ambulatory Visit (INDEPENDENT_AMBULATORY_CARE_PROVIDER_SITE_OTHER): Payer: Medicare Other

## 2020-08-06 ENCOUNTER — Encounter: Payer: Self-pay | Admitting: Internal Medicine

## 2020-08-06 ENCOUNTER — Other Ambulatory Visit: Payer: Self-pay

## 2020-08-06 ENCOUNTER — Ambulatory Visit (INDEPENDENT_AMBULATORY_CARE_PROVIDER_SITE_OTHER): Payer: Medicare Other | Admitting: Internal Medicine

## 2020-08-06 VITALS — BP 130/60 | HR 63 | Ht 61.0 in | Wt 152.0 lb

## 2020-08-06 DIAGNOSIS — R000 Tachycardia, unspecified: Secondary | ICD-10-CM

## 2020-08-06 DIAGNOSIS — R0600 Dyspnea, unspecified: Secondary | ICD-10-CM

## 2020-08-06 DIAGNOSIS — R002 Palpitations: Secondary | ICD-10-CM

## 2020-08-06 DIAGNOSIS — R0602 Shortness of breath: Secondary | ICD-10-CM

## 2020-08-06 DIAGNOSIS — I10 Essential (primary) hypertension: Secondary | ICD-10-CM | POA: Diagnosis not present

## 2020-08-06 MED ORDER — NITROGLYCERIN 0.4 MG SL SUBL: 0.4000 mg | SUBLINGUAL_TABLET | SUBLINGUAL | 2 refills | Status: DC | PRN

## 2020-08-06 NOTE — Patient Instructions (Signed)
Medication Instructions:  No changes at this time.  *If you need a refill on your cardiac medications before your next appointment, please call your pharmacy*   Lab Work: None  If you have labs (blood work) drawn today and your tests are completely normal, you will receive your results only by: Marland Kitchen MyChart Message (if you have MyChart) OR . A paper copy in the mail If you have any lab test that is abnormal or we need to change your treatment, we will call you to review the results.   Testing/Procedures: Your physician has requested that you have an echocardiogram. Echocardiography is a painless test that uses sound waves to create images of your heart. It provides your doctor with information about the size and shape of your heart and how well your heart's chambers and valves are working. This procedure takes approximately one hour. There are no restrictions for this procedure.  Your physician has recommended that you wear a Zio monitor for 2 weeks. Remove on June 9 th and put back in prepaid box.  This monitor is a medical device that records the heart's electrical activity. Doctors most often use these monitors to diagnose arrhythmias. Arrhythmias are problems with the speed or rhythm of the heartbeat. The monitor is a small device applied to your chest. You can wear one while you do your normal daily activities. While wearing this monitor if you have any symptoms to push the button and record what you felt. Once you have worn this monitor for the period of time provider prescribed (Usually 14 days), you will return the monitor device in the postage paid box. Once it is returned they will download the data collected and provide [place] with a report which the provider will then review and we will call you with those results. Important tips:  1. Avoid showering during the first 24 hours of wearing the monitor. 2. Avoid excessive sweating to help maximize wear time. 3. Do not submerge the device, no  hot tubs, and no swimming pools. 4. Keep any lotions or oils away from the patch. 5. After 24 hours you may shower with the patch on. Take brief showers with your back facing the shower head.  6. Do not remove patch once it has been placed because that will interrupt data and decrease adhesive wear time. 7. Push the button when you have any symptoms and write down what you were feeling. 8. Once you have completed wearing your monitor, remove and place into box which has postage paid and place in your outgoing mailbox.  9. If for some reason you have misplaced your box then call our office and we can provide another box and/or mail it off for you.       Follow-Up: At [facility], you and your health needs are our priority.  As part of our continuing mission to provide you with exceptional heart care, we have created designated Provider Care Teams.  These Care Teams include your primary Cardiologist (physician) and Advanced Practice Providers (APPs -  Physician Assistants and Nurse Practitioners) who all work together to provide you with the care you need, when you need it.  We recommend signing up for the patient portal called "MyChart".  Sign up information is provided on this After Visit Summary.  MyChart is used to connect with patients for Virtual Visits (Telemedicine).  Patients are able to view lab/test results, encounter notes, upcoming appointments, etc.  Non-urgent messages can be sent to your provider as well.  To learn more about what you can do with MyChart, go to [email or web address].    Your next appointment:   6 week(s)  The format for your next appointment:   In Person  Provider:   You may see [name], MD or one of the following Advanced Practice Providers on your designated Care Team:    Murray Hodgkins, NP  Christell Faith, PA-C  Marrianne Mood, PA-C  Cadence Kuna, [place]  Laurann Montana, NP

## 2020-08-06 NOTE — Progress Notes (Signed)
Follow-up Outpatient Visit Date: 08/06/2020  Primary Care Provider: Raina Mina., MD [place] [id number]  Chief Complaint: Hospital follow-up  HPI:  Ms. Mankin is a 87 y.o. female with history of mitral valve prolapse, hypertension, hyperlipidemia, and chronic back pain, who presents for follow-up of palpitations leading to recent ED visit/hospitalization in [place].  Patient was in her usual state of health and underwent routine steroid injection into her back last week.  That night, she had sudden onset of tachypalpitations accompanied by mild shortness of breath.  Her heart rate was reportedly around 160 bpm.  Her blood pressure was also quite elevated (191/120).  Her son gave her an extra dose of clonazepam, which did not provide immediate relief.  Therefore, he took her to the [facility] emergency department.  By the time she arrived there, she was already feeling better.  Her son notes that her heart rate had come down to 120 bpm in the ED.  She was admitted for overnight observation.  Per their report, no obvious cause for her tachycardia was identified (records are not available for my review).  She did not have any chest pain, lightheadedness and had otherwise been feeling like her usual self leading up to the event.  Her son notes that a similar episode of palpitations has happened in the remote past following a back injection.  Ms. Springston otherwise has been doing relatively well and notes improvement in her chronic back pain following back neurostimulator earlier this year.  -------------------------------------------------------------------------------------------------  Cardiovascular History & Procedures: Cardiovascular Problems:  Chest pain  Mitral valve prolapse  Chronic fatigue  Risk Factors:  Hypertension, hyperlipidemia, age > 8  Cath/PCI:  LHC (06/06/16, [facility]): Normal coronary arteries. LVEF 65%. LVEDP 9 mmHg.  CV  Surgery:  None  EP Procedures and Devices:  None  Non-Invasive Evaluation(s):  Pharmacologic MPI (12/10/2018): Low risk study without ischemia or scar. LVEF 55-65%.  TTE (08/19/10): Normal LV size and systolic function. Impaired relaxation noted. Normal RV size and function. Mild TR.   Recent CV Pertinent Labs: Lab Results  Component Value Date   CHOL 306 (H) 06/12/2019   HDL 63 06/12/2019   LDLCALC 202 (H) 06/12/2019   LDLDIRECT 188.3 (H) 06/12/2019   TRIG 206 (H) 06/12/2019   CHOLHDL 4.9 06/12/2019   K 4.1 08/01/2019   BUN 17 08/01/2019   CREATININE 0.74 08/01/2019    Past medical and surgical history were reviewed and updated in EPIC.  Current Meds  Medication Sig  . amitriptyline (ELAVIL) 50 MG tablet Take 50 mg by mouth at bedtime.   Marland Kitchen amLODipine (NORVASC) 2.5 MG tablet Take 2.5 mg by mouth daily.  . Cholecalciferol (VITAMIN D PO) Take 1 tablet by mouth daily.  . citalopram (CELEXA) 20 MG tablet Take 20 mg by mouth daily.  . famotidine (PEPCID) 20 MG tablet Take 1 tablet (20 mg total) by mouth 2 (two) times daily as needed for heartburn or indigestion.  Marland Kitchen levothyroxine (SYNTHROID, LEVOTHROID) 25 MCG tablet Take 62.5 mcg by mouth daily.  . Multiple Vitamins-Minerals (MULTIVITAMIN WITH MINERALS) tablet Take 1 tablet by mouth daily.  . nitroGLYCERIN (NITROSTAT) 0.4 MG SL tablet Place 1 tablet (0.4 mg total) under the tongue every 5 (five) minutes as needed for chest pain. Maximum of 3 doses.  Marland Kitchen perphenazine (TRILAFON) 4 MG tablet Take 2 mg by mouth at bedtime.   . pregabalin (LYRICA) 50 MG capsule Take 25 mg by mouth daily.  Allergies: Losartan, Tramadol, and Lisinopril  Social History   Tobacco Use  . Smoking status: Never Smoker  . Smokeless tobacco: Never Used  Vaping Use  . Vaping Use: Never used  Substance Use Topics  . Alcohol use: No  . Drug use: No    Family History  Problem Relation Age of Onset  . Heart disease Mother   . Heart disease  Other        aunt  . Diabetes Other        grandmother  . Breast cancer Other        aunt  . Ovarian cancer Other        cousin  . Rheum arthritis Father   . Colon cancer Neg Hx     Review of Systems: A 12-system review of systems was performed and was negative except as noted in the HPI.  --------------------------------------------------------------------------------------------------  Physical Exam: BP 130/60 (BP Location: Right Arm, Patient Position: Sitting, Cuff Size: Normal)   Pulse 63   Ht 5\' 1"  (1.549 m)   Wt 152 lb (68.9 kg)   SpO2 94%   BMI 28.72 kg/m   General:  NAD. Neck: No JVD or HJR. Lungs: Clear to auscultation bilaterally without wheezes or crackles. Heart: Regular rate and rhythm without murmurs, rubs, or gallops. Abdomen: Soft, nontender, nondistended. Extremities: No lower extremity edema.  EKG: Normal sinus rhythm with poor R wave progression, likely reflecting lead placement.  No significant change from prior tracing on 04/17/2020..  Lab Results  Component Value Date   WBC 6.8 08/01/2019   HGB 13.7 08/01/2019   HCT 42.4 08/01/2019   MCV 88.1 08/01/2019   PLT 269 08/01/2019    Lab Results  Component Value Date   NA 139 08/01/2019   K 4.1 08/01/2019   CL 101 08/01/2019   CO2 25 08/01/2019   BUN 17 08/01/2019   CREATININE 0.74 08/01/2019   GLUCOSE 100 (H) 08/01/2019   ALT 23 11/26/2018    Lab Results  Component Value Date   CHOL 306 (H) 06/12/2019   HDL 63 06/12/2019   LDLCALC 202 (H) 06/12/2019   LDLDIRECT 188.3 (H) 06/12/2019   TRIG 206 (H) 06/12/2019   CHOLHDL 4.9 06/12/2019    --------------------------------------------------------------------------------------------------  ASSESSMENT AND PLAN: Palpitations: Ms. Krul reports an episode of tachypalpitations lasting 5 to 10 minutes that led to recent ED visits/observation at [facility].  Unfortunately, records of this admission are not available though her son brings  lab results which did not show any significant abnormalities.  Single reported troponin was negligibly elevated at 0.1.  Prior ischemia evaluation has been unremarkable.  Her presentation is most consistent with a paroxysmal tachyarrhythmia such as SVT or atrial fibrillation.  EKG today demonstrates normal sinus rhythm.  I have recommended obtaining a 14-day event monitor and echocardiogram for further evaluation.  Shortness of breath: This occurred in the setting of palpitations reported above.  Patient's most recent echo available in our system was in 2012 and showed normal LVEF.  She has a history of mitral valve prolapse.  Given her tacky arrhythmia and associated dyspnea, I have recommended obtaining an echocardiogram at her convenience.  We will defer medication changes at this time.  Hypertension: Blood pressure upper normal today.  Continue amlodipine 2.5 mg daily.  Follow-up: Return to clinic in 6 weeks.   [name], MD 08/06/2020 2:48 PM

## 2020-08-07 ENCOUNTER — Encounter: Payer: Self-pay | Admitting: Internal Medicine

## 2020-08-07 DIAGNOSIS — R002 Palpitations: Secondary | ICD-10-CM | POA: Insufficient documentation

## 2020-08-19 DIAGNOSIS — R000 Tachycardia, unspecified: Secondary | ICD-10-CM

## 2020-08-19 DIAGNOSIS — R0600 Dyspnea, unspecified: Secondary | ICD-10-CM

## 2020-09-04 ENCOUNTER — Other Ambulatory Visit: Payer: Self-pay | Admitting: Internal Medicine

## 2020-09-04 DIAGNOSIS — R0602 Shortness of breath: Secondary | ICD-10-CM

## 2020-09-18 ENCOUNTER — Other Ambulatory Visit: Payer: Self-pay

## 2020-09-18 ENCOUNTER — Ambulatory Visit (INDEPENDENT_AMBULATORY_CARE_PROVIDER_SITE_OTHER): Payer: Medicare Other

## 2020-09-18 DIAGNOSIS — R0602 Shortness of breath: Secondary | ICD-10-CM | POA: Diagnosis not present

## 2020-09-19 NOTE — Progress Notes (Deleted)
Cardiology Office Note    Date:  09/19/2020   ID:  Rebecca Stanley, DOB 10/20/1930, MRN [id number]  PCP:  Raina Mina., MD  Cardiologist:  [name], MD  Electrophysiologist:  None   Chief Complaint: Follow-up  History of Present Illness:   Rebecca Stanley is a 87 y.o. female with history of normal coronary arteries by LHC in 05/2016 at [facility], mitral valve prolapse, atrial tachycardia, HTN, HLD, and chronic back pain who presents for follow-up of recent echo and Zio patch.  Remote echo from 08/2010 showed normal LV systolic function and ventricular cavity size, impaired relaxation, normal RV systolic function and ventricular cavity size, and mild tricuspid regurgitation.  Most recent ischemic evaluation by Lexiscan MPI in 11/2018 showed no evidence of ischemia or scar with an LVEF of 55 to 65%.  She was admitted to outside hospital in 07/2020 with sudden onset of tachypalpitations accompanied by mild shortness of breath following steroid injection into her back.  Documentation indicates her heart rate was around 160 bpm.  Her BP was also quite elevated at 191/120.  Prior to her arrival to the hospital her symptoms were improving.  She was admitted for overnight observation with no obvious cause for her tachycardia being identified per patient.  Records were unavailable for review.  She did not have any chest pain or lightheadedness.  It was noted that her son reported a similar episode of palpitations occurring in the remote past following a back injection.  Subsequent outpatient cardiac monitoring in 07/2020 showed a predominant rhythm of sinus with an average rate of 78 bpm (range 49 to 143 bpm in sinus), rare PACs and PVCs, 3 atrial runs lasting up to 10 beats with a maximum rate of 162 bpm were noted, no sustained arrhythmias or prolonged pauses.  Patient triggered events corresponded to sinus rhythm.  Echo on 09/18/2020 demonstrated ***  ***   Labs independently  reviewed: 08/2020 - Hgb 11.8, PLT 294 06/2020 - magnesium 2.2, potassium 4.0, BUN 20, serum creatinine 0.8, albumin 4.1, AST/ALT normal, TSH normal 03/2020 - A1c 5.8 05/2019 - TC 306, TG 206, HDL 63, LDL 202  Past Medical History:  Diagnosis Date   Allergy    Arthritis    Blood transfusion    Cancer ([place])    Colitis    Colon polyp    Depression    Diverticulosis    Heart murmur    Hyperlipidemia    Hypertension    Mitral valve prolapse    Osteoporosis    Thyroid disease     Past Surgical History:  Procedure Laterality Date   BELPHAROPTOSIS REPAIR     bladder tack     CATARACT EXTRACTION     COLONOSCOPY     DILATION AND CURETTAGE, DIAGNOSTIC / THERAPEUTIC     gluteous medius tendon rupture     done at [facility]   heel tendon repair     HIP ARTHROSCOPY W/ LABRAL REPAIR     kyploplasty     T12, L1, L2, L3, done at [place]      Current Medications: No outpatient medications have been marked as taking for the 09/23/20 encounter (Appointment) with Rise Mu, PA-C.    Allergies:   Losartan, Tramadol, and Lisinopril   Social History   Socioeconomic History   Marital status: Widowed    Spouse name: Not on file   Number of children: 1   Years of education: Not on  file   Highest education level: Not on file  Occupational History   Occupation: retired  Tobacco Use   Smoking status: Never   Smokeless tobacco: Never  Vaping Use   Vaping Use: Never used  Substance and Sexual Activity   Alcohol use: No   Drug use: No   Sexual activity: Not on file  Other Topics Concern   Not on file  Social History Narrative   Not on file   Social Determinants of Health   Financial Resource Strain: Not on file  Food Insecurity: Not on file  Transportation Needs: Not on file  Physical Activity: Not on file  Stress: Not on file  Social Connections: Not on file     Family History:  The patient's family history includes Breast cancer in an other family member;  Diabetes in an other family member; Heart disease in her mother and another family member; Ovarian cancer in an other family member; Rheum arthritis in her father. There is no history of Colon cancer.  ROS:   ROS   EKGs/Labs/Other Studies Reviewed:    Studies reviewed were summarized above. The additional studies were reviewed today:  2D echo 09/18/2020: *** __________  Zio patch 07/2020: The patient was monitored for 13 days, 2 hours. The predominant rhythm was sinus with an average rate of 78 bpm (range 49-143 bpm and sinus). There were rare PACs and PVCs. 3 atrial runs lasting up to 10 beats with a maximum rate of 162 bpm were observed. No sustained arrhythmia or prolonged pause occurred. Patient triggered events correspond to sinus rhythm.   Predominantly sinus rhythm with rare PACs and PVCs as well as 3 brief episodes of PSVT.  No significant arrhythmia observed to explain patient's symptoms. __________  [facility] Adam MPI 11/2018: There was no ST segment deviation noted during stress. No T wave inversion was noted during stress. The study is normal. This is a low risk study. The left ventricular ejection fraction is normal (55-65%).   EKG:  EKG is ordered today.  The EKG ordered today demonstrates ***  Recent Labs: No results found for requested labs within last 8760 hours.  Recent Lipid Panel    Component Value Date/Time   CHOL 306 (H) 06/12/2019 1711   TRIG 206 (H) 06/12/2019 1711   HDL 63 06/12/2019 1711   CHOLHDL 4.9 06/12/2019 1711   VLDL 41 (H) 06/12/2019 1711   LDLCALC 202 (H) 06/12/2019 1711   LDLDIRECT 188.3 (H) 06/12/2019 1711    PHYSICAL EXAM:    VS:  There were no vitals taken for this visit.  BMI: There is no height or weight on file to calculate BMI.  Physical Exam  Wt Readings from Last 3 Encounters:  08/06/20 152 lb (68.9 kg)  04/17/20 150 lb 6 oz (68.2 kg)  06/12/19 145 lb 6 oz (65.9 kg)     ASSESSMENT & PLAN:   ***  Disposition: F/u with  Dr. Saunders Revel or an APP in ***.   Medication Adjustments/Labs and Tests Ordered: Current medicines are reviewed at length with the patient today.  Concerns regarding medicines are outlined above. Medication changes, Labs and Tests ordered today are summarized above and listed in the Patient Instructions accessible in Encounters.   Signed, Christell Faith, PA-C 09/19/2020 10:02 AM     [address] [place] [address], [place] [address] [phone]

## 2020-09-21 ENCOUNTER — Telehealth: Payer: Self-pay | Admitting: Internal Medicine

## 2020-09-21 LAB — ECHOCARDIOGRAM COMPLETE
AR max vel: 1.62 cm2
AV Area VTI: 1.89 cm2
AV Area mean vel: 1.94 cm2
AV Mean grad: 4 mmHg
AV Peak grad: 7.3 mmHg
Ao pk vel: 1.35 m/s
Area-P 1/2: 3.23 cm2
Calc EF: 52.7 %
S' Lateral: 2.5 cm
Single Plane A2C EF: 52.2 %
Single Plane A4C EF: 52.1 %

## 2020-09-21 NOTE — Telephone Encounter (Signed)
Patient son  calling to discuss recent echo  testing results   Please call

## 2020-09-21 NOTE — Telephone Encounter (Signed)
See result note, pt and son notified of results.

## 2020-09-22 NOTE — Telephone Encounter (Signed)
Patient was called to reschedule appt due to providers being out of office. Patients son asked about the fluid medication  and if she can go ahead and be placed on that  Please advise

## 2020-09-22 NOTE — Telephone Encounter (Signed)
Add Lasix 20 mg once daily with K-Dur 10 mEq once daily.  Check basic metabolic profile in 1 week.

## 2020-09-22 NOTE — Telephone Encounter (Signed)
Pt's son was called yesterday and given echo results below from echo completed 09/18/20:  [name], MD  09/21/2020  3:54 PM EDT      Please let Rebecca Stanley know that her heart is contracting well.  There is mild leakage of the mitral valve as well as some evidence of fluid retention.  She should follow-up as scheduled later this week to reassess her symptoms anddiscuss adding low-dose diuretic therapy.   Today, pt's follow up appt with Christell Faith, PA was cancelled by our clinic d/t provider emergency. Follow up was originally tomorrow 09/23/20.  Appt was rescheduled first available 11/06/20.   Pt's son asking if pt will need to be placed on diuretic prior to 11/06/20 based on echo results.  Per son, pt is not having new onset symptoms. Does have BLE edema that has been unchanged. Denies SOB.  Son is asking only based on echo results and wants to make sure pt does not need to start diuretic prior to next appt.  Dr. Saunders Revel out of the office this week. And Thurmond Butts also unavailable.  Will forward to provider in office to advise regarding diuretic.

## 2020-09-23 ENCOUNTER — Ambulatory Visit: Payer: Medicare Other | Admitting: Physician Assistant

## 2020-09-23 MED ORDER — FUROSEMIDE 20 MG PO TABS: 20.0000 mg | ORAL_TABLET | Freq: Every day | ORAL | 1 refills | Status: DC

## 2020-09-23 MED ORDER — POTASSIUM CHLORIDE ER 10 MEQ PO TBCR: 10.0000 meq | EXTENDED_RELEASE_TABLET | Freq: Every day | ORAL | 1 refills | Status: DC

## 2020-09-23 NOTE — Addendum Note (Signed)
Addended by: Darlyne Russian on: 09/23/2020 08:22 AM   Modules accepted: Orders

## 2020-09-23 NOTE — Telephone Encounter (Signed)
Spoke to pt's son, Richardson Landry, made aware of Dr. Tyrell Antonio recc.  Richardson Landry voiced understanding.  Pt will start Lasix 20 mg daily and Potassium 10 mEq daily.  Rx sent to pt's local pharmacy, confirmed with son.  Pt's son notified me that he was able to r/s follow up appt sooner to next Friday 10/02/20 with Ignacia Bayley, NP.  Notified son we can check BMET at that visit.  Appt notes updated. Pt's son appreciative and has no further questions at this time.

## 2020-10-02 ENCOUNTER — Other Ambulatory Visit: Payer: Self-pay

## 2020-10-02 ENCOUNTER — Ambulatory Visit (INDEPENDENT_AMBULATORY_CARE_PROVIDER_SITE_OTHER): Payer: Medicare Other | Admitting: Nurse Practitioner

## 2020-10-02 ENCOUNTER — Encounter: Payer: Self-pay | Admitting: Nurse Practitioner

## 2020-10-02 VITALS — BP 122/60 | HR 62 | Ht 61.0 in | Wt 152.0 lb

## 2020-10-02 DIAGNOSIS — R002 Palpitations: Secondary | ICD-10-CM

## 2020-10-02 DIAGNOSIS — E782 Mixed hyperlipidemia: Secondary | ICD-10-CM | POA: Diagnosis not present

## 2020-10-02 DIAGNOSIS — I10 Essential (primary) hypertension: Secondary | ICD-10-CM | POA: Diagnosis not present

## 2020-10-02 DIAGNOSIS — I5032 Chronic diastolic (congestive) heart failure: Secondary | ICD-10-CM

## 2020-10-02 MED ORDER — POTASSIUM CHLORIDE ER 10 MEQ PO TBCR: 10.0000 meq | EXTENDED_RELEASE_TABLET | Freq: Every day | ORAL | 3 refills | Status: DC

## 2020-10-02 MED ORDER — FUROSEMIDE 20 MG PO TABS: 20.0000 mg | ORAL_TABLET | Freq: Every day | ORAL | 3 refills | Status: DC

## 2020-10-02 NOTE — Progress Notes (Signed)
Office Visit    Patient Name: Rebecca Stanley Date of Encounter: 10/02/2020  Primary Care Provider:  Raina Mina., MD Primary Cardiologist:  [name], MD  Chief Complaint    87 year old female with a history of mitral valve prolapse, hypertension, hyperlipidemia, and chronic back pain, who presents for follow-up related to palpitations and HFpEF.  Past Medical History    Past Medical History:  Diagnosis Date   (HFpEF) heart failure with preserved ejection fraction ([place])    a. 09/2020 Echo: EF 55-60%, no rwma, Gr1DD, nl RV size/fxn. RVSP 48.57mHg. Mild LAE. Mild MR.   Allergy    Arthritis    Blood transfusion    Cancer ([facility]    Colitis    Colon polyp    Depression    Diverticulosis    Heart murmur    History of cardiac catheterization    a. 05/2016 Cath ([place]): Nl cors. EF 65%. LVEDP 977mg.   History of stress test    a. 11/2018 MV: EF 55-65%, no ischemia/infarct.   Hyperlipidemia    Hypertension    Mitral valve prolapse    a. 09/2020 Echo: Mild MR.   Osteoporosis    Palpitations    a. 07/2020 reported tachycardia-->08/2020 Zio: RSR 78 (49-143). Rare PACs/PVCs. 3 atrial runs up to 10 beats, max 162 bpm. Triggered events = sinus rhythm.   Thyroid disease    Past Surgical History:  Procedure Laterality Date   BELPHAROPTOSIS REPAIR     bladder tack     CATARACT EXTRACTION     COLONOSCOPY     DILATION AND CURETTAGE, DIAGNOSTIC / THERAPEUTIC     gluteous medius tendon rupture     done at [facility]   heel tendon repair     HIP ARTHROSCOPY W/ LABRAL REPAIR     kyploplasty     T12, L1, L2, L3[facility]      Allergies  Allergies  Allergen Reactions   Losartan Swelling    Facial swelling   Tramadol     Other reaction(s): Other (See Comments), Unknown Panic attacks with other meds that she is taking     Lisinopril Cough    History of Present Illness    [age]ear old female with the above past medical history including mitral  valve prolapse, hypertension, hyperlipidemia, and chronic back pain.  She [facility]showing normal coronaries with an EF of 65%.  She subsequently underwent stress testing in September 2020 showing EF 55 to 65% without ischemia or infarct.  She was recently evaluated by Dr. EnSaunders Reveln May of this year [facility] and [facility]cardia.  Records have not been available for review.  Patient wore a Zio monitor in June of this year which did not show any significant arrhythmias.  Triggered events were associated with sinus rhythm.  Echocardiogram earlier this month showed an EF of 55 to [id number] 1 diastolic dysfunction, and an elevated RVSP at 48.2 mmHg.  In that setting, she was prescribed Lasix 20 mg daily as well as potassium chloride 10 mEq daily.  Today, [name] chronic fatigue, which has been present for several years.  She does not experience chest pain or dyspnea though her activity is quite limited.  She is tolerating Lasix and potassium but has not noticed a significant change in either urine output or weight.  She denies PND, orthopnea, dizziness,  syncope, or early satiety.  She has somewhat chronic nonpitting lower extremity swelling which she does not feel has changed.  This is primarily around her joints.  Home Medications    Current Outpatient Medications  Medication Sig Dispense Refill   amitriptyline (ELAVIL) 50 MG tablet Take 50 mg by mouth at bedtime.      amLODipine (NORVASC) 2.5 MG tablet Take 2.5 mg by mouth daily.     Cholecalciferol (VITAMIN D PO) Take 1 tablet by mouth daily.     citalopram (CELEXA) 20 MG tablet Take 20 mg by mouth daily.     DULoxetine (CYMBALTA) 20 MG capsule Take by mouth.     famotidine (PEPCID) 20 MG tablet Take 1 tablet (20 mg total) by mouth 2 (two) times daily as needed for heartburn or indigestion.      levothyroxine (SYNTHROID, LEVOTHROID) 25 MCG tablet Take 62.5 mcg by mouth daily.     mirabegron ER (MYRBETRIQ) 50 MG TB24 tablet Take by mouth.     Multiple Vitamins-Minerals (MULTIVITAMIN WITH MINERALS) tablet Take 1 tablet by mouth daily.     nitroGLYCERIN (NITROSTAT) 0.4 MG SL tablet Place 1 tablet (0.4 mg total) under the tongue every 5 (five) minutes as needed for chest pain. Maximum of 3 doses. 25 tablet 2   OXcarbazepine (TRILEPTAL) 150 MG tablet oxcarbazepine 150 mg tablet     perphenazine (TRILAFON) 4 MG tablet Take 2 mg by mouth at bedtime.      pregabalin (LYRICA) 50 MG capsule Take 25 mg by mouth daily.     furosemide (LASIX) 20 MG tablet Take 1 tablet (20 mg total) by mouth daily. 90 tablet 3   potassium chloride (KLOR-CON) 10 MEQ tablet Take 1 tablet (10 mEq total) by mouth daily. 90 tablet 3   No current facility-administered medications for this visit.     Review of Systems    She denies chest pain, dyspnea, palpitations, PND, orthopnea, dizziness, syncope, or pitting edema.  She has chronic fatigue which is unchanged.  All other systems reviewed and are otherwise negative except as noted above.  Physical Exam    VS:  BP 122/60 (BP Location: Left Arm, Patient Position: Sitting, Cuff Size: Normal)   Pulse 62   Ht '5\' 1"'$  (1.549 m)   Wt 152 lb (68.9 kg)   SpO2 96%   BMI 28.72 kg/m  , BMI Body mass index is 28.72 kg/m.     GEN: Well nourished, well developed, in no acute distress. HEENT: normal. Neck: Supple, no JVD, carotid bruits, or masses. Cardiac: RRR, no murmurs, rubs, or gallops. No clubbing, cyanosis, edema.  Radials 2+/PT 1+ and equal bilaterally.  Respiratory:  Respirations regular and unlabored, clear to auscultation bilaterally. GI: Soft, nontender, nondistended, BS + x 4. MS: no deformity or atrophy. Skin: warm and dry, no rash. Neuro:  Strength and sensation are intact. Psych: Normal affect.  Accessory Clinical Findings    Labs dated September 28, 2020  from care everywhere Sodium 138, potassium 4.6, chloride 102, CO2 29, BUN 21, creatinine 0.78, glucose 132 Calcium 9.4 Total bilirubin 0.6, alkaline phosphatase 71, AST 20, ALT 13 Albumin 4.1, total protein 7.1 Hemoglobin A1c 5.8 Hemoglobin 12.2, hematocrit 36.5, WBC 4.8, platelets 300 Total cholesterol 270, triglycerides 214, HDL 58, LDL 175  Assessment & Plan    1.  HFpEF: Patient with recent echo showing normal LV function and elevated RVSP at 48.2 mmHg.  She has chronic fatigue but does not experience chest pain or dyspnea, though  notably, activity is quite limited.  She is now on Lasix 20 mg daily as well as potassium chloride 10 mEq daily.  She is euvolemic on examination today.  Heart rate and blood pressure well controlled.  She did have follow-up labs earlier this week showing stable renal function and potassium.  We discussed the importance of daily weights, sodium restriction, medication compliance, and symptom reporting and she verbalizes understanding.  No med changes today.  2.  Palpitations: Quiescent.  Recent monitoring did not show any significant arrhythmias.  Triggered events were associate with sinus rhythm.  3.  Essential hypertension: Stable on calcium channel blocker and diuretic therapy.  4.  Hyperlipidemia: Recent lipids notable for LDL of 175.  This has been followed by primary care.  She was previously on a statin but this was discontinued following normal coronary arteries on catheterization 2018 recent primary care note indicates preference to remain off of lipid-lowering therapy.  5.  Disposition: Follow-up in 3 months or sooner if necessary.  Murray Hodgkins, NP 10/02/2020, 2:16 PM

## 2020-10-02 NOTE — Patient Instructions (Signed)
Medication Instructions:  Refills sent in as requested.   *If you need a refill on your cardiac medications before your next appointment, please call your pharmacy*   Lab Work: None  If you have labs (blood work) drawn today and your tests are completely normal, you will receive your results only by: [place] (if you have MyChart) OR A paper copy in the mail If you have any lab test that is abnormal or we need to change your treatment, we will call you to review the results.   Testing/Procedures: None    Follow-Up: At [facility], you and your health needs are our priority.  As part of our continuing mission to provide you with exceptional heart care, we have created designated Provider Care Teams.  These Care Teams include your primary Cardiologist (physician) and Advanced Practice Providers (APPs -  Physician Assistants and Nurse Practitioners) who all work together to provide you with the care you need, when you need it.   Your next appointment:   3-4 month(s)  The format for your next appointment:   In Person  Provider:   [name], MD or Murray Hodgkins, NP

## 2020-10-20 ENCOUNTER — Other Ambulatory Visit: Payer: Self-pay | Admitting: Cardiovascular Disease

## 2020-10-21 ENCOUNTER — Ambulatory Visit: Payer: Medicare Other | Admitting: Internal Medicine

## 2020-11-06 ENCOUNTER — Ambulatory Visit: Payer: Medicare Other | Admitting: Physician Assistant

## 2020-12-10 ENCOUNTER — Emergency Department (HOSPITAL_BASED_OUTPATIENT_CLINIC_OR_DEPARTMENT_OTHER)
Admission: EM | Admit: 2020-12-10 | Discharge: 2020-12-10 | Disposition: A | Discharge status: Home / Self Care | Payer: Medicare Other | Attending: Emergency Medicine | Admitting: Emergency Medicine

## 2020-12-10 ENCOUNTER — Other Ambulatory Visit: Payer: Self-pay

## 2020-12-10 ENCOUNTER — Encounter (HOSPITAL_BASED_OUTPATIENT_CLINIC_OR_DEPARTMENT_OTHER): Payer: Self-pay | Admitting: *Deleted

## 2020-12-10 DIAGNOSIS — R5383 Other fatigue: Secondary | ICD-10-CM | POA: Diagnosis present

## 2020-12-10 DIAGNOSIS — Z85828 Personal history of other malignant neoplasm of skin: Secondary | ICD-10-CM | POA: Insufficient documentation

## 2020-12-10 DIAGNOSIS — U071 COVID-19: Secondary | ICD-10-CM | POA: Insufficient documentation

## 2020-12-10 DIAGNOSIS — I10 Essential (primary) hypertension: Secondary | ICD-10-CM | POA: Insufficient documentation

## 2020-12-10 DIAGNOSIS — Z79899 Other long term (current) drug therapy: Secondary | ICD-10-CM | POA: Insufficient documentation

## 2020-12-10 NOTE — Discharge Instructions (Addendum)
As you are able to walk today and your oxygen remained normal it does not appear that you would significantly benefit from a hospital admission at this time. If you develop swelling in 1 leg, chest pain, significant shortness of breath, feel like you cannot pass out, or have any other concerns please do not hesitate to seek additional medical care and evaluation.

## 2020-12-10 NOTE — ED Triage Notes (Signed)
Covid + today , Sx  x 4 days, body aches , cough , sore throat

## 2020-12-10 NOTE — ED Provider Notes (Signed)
[facility] Provider Note   CSN: [id number] Arrival date & time: 12/10/20  1700     History Chief Complaint  Patient presents with   Covid Positive    Rebecca Stanley is a 87 y.o. female with a past medical history of HF PR AF, cancer, palpitations, thyroid disease, who presents today for evaluation of fatigue.  She is on day 4 of COVID symptoms, tested positive today.  Her PCP already sent a prescription for Paxil bid.  She denies any nausea vomiting or diarrhea, she feels overall globally weak and has a sore throat with cough.  She denies any significant chest pain or shortness of breath.  She has not had any syncopal events.  She has not yet started taking the Paxlovid.   HPI     Past Medical History:  Diagnosis Date   (HFpEF) heart failure with preserved ejection fraction ([place])    a. 09/2020 Echo: EF 55-60%, no rwma, Gr1DD, nl RV size/fxn. RVSP 48.9mmHg. Mild LAE. Mild MR.   Allergy    Arthritis    Blood transfusion    Cancer [facility])    Colitis    Colon polyp    Depression    Diverticulosis    Heart murmur    History of cardiac catheterization    a. 05/2016 Cath ([facility]): Nl cors. EF 65%. LVEDP 37mmHg.   History of stress test    a. 11/2018 MV: EF 55-65%, no ischemia/infarct.   Hyperlipidemia    Hypertension    Mitral valve prolapse    a. 09/2020 Echo: Mild MR.   Osteoporosis    Palpitations    a. 07/2020 reported tachycardia-->08/2020 [facility]: RSR 78 (49-143). Rare PACs/PVCs. 3 atrial runs up to 10 beats, max 162 bpm. Triggered events = sinus rhythm.   Thyroid disease     Patient Active Problem List   Diagnosis Date Noted   Palpitations 08/07/2020   Lower extremity edema 12/27/2018   Chronic fatigue 08/18/2017   Essential hypertension 07/11/2017   Pulmonary nodule 08/04/2016   Shortness of breath 08/16/2010   Mitral valve prolapse    Mixed hyperlipidemia    DIARRHEA 04/05/2010   DIVERTICULOSIS, COLON 04/02/2010   CONSTIPATION,  CHRONIC 04/02/2010   COLONIC POLYPS, BENIGN, HX OF 04/02/2010    Past Surgical History:  Procedure Laterality Date   BELPHAROPTOSIS REPAIR     bladder tack     CATARACT EXTRACTION     COLONOSCOPY     DILATION AND CURETTAGE, DIAGNOSTIC / THERAPEUTIC     gluteous medius tendon rupture     done at [facility]   heel tendon repair     HIP ARTHROSCOPY W/ LABRAL REPAIR     kyploplasty     T12, L1, L2, L3, done at [place]       OB History   No obstetric history on file.     Family History  Problem Relation Age of Onset   Heart disease Mother    Heart disease Other        aunt   Diabetes Other        grandmother   Breast cancer Other        aunt   Ovarian cancer Other        cousin   Rheum arthritis Father    Colon cancer Neg Hx     Social History   Tobacco Use   Smoking status: Never   Smokeless tobacco: Never  Vaping Use  Vaping Use: Never used  Substance Use Topics   Alcohol use: No   Drug use: No    Home Medications Prior to Admission medications   Medication Sig Start Date End Date Taking? Authorizing Provider  amitriptyline (ELAVIL) 50 MG tablet Take 50 mg by mouth at bedtime.  12/25/18   [provider]  amLODipine (NORVASC) 2.5 MG tablet Take 2.5 mg by mouth daily.    [provider]  Cholecalciferol (VITAMIN D PO) Take 1 tablet by mouth daily.    [provider]  citalopram (CELEXA) 20 MG tablet Take 20 mg by mouth daily.    [provider]  DULoxetine (CYMBALTA) 20 MG capsule Take by mouth. 05/13/20   [provider]  famotidine (PEPCID) 20 MG tablet Take 1 tablet (20 mg total) by mouth 2 (two) times daily as needed for heartburn or indigestion. 06/12/19   End, Harrell Gave, MD  furosemide (LASIX) 20 MG tablet TAKE 1 TABLET(20 MG) BY MOUTH DAILY 10/20/20   Theora Gianotti, NP  levothyroxine (SYNTHROID, LEVOTHROID) 25 MCG tablet Take 62.5 mcg by mouth daily.    [provider]   mirabegron ER (MYRBETRIQ) 50 MG TB24 tablet Take by mouth. 08/13/20   [provider]  Multiple Vitamins-Minerals (MULTIVITAMIN WITH MINERALS) tablet Take 1 tablet by mouth daily.    [provider]  nitroGLYCERIN (NITROSTAT) 0.4 MG SL tablet Place 1 tablet (0.4 mg total) under the tongue every 5 (five) minutes as needed for chest pain. Maximum of 3 doses. 08/06/20   End, Harrell Gave, MD  OXcarbazepine (TRILEPTAL) 150 MG tablet oxcarbazepine 150 mg tablet 08/28/20   [provider]  perphenazine (TRILAFON) 4 MG tablet Take 2 mg by mouth at bedtime.  12/24/18   [provider]  potassium chloride (KLOR-CON) 10 MEQ tablet TAKE 1 TABLET(10 MEQ) BY MOUTH DAILY 10/20/20   Theora Gianotti, NP  pregabalin (LYRICA) 50 MG capsule Take 25 mg by mouth daily.    [provider]    Allergies    Losartan, Tramadol, and Lisinopril  Review of Systems   Review of Systems  Constitutional:  Positive for chills and fatigue. Negative for fever.  HENT:  Negative for congestion.   Respiratory:  Positive for cough. Negative for chest tightness and shortness of breath.   Cardiovascular:  Negative for chest pain, palpitations and leg swelling.  Gastrointestinal:  Negative for abdominal pain, diarrhea, nausea and vomiting.  Musculoskeletal:  Negative for back pain and neck pain.  Skin:  Negative for color change and rash.  Neurological:  Positive for weakness (Global).  Psychiatric/Behavioral:  Negative for confusion.   All other systems reviewed and are negative.  Physical Exam Updated Vital Signs BP (!) 136/58 (BP Location: Left Arm)   Pulse 70   Temp 98.2 F (36.8 C) (Oral)   Resp 18   Ht 5\' 1"  (1.549 m)   Wt 68 kg   SpO2 99%   BMI 28.34 kg/m   Physical Exam Vitals and nursing note reviewed.  Constitutional:      General: She is not in acute distress.    Appearance: She is not ill-appearing.  HENT:     Head: Normocephalic and atraumatic.      Mouth/Throat:     Mouth: Mucous membranes are moist.     Pharynx: Oropharynx is clear. No oropharyngeal exudate or posterior oropharyngeal erythema.  Eyes:     Conjunctiva/sclera: Conjunctivae normal.  Cardiovascular:     Rate and Rhythm: Normal rate and  regular rhythm.     Pulses: Normal pulses.     Heart sounds: Normal heart sounds.  Pulmonary:     Effort: Pulmonary effort is normal. No respiratory distress.     Breath sounds: Normal breath sounds.  Abdominal:     General: There is no distension.  Musculoskeletal:     Cervical back: Normal range of motion and neck supple.     Right lower leg: No edema.     Left lower leg: No edema.     Comments: No obvious acute injury  Skin:    General: Skin is warm.  Neurological:     Mental Status: She is alert.     Comments: Awake and alert, answers all questions appropriately.  Speech is not slurred.  Psychiatric:        Mood and Affect: Mood normal.        Behavior: Behavior normal.   Myself and RN personally ambulated patient with a walker.  She has a walker at home.  She had a slow, steady gait, was able to ambulate approximately 15 feet without difficulties.  ED Results / Procedures / Treatments   Labs (all labs ordered are listed, but only abnormal results are displayed) Labs Reviewed - No data to display  EKG None  Radiology No results found.  Procedures Procedures   Medications Ordered in ED Medications - No data to display  ED Course  I have reviewed the triage vital signs and the nursing notes.  Pertinent labs & imaging results that were available during my care of the patient were reviewed by me and considered in my medical decision making (see chart for details).    MDM Rules/Calculators/A&P                          ALLINE PIO was evaluated in Emergency Department on 12/11/2020 for the symptoms described in the history of present illness. She was evaluated in the context of the global COVID-19 pandemic, which  necessitated consideration that the patient might be at risk for infection with the SARS-CoV-2 virus that causes COVID-19. Institutional protocols and algorithms that pertain to the evaluation of patients at risk for COVID-19 are in a state of rapid change based on information released by regulatory bodies including the CDC and federal and state organizations. These policies and algorithms were followed during the patient's care in the ED.  Patient is a 87 year old woman who presents today for evaluation of feeling globally weak in the setting of COVID.  She is on day 4 of COVID symptoms.  She was able to ambulate in the emergency room with a walker without difficulty or requiring any assistance, including independently getting up from the bed and getting back on the bed.  She did not become hypoxic during ambulating.  Her lungs are clear to auscultation bilaterally, she is 99% on room air.  No evidence of secondary bacterial infection, and no indication for chest x-ray.  She does not have any chest pain. Her primary care doctor has already prescribed her Paxil bid.  I discussed with patient and family in the room that it does not appear that she would benefit significantly from hospitalization at this time and does not appear to require hospitalization given that she is not hypoxic, able to ambulate, lives with family who can help her if needed.  Return precautions were discussed with patient who states their understanding.  At the time of discharge patient denied any  unaddressed complaints or concerns.  Patient is agreeable for discharge home.  Note: Portions of this report may have been transcribed using voice recognition software. Every effort was made to ensure accuracy; however, inadvertent computerized transcription errors may be present   Final Clinical Impression(s) / ED Diagnoses Final diagnoses:  COVID-19    Rx / DC Orders ED Discharge Orders     None        Lorin Glass,  PA-C 12/11/20 0014    Malvin Johns, MD 12/11/20 [phone]

## 2020-12-10 NOTE — ED Notes (Addendum)
First contact with patient. Patient arrived via triage from home with complaints of continued covid symptoms s/p day 4 of covid diagnosis. Patient states she is more tired while walking. Patient up with walker and ambulated without difficulty or assistance. Pt is A&OX 4. Respirations even/unlabored- Patient speaking in full sentences. Patient changed into gown and placed on pulse ox monitor and call light within reach. Patient updated on plan of care. Will continue to monitor patient.   1912: Report given to [facility] for continuation of acre. No acute distress noted upon this RN's departure of patient.

## 2021-01-20 ENCOUNTER — Other Ambulatory Visit: Payer: Self-pay

## 2021-01-20 ENCOUNTER — Ambulatory Visit (INDEPENDENT_AMBULATORY_CARE_PROVIDER_SITE_OTHER): Payer: Medicare Other | Admitting: Internal Medicine

## 2021-01-20 ENCOUNTER — Encounter: Payer: Self-pay | Admitting: Internal Medicine

## 2021-01-20 VITALS — BP 140/70 | HR 64 | Ht 61.0 in | Wt 152.0 lb

## 2021-01-20 DIAGNOSIS — R600 Localized edema: Secondary | ICD-10-CM | POA: Diagnosis not present

## 2021-01-20 DIAGNOSIS — R5382 Chronic fatigue, unspecified: Secondary | ICD-10-CM | POA: Diagnosis not present

## 2021-01-20 DIAGNOSIS — R55 Syncope and collapse: Secondary | ICD-10-CM | POA: Diagnosis not present

## 2021-01-20 MED ORDER — FUROSEMIDE 20 MG PO TABS: 20.0000 mg | ORAL_TABLET | ORAL | Status: DC | PRN

## 2021-01-20 NOTE — Patient Instructions (Signed)
Medication Instructions:   Your physician has recommended you make the following change in your medication:   CHANGE Furosemide (Lasix) 20 mg to AS NEEDED only for weight gain or swelling  *If you need a refill on your cardiac medications before your next appointment, please call your pharmacy*  Please ask your primary care provider / gerontologist / or psychiatry if any stimulants such as "Provigil" would benefit you.   Lab Work:  None ordered  Testing/Procedures:  None ordered   Follow-Up: At [facility], you and your health needs are our priority.  As part of our continuing mission to provide you with exceptional heart care, we have created designated Provider Care Teams.  These Care Teams include your primary Cardiologist (physician) and Advanced Practice Providers (APPs -  Physician Assistants and Nurse Practitioners) who all work together to provide you with the care you need, when you need it.  We recommend signing up for the patient portal called "MyChart".  Sign up information is provided on this After Visit Summary.  MyChart is used to connect with patients for Virtual Visits (Telemedicine).  Patients are able to view lab/test results, encounter notes, upcoming appointments, etc.  Non-urgent messages can be sent to your provider as well.   To learn more about what you can do with MyChart, go to [email or web address].    Your next appointment:   6 month(s)  The format for your next appointment:   In Person  Provider:   You may see [name], MD or one of the following Advanced Practice Providers on your designated Care Team:   Murray Hodgkins, NP Christell Faith, PA-C Cadence Kathlen Mody, [place]

## 2021-01-20 NOTE — Progress Notes (Signed)
Follow-up Outpatient Visit Date: 01/20/2021  Primary Care Provider: Raina Mina., MD [place] [id number]  Chief Complaint: Fatigue  HPI:  Ms. Rebecca Stanley is a 87 y.o. female with history of chronic HFpEF, mitral valve prolapse, hypertension, hyperlipidemia, and chronic back pain, who presents for follow-up of edema and palpitations.  She was last seen in our office in late May by Ignacia Bayley, NP, at which time she complained of longstanding chronic fatigue.  She had previously been started on low-dose furosemide for management of her HFpEF but did not notice any significant increase in urine output or weight loss.  No medication changes or additional testing were pursued at that time.  Today, ms. Rebecca Stanley complains primarily of fatigue, which has been a longstanding problem.  She feels like his gotten worse over the last ~6 months.  She also wonders if COVID-19 infection in 11/2020 may have contributed to her worsening edema.  She has frequent aching and and swelling in her legs.  She has been evaluated by a vascular surgery for assessment of venous insufficiency and is now seeing a physical therapy in [place] who specializes in the management of lymphedema.  She is wearing thigh-high compression stockings.  Lower extremity venous duplex was also notable for bilateral Baker's cysts.  She denies chest pain, shortness of breath, palpitations, and lightheadedness.  Her son reports that prior syncopal events earlier this year have not been attributed to panic attacks.  Furosemide has been decreased to every other day out of concern by her gerontologist at [facility] that daily dosing may be contributing to her fatigue.  She is still being weaned very slowly off amitriptyline and perphenazine.  --------------------------------------------------------------------------------------------------  Cardiovascular History & Procedures: Cardiovascular Problems: Chest pain Mitral valve prolapse Chronic  fatigue   Risk Factors: Hypertension, hyperlipidemia, age > 81   Cath/PCI: LHC (06/06/16, [facility]): Normal coronary arteries.  LVEF 65%.  LVEDP 9 mmHg.   CV Surgery: None   EP Procedures and Devices: 14-day event monitor (08/25/2020): Predominantly sinus rhythm with rare PACs and PVCs as well as 3 brief episodes of PSVT.  No significant arrhythmia observed to explain patient's symptoms.   Non-Invasive Evaluation(s): TTE (09/18/2020): Normal LV size and wall thickness.  LVEF 55-60% with grade 1 diastolic dysfunction.  Normal RV size and function.  Moderate pulmonary hypertension.  Mild left atrial enlargement.  Mild tricuspid regurgitation.  Mild aortic sclerosis without stenosis.  CVP 8 mmHg. Pharmacologic MPI (12/10/2018): Low risk study without ischemia or scar.  LVEF 55-65%. TTE (08/19/10): Normal LV size and systolic function.  Impaired relaxation noted.  Normal RV size and function.  Mild TR.  Recent CV Pertinent Labs: Lab Results  Component Value Date   CHOL 306 (H) 06/12/2019   HDL 63 06/12/2019   LDLCALC 202 (H) 06/12/2019   LDLDIRECT 188.3 (H) 06/12/2019   TRIG 206 (H) 06/12/2019   CHOLHDL 4.9 06/12/2019   K 4.1 08/01/2019   BUN 17 08/01/2019   CREATININE 0.74 08/01/2019    Past medical and surgical history were reviewed and updated in EPIC.  Current Meds  Medication Sig   amitriptyline (ELAVIL) 25 MG tablet Take 25 mg by mouth at bedtime.   Cholecalciferol (VITAMIN D PO) Take 1 tablet by mouth daily.   DULoxetine (CYMBALTA) 20 MG capsule Take 40 mg by mouth 2 (two) times daily.   famotidine (PEPCID) 20 MG tablet Take 1 tablet (20 mg total) by mouth 2 (two) times daily as needed for  heartburn or indigestion.   levothyroxine (SYNTHROID, LEVOTHROID) 25 MCG tablet Take 62.5 mcg by mouth daily.   mirabegron ER (MYRBETRIQ) 50 MG TB24 tablet Take by mouth.   Multiple Vitamins-Minerals (MULTIVITAMIN WITH MINERALS) tablet Take 1 tablet by mouth daily.   nitroGLYCERIN  (NITROSTAT) 0.4 MG SL tablet Place 1 tablet (0.4 mg total) under the tongue every 5 (five) minutes as needed for chest pain. Maximum of 3 doses.   perphenazine (TRILAFON) 4 MG tablet Take 1 mg by mouth at bedtime.   potassium chloride (KLOR-CON 10) 10 MEQ tablet Take 10 mEq by mouth every other day.   pregabalin (LYRICA) 50 MG capsule Take 25 mg by mouth daily.   [DISCONTINUED] furosemide (LASIX) 20 MG tablet Take 20 mg by mouth every other day.    Allergies: Losartan, Tramadol, and Lisinopril  Social History   Tobacco Use   Smoking status: Never   Smokeless tobacco: Never  Vaping Use   Vaping Use: Never used  Substance Use Topics   Alcohol use: No   Drug use: No    Family History  Problem Relation Age of Onset   Heart disease Mother    Heart disease Other        aunt   Diabetes Other        grandmother   Breast cancer Other        aunt   Ovarian cancer Other        cousin   Rheum arthritis Father    Colon cancer Neg Hx     Review of Systems: A 12-system review of systems was performed and was negative except as noted in the HPI.  --------------------------------------------------------------------------------------------------  Physical Exam: BP 140/70 (BP Location: Left Arm, Patient Position: Sitting, Cuff Size: Normal)   Pulse 64   Ht 5\' 1"  (1.549 m)   Wt 152 lb (68.9 kg)   SpO2 93%   BMI 28.72 kg/m   General:  NAD. Neck: No JVD or HJR. Lungs: Clear to auscultation bilaterally without wheezes or crackles. Heart: Regular rate and rhythm without murmurs, rubs, or gallops. Abdomen: Soft, nontender, nondistended. Extremities: Trace pretibial edema bilaterally with compression stockings in place.  2+ pedal pulses bilaterally.  EKG:  NSR with possible LAE.  No significant change from prior tracing on 08/06/2020.  Lab Results  Component Value Date   WBC 6.8 08/01/2019   HGB 13.7 08/01/2019   HCT 42.4 08/01/2019   MCV 88.1 08/01/2019   PLT 269 08/01/2019     Lab Results  Component Value Date   NA 139 08/01/2019   K 4.1 08/01/2019   CL 101 08/01/2019   CO2 25 08/01/2019   BUN 17 08/01/2019   CREATININE 0.74 08/01/2019   GLUCOSE 100 (H) 08/01/2019   ALT 23 11/26/2018    Lab Results  Component Value Date   CHOL 306 (H) 06/12/2019   HDL 63 06/12/2019   LDLCALC 202 (H) 06/12/2019   LDLDIRECT 188.3 (H) 06/12/2019   TRIG 206 (H) 06/12/2019   CHOLHDL 4.9 06/12/2019    --------------------------------------------------------------------------------------------------  ASSESSMENT AND PLAN: Fatigue: This has been longstanding and could have been exacerbated by COVID-19 earlier this year.  Extensive cardiac testing over the years has failed to explain her symptoms; I do not believe that the primary cause is cardiac in nature.  I think it is reasonable to use furosemide on a prn basis.  I encouraged Rebecca Stanley and her son to continue working with her multiple other providers and to enquire about  the utility of a stimulant like modafinil to help with her symptoms.  No further cardiac workup is indicated at this time.  Leg edema: Trace pretibial edema noted today with compression stockings in place.  Recent echo was notable only for grade 1 diastolic dysfunction, which is not unexpected given the patient's age.  I recommend continued compression stocking use and follow-up with her lymphedema specialist.  Furosemide 20 mg daily can be used as needed for increased leg swelling and/or weight gain.  Syncope: No further episodes reported.  Even monitor showed rare ectopy including a few brief atrial runs, which would not explain her syncope.  Given lack of further symptoms, we will defer additional workup/intervention.  Follow-up: Return to clinic in 6 months.  Rebecca Bush, MD 01/20/2021 7:19 PM

## 2021-01-29 ENCOUNTER — Telehealth: Payer: Self-pay | Admitting: Internal Medicine

## 2021-01-29 NOTE — Telephone Encounter (Signed)
Attempted to call pt's son, Rebecca Stanley, [place] approved.  No answer. No voicemail set up.

## 2021-01-29 NOTE — Telephone Encounter (Signed)
Pt c/o BP issue: STAT if pt c/o blurred vision, one-sided weakness or slurred speech  1. What are your last 5 BP readings? 140/70, 190/70, 172/71, 178/76, 178/74  2. Are you having any other symptoms (ex. Dizziness, headache, blurred vision, passed out)? headache  3. What is your BP issue? Has concerns of numbers

## 2021-02-01 NOTE — Telephone Encounter (Signed)
Spoke with pt's son Rebecca Stanley, [place] approved.  Pt had cortisone shot in shoulder on Friday 01/22/21. The following Monday, pt "did not feel well", had tremors, and SBP 200.  Rebecca Stanley took pt to urgent care and was taken to ER in [place] via EMS.  SBP was 228 at ER. Pt and son were told this very likely related to cortisone injection.  Clonidine given at ER and SBP decr to 159 prior to discharge same day.  Pt was given 15 days of Clonidine 0.1 mg BID and told to monitor BP closely with new BP med.  Pt also saw PCP and was started on HCTZ 25 mg daily.  Per Rebecca Stanley - pt is only taking HCTZ 25 mg in the morning, and he is trying Clonidine 0.1 mg in the evening but is afraid to give in daytime since it brought pt's BP down ~70 pts in the hospital.   With HCTZ 25 mg in AM and (Clonidine 0.1 mg only in PM) BP from 11/14-11/18 noted below:  140/70, 190/70, 172/71, 178/76, 178/74  Per Rebecca Stanley, pt does not have blurred vision, dizziness. Does report a mild headache.  This morning SBP 170 per Rebecca Stanley.  I did advise that pt take the AM Clonidine dose and monitor BP as was instructed at ER.  Notified Rebecca Stanley that I will make Dr. Saunders Revel aware for further recc.

## 2021-02-01 NOTE — Telephone Encounter (Signed)
Attempted to call pt's son, Rebecca Stanley, back this morning.  No answer. Lm with pt to have pt call back.

## 2021-02-03 NOTE — Telephone Encounter (Addendum)
Called the patients son Richardson Landry. Lmtcb on both of the telephone numbers listed for him. Adv that our office will be closed on 11/24 and 11/25 and will reopen 02/08/21 @ 8am.

## 2021-02-03 NOTE — Telephone Encounter (Signed)
I suspect elevated BP due to cortisone injection.  It is okay to use clonidine 0.1 mg BID prn for BP > 180/100.  The patient and her son should be aware that clonidine can be sedating.  I am hopeful that BP will return to baseline as effects of cortisone wane.  [name], MD [facility]

## 2021-02-03 NOTE — Telephone Encounter (Signed)
Spoke with the patient son Richardson Landry. Richardson Landry made aware of Dr. Darnelle Bos response and recommendation with verbalized understanding.

## 2021-02-03 NOTE — Telephone Encounter (Signed)
Patient son returning call.

## 2021-03-31 DIAGNOSIS — D509 Iron deficiency anemia, unspecified: Secondary | ICD-10-CM | POA: Insufficient documentation

## 2021-05-26 ENCOUNTER — Emergency Department (HOSPITAL_BASED_OUTPATIENT_CLINIC_OR_DEPARTMENT_OTHER): Payer: Medicare Other

## 2021-05-26 ENCOUNTER — Emergency Department (HOSPITAL_BASED_OUTPATIENT_CLINIC_OR_DEPARTMENT_OTHER)
Admission: EM | Admit: 2021-05-26 | Discharge: 2021-05-27 | Disposition: A | Discharge status: Home / Self Care | Payer: Medicare Other | Attending: Emergency Medicine | Admitting: Emergency Medicine

## 2021-05-26 ENCOUNTER — Encounter (HOSPITAL_BASED_OUTPATIENT_CLINIC_OR_DEPARTMENT_OTHER): Payer: Self-pay | Admitting: Emergency Medicine

## 2021-05-26 ENCOUNTER — Other Ambulatory Visit: Payer: Self-pay

## 2021-05-26 DIAGNOSIS — Z79899 Other long term (current) drug therapy: Secondary | ICD-10-CM | POA: Diagnosis not present

## 2021-05-26 DIAGNOSIS — Z20822 Contact with and (suspected) exposure to covid-19: Secondary | ICD-10-CM | POA: Insufficient documentation

## 2021-05-26 DIAGNOSIS — R0602 Shortness of breath: Secondary | ICD-10-CM | POA: Insufficient documentation

## 2021-05-26 DIAGNOSIS — J3489 Other specified disorders of nose and nasal sinuses: Secondary | ICD-10-CM | POA: Diagnosis not present

## 2021-05-26 DIAGNOSIS — R059 Cough, unspecified: Secondary | ICD-10-CM | POA: Diagnosis not present

## 2021-05-26 DIAGNOSIS — R0981 Nasal congestion: Secondary | ICD-10-CM | POA: Insufficient documentation

## 2021-05-26 DIAGNOSIS — E871 Hypo-osmolality and hyponatremia: Secondary | ICD-10-CM | POA: Insufficient documentation

## 2021-05-26 DIAGNOSIS — R5383 Other fatigue: Secondary | ICD-10-CM | POA: Diagnosis not present

## 2021-05-26 LAB — BASIC METABOLIC PANEL
Anion gap: 10 (ref 5–15)
BUN: 19 mg/dL (ref 8–23)
CO2: 27 mmol/L (ref 22–32)
Calcium: 9.4 mg/dL (ref 8.9–10.3)
Chloride: 93 mmol/L — ABNORMAL LOW (ref 98–111)
Creatinine, Ser: 0.74 mg/dL (ref 0.44–1.00)
GFR, Estimated: >60 mL/min (ref >60)
Glucose, Bld: 102 mg/dL — ABNORMAL HIGH (ref 70–99)
Potassium: 3.9 mmol/L (ref 3.5–5.1)
Sodium: 130 mmol/L — ABNORMAL LOW (ref 135–145)

## 2021-05-26 LAB — CBC
HCT: 37.9 % (ref 36.0–46.0)
Hemoglobin: 12.6 g/dL (ref 12.0–15.0)
MCH: 28.8 pg (ref 26.0–34.0)
MCHC: 33.2 g/dL (ref 30.0–36.0)
MCV: 86.5 fL (ref 80.0–100.0)
Platelets: 338 10*3/uL (ref 150–400)
RBC: 4.38 MIL/uL (ref 3.87–5.11)
RDW: 13.4 % (ref 11.5–15.5)
WBC: 7.8 10*3/uL (ref 4.0–10.5)
nRBC: 0 % (ref 0.0–0.2)

## 2021-05-26 LAB — RESP PANEL BY RT-PCR (FLU A&B, COVID) ARPGX2
Influenza A by PCR: NEGATIVE
Influenza B by PCR: NEGATIVE
SARS Coronavirus 2 by RT PCR: NEGATIVE

## 2021-05-26 NOTE — ED Triage Notes (Signed)
Pt c/o shob and runny nose x 2 days. Worsened today. Test covid/flu neg on Friday.  ?

## 2021-05-27 ENCOUNTER — Encounter (HOSPITAL_BASED_OUTPATIENT_CLINIC_OR_DEPARTMENT_OTHER): Payer: Self-pay | Admitting: Emergency Medicine

## 2021-05-27 DIAGNOSIS — R0602 Shortness of breath: Secondary | ICD-10-CM | POA: Diagnosis not present

## 2021-05-27 LAB — TROPONIN I (HIGH SENSITIVITY): Troponin I (High Sensitivity): 8 ng/L (ref <18)

## 2021-05-27 MED ORDER — SODIUM CHLORIDE 0.9 % IV BOLUS
500.0000 mL | Freq: Once | INTRAVENOUS | Status: AC
Start: 2021-05-27 — End: 2021-05-27
  Administered 2021-05-27: 500 mL via INTRAVENOUS

## 2021-05-27 NOTE — ED Provider Notes (Signed)
?[facility] ?Provider Note ? ? ?CSN: [id number] ?Arrival date & time: 05/26/21  2242 ? ?  ? ?History ? ?Chief Complaint  ?Patient presents with  ? Shortness of Breath  ? ? ?Rebecca Stanley is a 88 y.o. female. ? ?The history is provided by the patient and a relative.  ?Illness ?Location:  Body ?Quality:  Cough, congestion runny nose and fatigue x 6 days. ?Severity:  Mild ?Onset quality:  Gradual ?Duration:  6 days ?Timing:  Constant ?Progression:  Unchanged ?Chronicity:  New ?Context:  Has seen PMD and been given steroid shot amoxicillin and cough syrup and symptoms persist. ?Relieved by:  Nothing ?Worsened by:  Nothing ?Ineffective treatments:  Amoxicillin and steroids shot and cough syrup ?Associated symptoms: congestion, cough, fatigue and rhinorrhea   ?Associated symptoms: no chest pain, no diarrhea, no ear pain, no fever, no headaches, no loss of consciousness, no sore throat, no vomiting and no wheezing   ? ?  ? ?Home Medications ?Prior to Admission medications   ?Medication Sig Start Date End Date Taking? Authorizing Provider  ?amitriptyline (ELAVIL) 25 MG tablet Take 25 mg by mouth at bedtime.    [provider]  ?amitriptyline (ELAVIL) 50 MG tablet Take 50 mg by mouth at bedtime.  ?Patient not taking: Reported on 01/20/2021 12/25/18   [provider]  ?Cholecalciferol (VITAMIN D PO) Take 1 tablet by mouth daily.    [provider]  ?citalopram (CELEXA) 20 MG tablet Take 20 mg by mouth daily. ?Patient not taking: Reported on 01/20/2021    [provider]  ?DULoxetine (CYMBALTA) 20 MG capsule Take 40 mg by mouth 2 (two) times daily. 05/13/20   [provider]  ?famotidine (PEPCID) 20 MG tablet Take 1 tablet (20 mg total) by mouth 2 (two) times daily as needed for heartburn or indigestion. 06/12/19   End, Harrell Gave, MD  ?furosemide (LASIX) 20 MG tablet Take 1 tablet (20 mg total) by mouth as needed (weight gain or swelling). 01/20/21   End,  Harrell Gave, MD  ?levothyroxine (SYNTHROID, LEVOTHROID) 25 MCG tablet Take 62.5 mcg by mouth daily.    [provider]  ?mirabegron ER (MYRBETRIQ) 50 MG TB24 tablet Take by mouth. 08/13/20   [provider]  ?Multiple Vitamins-Minerals (MULTIVITAMIN WITH MINERALS) tablet Take 1 tablet by mouth daily.    [provider]  ?nitroGLYCERIN (NITROSTAT) 0.4 MG SL tablet Place 1 tablet (0.4 mg total) under the tongue every 5 (five) minutes as needed for chest pain. Maximum of 3 doses. 08/06/20   End, Harrell Gave, MD  ?OXcarbazepine (TRILEPTAL) 150 MG tablet oxcarbazepine 150 mg tablet ?Patient not taking: Reported on 01/20/2021 08/28/20   [provider]  ?perphenazine (TRILAFON) 4 MG tablet Take 1 mg by mouth at bedtime. 12/24/18   [provider]  ?potassium chloride (KLOR-CON 10) 10 MEQ tablet Take 10 mEq by mouth every other day.    [provider]  ?pregabalin (LYRICA) 50 MG capsule Take 25 mg by mouth daily.    [provider]  ?   ? ?Allergies    ?Losartan, Tramadol, and Lisinopril   ? ?Review of Systems   ?Review of Systems  ?Constitutional:  Positive for fatigue. Negative for fever.  ?HENT:  Positive for congestion and rhinorrhea. Negative for ear pain and sore throat.   ?Eyes:  Negative for visual disturbance.  ?Respiratory:  Positive for cough. Negative for wheezing.   ?Cardiovascular:  Negative for chest pain.  ?Gastrointestinal:  Negative for diarrhea  and vomiting.  ?Genitourinary:  Negative for difficulty urinating.  ?Musculoskeletal:  Negative for neck pain and neck stiffness.  ?Neurological:  Negative for loss of consciousness and headaches.  ?Psychiatric/Behavioral:  Negative for agitation.   ?All other systems reviewed and are negative. ? ?Physical Exam ?Updated Vital Signs ?BP (!) 160/69   Pulse (!) 57   Temp 98.9 ?F (37.2 ?C) (Oral)   Resp 17   SpO2 98%  ?Physical Exam ?Vitals and nursing note reviewed.  ?Constitutional:   ?   General: She is  not in acute distress. ?   Appearance: Normal appearance.  ?HENT:  ?   Head: Normocephalic and atraumatic.  ?   Nose: Rhinorrhea present.  ?Eyes:  ?   Conjunctiva/sclera: Conjunctivae normal.  ?   Pupils: Pupils are equal, round, and reactive to light.  ?Cardiovascular:  ?   Rate and Rhythm: Normal rate and regular rhythm.  ?   Pulses: Normal pulses.  ?   Heart sounds: Normal heart sounds.  ?Pulmonary:  ?   Effort: Pulmonary effort is normal. No respiratory distress.  ?   Breath sounds: Normal breath sounds. No wheezing or rales.  ?Abdominal:  ?   General: Bowel sounds are normal.  ?   Tenderness: There is no abdominal tenderness. There is no guarding.  ?Musculoskeletal:     ?   General: Normal range of motion.  ?   Cervical back: Normal range of motion and neck supple.  ?Skin: ?   General: Skin is warm and dry.  ?   Capillary Refill: Capillary refill takes less than 2 seconds.  ?Neurological:  ?   General: No focal deficit present.  ?   Mental Status: She is alert and oriented to person, place, and time.  ?   Deep Tendon Reflexes: Reflexes normal.  ?Psychiatric:     ?   Mood and Affect: Mood normal.     ?   Behavior: Behavior normal.  ? ? ?ED Results / Procedures / Treatments   ?Labs ?(all labs ordered are listed, but only abnormal results are displayed) ?Results for orders placed or performed during the hospital encounter of 05/26/21  ?Resp Panel by RT-PCR (Flu A&B, Covid) Nasopharyngeal Swab  ? Specimen: Nasopharyngeal Swab; Nasopharyngeal(NP) swabs in vial transport medium  ?Result Value Ref Range  ? SARS Coronavirus 2 by RT PCR NEGATIVE NEGATIVE  ? Influenza A by PCR NEGATIVE NEGATIVE  ? Influenza B by PCR NEGATIVE NEGATIVE  ?CBC  ?Result Value Ref Range  ? WBC 7.8 4.0 - 10.5 K/uL  ? RBC 4.38 3.87 - 5.11 MIL/uL  ? Hemoglobin 12.6 12.0 - 15.0 g/dL  ? HCT 37.9 36.0 - 46.0 %  ? MCV 86.5 80.0 - 100.0 fL  ? MCH 28.8 26.0 - 34.0 pg  ? MCHC 33.2 30.0 - 36.0 g/dL  ? RDW 13.4 11.5 - 15.5 %  ? Platelets 338 150 - 400  K/uL  ? nRBC 0.0 0.0 - 0.2 %  ?Basic metabolic panel  ?Result Value Ref Range  ? Sodium 130 (L) 135 - 145 mmol/L  ? Potassium 3.9 3.5 - 5.1 mmol/L  ? Chloride 93 (L) 98 - 111 mmol/L  ? CO2 27 22 - 32 mmol/L  ? Glucose, Bld 102 (H) 70 - 99 mg/dL  ? BUN 19 8 - 23 mg/dL  ? Creatinine, Ser 0.74 0.44 - 1.00 mg/dL  ? Calcium 9.4 8.9 - 10.3 mg/dL  ? GFR, Estimated >60 >60 mL/min  ? Anion gap 10  5 - 15  ?Troponin I (High Sensitivity)  ?Result Value Ref Range  ? Troponin I (High Sensitivity) 8 <18 ng/L  ? ?DG Chest 2 View ? ?Result Date: 05/26/2021 ?CLINICAL DATA:  Shortness of breath. EXAM: CHEST - 2 VIEW COMPARISON:  Chest radiograph dated 01/25/2021. FINDINGS: Minimal left lung base atelectasis. No focal consolidation, pleural effusion, pneumothorax. The cardiac silhouette is within normal limits. Atherosclerotic calcification of the aortic arch. No acute osseous pathology. Degenerative changes of the spine. Lower thoracic old compression fractures. IMPRESSION: No active cardiopulmonary disease. Electronically Signed   By: Anner Crete M.D.   On: 05/26/2021 23:36   ? ?EKG ?EKG Interpretation ? ?Date/Time:  Wednesday May 26 2021 22:51:48 EDT ?Ventricular Rate:  60 ?PR Interval:  152 ?QRS Duration: 80 ?QT Interval:  424 ?QTC Calculation: 424 ?R Axis:   32 ?Text Interpretation: Normal sinus rhythm Confirmed by Randal Buba, Thea Holshouser ([id number]) on 05/26/2021 11:01:27 PM ? ?Radiology ?DG Chest 2 View ? ?Result Date: 05/26/2021 ?CLINICAL DATA:  Shortness of breath. EXAM: CHEST - 2 VIEW COMPARISON:  Chest radiograph dated 01/25/2021. FINDINGS: Minimal left lung base atelectasis. No focal consolidation, pleural effusion, pneumothorax. The cardiac silhouette is within normal limits. Atherosclerotic calcification of the aortic arch. No acute osseous pathology. Degenerative changes of the spine. Lower thoracic old compression fractures. IMPRESSION: No active cardiopulmonary disease. Electronically Signed   By: Anner Crete M.D.   On:  05/26/2021 23:36   ? ?Procedures ?Procedures  ? ? ?Medications Ordered in ED ?Medications  ?sodium chloride 0.9 % bolus 500 mL (0 mLs Intravenous Stopped 05/27/21 0137)  ? ? ?ED Course/ Medical Decision Making/

## 2021-06-09 ENCOUNTER — Other Ambulatory Visit: Payer: Self-pay

## 2021-06-09 ENCOUNTER — Emergency Department (HOSPITAL_BASED_OUTPATIENT_CLINIC_OR_DEPARTMENT_OTHER)
Admission: EM | Admit: 2021-06-09 | Discharge: 2021-06-09 | Disposition: A | Discharge status: Home / Self Care | Payer: Medicare Other | Attending: Emergency Medicine | Admitting: Emergency Medicine

## 2021-06-09 ENCOUNTER — Emergency Department (HOSPITAL_BASED_OUTPATIENT_CLINIC_OR_DEPARTMENT_OTHER): Payer: Medicare Other

## 2021-06-09 ENCOUNTER — Encounter (HOSPITAL_BASED_OUTPATIENT_CLINIC_OR_DEPARTMENT_OTHER): Payer: Self-pay | Admitting: Emergency Medicine

## 2021-06-09 DIAGNOSIS — R053 Chronic cough: Secondary | ICD-10-CM | POA: Insufficient documentation

## 2021-06-09 DIAGNOSIS — R062 Wheezing: Secondary | ICD-10-CM | POA: Diagnosis not present

## 2021-06-09 DIAGNOSIS — J40 Bronchitis, not specified as acute or chronic: Secondary | ICD-10-CM

## 2021-06-09 DIAGNOSIS — Z79899 Other long term (current) drug therapy: Secondary | ICD-10-CM | POA: Diagnosis not present

## 2021-06-09 DIAGNOSIS — E871 Hypo-osmolality and hyponatremia: Secondary | ICD-10-CM

## 2021-06-09 DIAGNOSIS — I10 Essential (primary) hypertension: Secondary | ICD-10-CM | POA: Diagnosis not present

## 2021-06-09 DIAGNOSIS — R059 Cough, unspecified: Secondary | ICD-10-CM | POA: Insufficient documentation

## 2021-06-09 LAB — CBC WITH DIFFERENTIAL/PLATELET
Abs Immature Granulocytes: 0.06 10*3/uL (ref 0.00–0.07)
Basophils Absolute: 0 10*3/uL (ref 0.0–0.1)
Basophils Relative: 0 %
Eosinophils Absolute: 0 10*3/uL (ref 0.0–0.5)
Eosinophils Relative: 0 %
HCT: 37 % (ref 36.0–46.0)
Hemoglobin: 12.4 g/dL (ref 12.0–15.0)
Immature Granulocytes: 1 %
Lymphocytes Relative: 15 %
Lymphs Abs: 1.4 10*3/uL (ref 0.7–4.0)
MCH: 28.4 pg (ref 26.0–34.0)
MCHC: 33.5 g/dL (ref 30.0–36.0)
MCV: 84.7 fL (ref 80.0–100.0)
Monocytes Absolute: 0.9 10*3/uL (ref 0.1–1.0)
Monocytes Relative: 9 %
Neutro Abs: 7.3 10*3/uL (ref 1.7–7.7)
Neutrophils Relative %: 75 %
Platelets: 349 10*3/uL (ref 150–400)
RBC: 4.37 MIL/uL (ref 3.87–5.11)
RDW: 13.2 % (ref 11.5–15.5)
WBC: 9.7 10*3/uL (ref 4.0–10.5)
nRBC: 0 % (ref 0.0–0.2)

## 2021-06-09 LAB — BASIC METABOLIC PANEL
Anion gap: 8 (ref 5–15)
BUN: 18 mg/dL (ref 8–23)
CO2: 28 mmol/L (ref 22–32)
Calcium: 9.3 mg/dL (ref 8.9–10.3)
Chloride: 90 mmol/L — ABNORMAL LOW (ref 98–111)
Creatinine, Ser: 0.77 mg/dL (ref 0.44–1.00)
GFR, Estimated: >60 mL/min (ref >60)
Glucose, Bld: 114 mg/dL — ABNORMAL HIGH (ref 70–99)
Potassium: 3.3 mmol/L — ABNORMAL LOW (ref 3.5–5.1)
Sodium: 126 mmol/L — ABNORMAL LOW (ref 135–145)

## 2021-06-09 MED ORDER — ALBUTEROL SULFATE (2.5 MG/3ML) 0.083% IN NEBU
5.0000 mg | INHALATION_SOLUTION | Freq: Once | RESPIRATORY_TRACT | Status: AC
  Administered 2021-06-09: 5 mg via RESPIRATORY_TRACT
  Filled 2021-06-09: qty 6

## 2021-06-09 MED ORDER — HYDRALAZINE HCL 25 MG PO TABS
25.0000 mg | ORAL_TABLET | Freq: Once | ORAL | Status: AC
  Administered 2021-06-09: 25 mg via ORAL
  Filled 2021-06-09: qty 1

## 2021-06-09 MED ORDER — HYDRALAZINE HCL 25 MG PO TABS: 25.0000 mg | ORAL_TABLET | Freq: Three times a day (TID) | ORAL | 0 refills | Status: DC

## 2021-06-09 MED ORDER — SODIUM CHLORIDE 0.9 % IV BOLUS
500.0000 mL | Freq: Once | INTRAVENOUS | Status: AC
  Administered 2021-06-09: 500 mL via INTRAVENOUS

## 2021-06-09 MED ORDER — METHYLPREDNISOLONE 4 MG PO TBPK: ORAL_TABLET | ORAL | 0 refills | Status: DC

## 2021-06-09 MED ORDER — METHYLPREDNISOLONE SODIUM SUCC 125 MG IJ SOLR
125.0000 mg | Freq: Once | INTRAMUSCULAR | Status: AC
  Administered 2021-06-09: 125 mg via INTRAVENOUS
  Filled 2021-06-09: qty 2

## 2021-06-09 MED ORDER — IPRATROPIUM BROMIDE 0.02 % IN SOLN
0.5000 mg | Freq: Once | RESPIRATORY_TRACT | Status: AC
  Administered 2021-06-09: 0.5 mg via RESPIRATORY_TRACT
  Filled 2021-06-09: qty 2.5

## 2021-06-09 NOTE — Discharge Instructions (Addendum)
Increase hydralazine to 25 mg 3 times daily while you are on the steroids ? ?Use albuterol every 4-6 hours for cough ? ?Your chest x-ray today did not show any pneumonia ? ?Your sodium level was low so please drink plenty of Gatorade and eat more salt ? ?You need to recheck your kidney function and your sodium level in a week ? ?See your doctor for follow-up ? ?Return to ER if we have worse shortness of breath, cough, fever ?

## 2021-06-09 NOTE — ED Provider Notes (Signed)
?[facility] ?Provider Note ? ? ?CSN: [id number] ?Arrival date & time: 06/09/21  1701 ? ?  ? ?History ? ?Chief Complaint  ?Patient presents with  ? Cough  ? ? ?Rebecca Stanley is a 88 y.o. female history of hypertension, here presenting with cough.  Patient has been coughing for the last 2 weeks or so.  Patient was seen in the ED 2 weeks ago and was thought to have viral infection.  Patient was seen recently at urgent care was prescribed Omnicef.  Patient states that she has been wheezing still.  She also has persistent cough.  She states that she received a dose of steroids at urgent care but is not currently on steroids ? ?The history is provided by the patient.  ? ?  ? ?Home Medications ?Prior to Admission medications   ?Medication Sig Start Date End Date Taking? Authorizing Provider  ?amitriptyline (ELAVIL) 25 MG tablet Take 25 mg by mouth at bedtime.    [provider]  ?amitriptyline (ELAVIL) 50 MG tablet Take 50 mg by mouth at bedtime.  ?Patient not taking: Reported on 01/20/2021 12/25/18   [provider]  ?Cholecalciferol (VITAMIN D PO) Take 1 tablet by mouth daily.    [provider]  ?citalopram (CELEXA) 20 MG tablet Take 20 mg by mouth daily. ?Patient not taking: Reported on 01/20/2021    [provider]  ?DULoxetine (CYMBALTA) 20 MG capsule Take 40 mg by mouth 2 (two) times daily. 05/13/20   [provider]  ?famotidine (PEPCID) 20 MG tablet Take 1 tablet (20 mg total) by mouth 2 (two) times daily as needed for heartburn or indigestion. 06/12/19   End, Harrell Gave, MD  ?furosemide (LASIX) 20 MG tablet Take 1 tablet (20 mg total) by mouth as needed (weight gain or swelling). 01/20/21   End, Harrell Gave, MD  ?levothyroxine (SYNTHROID, LEVOTHROID) 25 MCG tablet Take 62.5 mcg by mouth daily.    [provider]  ?mirabegron ER (MYRBETRIQ) 50 MG TB24 tablet Take by mouth. 08/13/20   [provider]  ?Multiple Vitamins-Minerals  (MULTIVITAMIN WITH MINERALS) tablet Take 1 tablet by mouth daily.    [provider]  ?nitroGLYCERIN (NITROSTAT) 0.4 MG SL tablet Place 1 tablet (0.4 mg total) under the tongue every 5 (five) minutes as needed for chest pain. Maximum of 3 doses. 08/06/20   End, Harrell Gave, MD  ?OXcarbazepine (TRILEPTAL) 150 MG tablet oxcarbazepine 150 mg tablet ?Patient not taking: Reported on 01/20/2021 08/28/20   [provider]  ?perphenazine (TRILAFON) 4 MG tablet Take 1 mg by mouth at bedtime. 12/24/18   [provider]  ?potassium chloride (KLOR-CON 10) 10 MEQ tablet Take 10 mEq by mouth every other day.    [provider]  ?pregabalin (LYRICA) 50 MG capsule Take 25 mg by mouth daily.    [provider]  ?   ? ?Allergies    ?Losartan, Tramadol, and Lisinopril   ? ?Review of Systems   ?Review of Systems  ?Respiratory:  Positive for cough.   ?All other systems reviewed and are negative. ? ?Physical Exam ?Updated Vital Signs ?BP (!) 172/58   Pulse 63   Temp 98.4 ?F (36.9 ?C) (Oral)   Resp 20   Ht '5\' 1"'$  (1.549 m)   Wt 68 kg   SpO2 98%   BMI 28.34 kg/m?  ?Physical Exam ?Vitals and nursing note reviewed.  ?Constitutional:   ?   Comments: Coughing some audible wheeze  ?HENT:  ?  Head: Normocephalic.  ?   Nose: Nose normal.  ?   Mouth/Throat:  ?   Mouth: Mucous membranes are moist.  ?Eyes:  ?   Extraocular Movements: Extraocular movements intact.  ?   Pupils: Pupils are equal, round, and reactive to light.  ?Cardiovascular:  ?   Rate and Rhythm: Normal rate and regular rhythm.  ?   Pulses: Normal pulses.  ?Pulmonary:  ?   Comments: Mild wheezing and no retractions. ?Abdominal:  ?   General: Abdomen is flat.  ?   Palpations: Abdomen is soft.  ?Musculoskeletal:     ?   General: Normal range of motion.  ?   Cervical back: Normal range of motion and neck supple.  ?Skin: ?   General: Skin is warm.  ?   Capillary Refill: Capillary refill takes less than 2 seconds.  ?Neurological:  ?    General: No focal deficit present.  ?   Mental Status: She is alert and oriented to person, place, and time.  ?Psychiatric:     ?   Mood and Affect: Mood normal.     ?   Behavior: Behavior normal.  ? ? ?ED Results / Procedures / Treatments   ?Labs ?(all labs ordered are listed, but only abnormal results are displayed) ?Labs Reviewed  ?RESPIRATORY PANEL BY PCR  ?CBC WITH DIFFERENTIAL/PLATELET  ?BASIC METABOLIC PANEL  ? ? ?EKG ?None ? ?Radiology ?DG Chest 2 View ? ?Result Date: 06/09/2021 ?CLINICAL DATA:  Worsening cough EXAM: CHEST - 2 VIEW COMPARISON:  05/26/2021 FINDINGS: The heart size and mediastinal contours are within normal limits. Mild scarring of the bilateral lung bases, unchanged. Disc degenerative disease of the thoracic spine. IMPRESSION: No acute abnormality of the lungs. Mild scarring of the bilateral lung bases, unchanged. Electronically Signed   By: Delanna Ahmadi M.D.   On: 06/09/2021 17:40   ? ?Procedures ?Procedures  ? ? ?Medications Ordered in ED ?Medications  ?methylPREDNISolone sodium succinate (SOLU-MEDROL) 125 mg/2 mL injection 125 mg (has no administration in time range)  ?albuterol (PROVENTIL) (2.5 MG/3ML) 0.083% nebulizer solution 5 mg (has no administration in time range)  ?ipratropium (ATROVENT) nebulizer solution 0.5 mg (has no administration in time range)  ? ? ?ED Course/ Medical Decision Making/ A&P ?  ?                        ?Medical Decision Making ?Rebecca Stanley is a 88 y.o. female here presenting with cough and wheezing.  Patient tested multiple times negative for COVID.  I think likely bronchitis.  Will get labs and get chest x-ray.  Will give nebs and steroids and reassess ? ?8:36 PM ?White blood cell count is normal.  Sodium is 126 and patient was given some IV fluids since she is dehydrated.  Chest x-ray did not show pneumonia.  Patient is breathing much better after nebs and steroids.  Patient states that last time she got steroids her blood pressure went over 200.  She is on  hydrochlorothiazide.  She is also on hydralazine 10 mg 3 times daily.  Will increase hydralazine to 25 mg 3 times daily when she is on steroids.  Stable for discharge at this point.  Told her to use albuterol as needed in addition to the steroids. ? ?Problems Addressed: ?Bronchitis: acute illness or injury ?Hypertension, unspecified type: acute illness or injury ? ?Amount and/or Complexity of Data Reviewed ?Labs: ordered. Decision-making details documented in ED Course. ?Radiology: ordered and  independent interpretation performed. Decision-making details documented in ED Course. ?ECG/medicine tests: ordered and independent interpretation performed. ? ?Risk ?Prescription drug management. ? ? ?Final Clinical Impression(s) / ED Diagnoses ?Final diagnoses:  ?None  ? ? ?Rx / DC Orders ?ED Discharge Orders   ? ? None  ? ?  ? ? ?  ?Drenda Freeze, MD ?06/09/21 2038 ? ?

## 2021-06-09 NOTE — ED Triage Notes (Signed)
Pt has had a cough that is not getting better; was seen here recently for same, dx w/ viral illness; went to [facility] and was given abx, but she is not improving ?

## 2021-06-10 LAB — RESPIRATORY PANEL BY PCR

## 2021-07-13 ENCOUNTER — Encounter: Payer: Self-pay | Admitting: Pulmonary Disease

## 2021-07-13 ENCOUNTER — Ambulatory Visit (INDEPENDENT_AMBULATORY_CARE_PROVIDER_SITE_OTHER): Payer: Medicare Other | Admitting: Pulmonary Disease

## 2021-07-13 VITALS — BP 118/60 | HR 68 | Ht 61.0 in | Wt 155.8 lb

## 2021-07-13 DIAGNOSIS — J410 Simple chronic bronchitis: Secondary | ICD-10-CM

## 2021-07-13 MED ORDER — BREZTRI AEROSPHERE 160-9-4.8 MCG/ACT IN AERO: 2.0000 | INHALATION_SPRAY | Freq: Two times a day (BID) | RESPIRATORY_TRACT | 0 refills | Status: DC

## 2021-07-13 NOTE — Progress Notes (Signed)
? ? ?Subjective:  ? ?PATIENT ID: Rebecca Stanley GENDER: female DOB: 08/14/1930, MRN: [id number] ? ? ?HPI ? ?Chief Complaint  ?Patient presents with  ? Consult  ?  Recent bronchitis with wheezing and cough x 6 weeks  ? ? ?Reason for Visit: New consult for acute bronchitis ? ?Ms. Rebecca Stanley is a 88 year old female never smoker with mitral valve proplapse, OA, depression, HLD, HTN who presents as a new consult. Presents with son who provides significant history. ? ?Previously followed by Dr. Halford Chessman. Last seen in 02/2018 ?She has had bronchitis that began 5-6 weeks ago. Seen in urgent care she received antibiotic and inhaler. Did not improved and returned to urgent care for nebulizer and IM steroid and IM antibiotics. CXR neg for pneumonia. On 3/16 and 3/29 for similar symptoms. Steroids seemed to improve. She followed up her PCP. She began having cough, shortness of breath after completing steroids. Returned to urgent care for additional steroids. After completing this two weeks ago, she has had no further symptoms. ? ?She previously had COVID in 11/2020 that was severe. But improved after Paxlovid. She reports working 15 min daily. Awaiting evaluation for knee replacement. Also has chronic back pain and currently physical therapy. Reports significant fatigue since covid. ? ?Social History: ?Second hand smoke exposure ?Wood burning stove ? ?I have personally reviewed patient's past medical/family/social history, allergies, current medications. ? ?Past Medical History:  ?Diagnosis Date  ? (HFpEF) heart failure with preserved ejection fraction ([place])   ? a. 09/2020 Echo: EF 55-60%, no rwma, Gr1DD, nl RV size/fxn. RVSP 48.47mHg. Mild LAE. Mild MR.  ? Allergy   ? Arthritis   ? Blood transfusion   ? Cancer ([facility]   ? Colitis   ? Colon polyp   ? Depression   ? Diverticulosis   ? Heart murmur   ? History of cardiac catheterization   ? a. 05/2016 Cath ([place]): Nl cors. EF 65%. LVEDP 934mg.  ? History of stress test   ? a. 11/2018  MV: EF 55-65%, no ischemia/infarct.  ? Hyperlipidemia   ? Hypertension   ? Mitral valve prolapse   ? a. 09/2020 Echo: Mild MR.  ? Osteoporosis   ? Palpitations   ? a. 07/2020 reported tachycardia-->08/2020 [facility]: RSR 78 (49-143). Rare PACs/PVCs. 3 atrial runs up to 10 beats, max 162 bpm. Triggered events = sinus rhythm.  ? Thyroid disease   ?  ? ?Family History  ?Problem Relation Age of Onset  ? Heart disease Mother   ? Heart disease Other   ?     aunt  ? Diabetes Other   ?     grandmother  ? Breast cancer Other   ?     aunt  ? Ovarian cancer Other   ?     cousin  ? Rheum arthritis Father   ? Colon cancer Neg Hx   ?  ? ?Social History  ? ?Occupational History  ? Occupation: retired  ?Tobacco Use  ? Smoking status: Never  ? Smokeless tobacco: Never  ?Vaping Use  ? Vaping Use: Never used  ?Substance and Sexual Activity  ? Alcohol use: No  ? Drug use: No  ? Sexual activity: Not on file  ? ? ?Allergies  ?Allergen Reactions  ? Losartan Swelling  ?  Facial swelling  ? Tramadol   ?  Other reaction(s): Other (See Comments), Unknown ?Panic attacks with other meds that she is taking  ?  ? Lisinopril Cough  ?  ? ?  Outpatient Medications Prior to Visit  ?Medication Sig Dispense Refill  ? amitriptyline (ELAVIL) 25 MG tablet Take 25 mg by mouth at bedtime.    ? Cholecalciferol (VITAMIN D PO) Take 1 tablet by mouth daily.    ? citalopram (CELEXA) 20 MG tablet Take 20 mg by mouth daily.    ? DULoxetine (CYMBALTA) 20 MG capsule Take 40 mg by mouth 2 (two) times daily.    ? hydrALAZINE (APRESOLINE) 25 MG tablet Take 1 tablet (25 mg total) by mouth 3 (three) times daily. 15 tablet 0  ? levothyroxine (SYNTHROID, LEVOTHROID) 25 MCG tablet Take 62.5 mcg by mouth daily.    ? Multiple Vitamins-Minerals (MULTIVITAMIN WITH MINERALS) tablet Take 1 tablet by mouth daily.    ? nitroGLYCERIN (NITROSTAT) 0.4 MG SL tablet Place 1 tablet (0.4 mg total) under the tongue every 5 (five) minutes as needed for chest pain. Maximum of 3 doses. 25 tablet 2  ?  pregabalin (LYRICA) 50 MG capsule Take 25 mg by mouth daily. Pt taking 100 mg    ? amitriptyline (ELAVIL) 50 MG tablet Take 50 mg by mouth at bedtime.    ? famotidine (PEPCID) 20 MG tablet Take 1 tablet (20 mg total) by mouth 2 (two) times daily as needed for heartburn or indigestion. (Patient not taking: Reported on 07/13/2021)    ? furosemide (LASIX) 20 MG tablet Take 1 tablet (20 mg total) by mouth as needed (weight gain or swelling). (Patient not taking: Reported on 07/13/2021)    ? methylPREDNISolone (MEDROL DOSEPAK) 4 MG TBPK tablet Take as directed 21 each 0  ? mirabegron ER (MYRBETRIQ) 50 MG TB24 tablet Take by mouth. (Patient not taking: Reported on 07/13/2021)    ? OXcarbazepine (TRILEPTAL) 150 MG tablet oxcarbazepine 150 mg tablet (Patient not taking: Reported on 01/20/2021)    ? perphenazine (TRILAFON) 4 MG tablet Take 1 mg by mouth at bedtime.    ? potassium chloride (KLOR-CON) 10 MEQ tablet Take 10 mEq by mouth every other day. (Patient not taking: Reported on 07/13/2021)    ? ?No facility-administered medications prior to visit.  ? ? ?Review of Systems  ?Constitutional:  Negative for chills, diaphoresis, fever, malaise/fatigue and weight loss.  ?HENT:  Negative for congestion.   ?Respiratory:  Positive for cough, shortness of breath and wheezing. Negative for hemoptysis and sputum production.   ?Cardiovascular:  Negative for chest pain, palpitations and leg swelling.  ? ? ?Objective:  ? ?Vitals:  ? 07/13/21 1453  ?BP: 118/60  ?Pulse: 68  ?SpO2: 95%  ?Weight: 155 lb 12.8 oz (70.7 kg)  ?Height: '5\' 1"'$  (1.549 m)  ? ?SpO2: 95 % ?O2 Device: None (Room air) ? ?Physical Exam: ?General: Well-appearing, no acute distress ?HENT: [place], AT ?Eyes: EOMI, no scleral icterus ?Respiratory: Clear to auscultation bilaterally.  No crackles, wheezing or rales ?Cardiovascular: RRR, -M/R/G, no JVD ?Extremities:-Edema,-tenderness ?Neuro: AAO x4, CNII-XII grossly intact ?Psych: Normal mood, normal affect ? ?Data  Reviewed: ? ?Imaging: ?CXR 06/09/21 - Left basilar atelectasis ?CT Chest 04/20/18 - stable 23m RML nodule compared to 2018. Bibasilar atelectasis. DDD s/p kyphoplasty ? ?PFT: ?None on file ? ?Labs: ?CBC ?   ?Component Value Date/Time  ? WBC 9.7 06/09/2021 1955  ? RBC 4.37 06/09/2021 1955  ? HGB 12.4 06/09/2021 1955  ? HCT 37.0 06/09/2021 1955  ? PLT 349 06/09/2021 1955  ? MCV 84.7 06/09/2021 1955  ? MCH 28.4 06/09/2021 1955  ? MCHC 33.5 06/09/2021 1955  ? RDW 13.2 06/09/2021 1955  ?  LYMPHSABS 1.4 06/09/2021 1955  ? MONOABS 0.9 06/09/2021 1955  ? EOSABS 0.0 06/09/2021 1955  ? BASOSABS 0.0 06/09/2021 1955  ? ?   ?Assessment & Plan:  ? ?Discussion: ?88 year old female never smoker with mitral valve prolapse, OA, depression, HLD, HTN and prior COVID infection 11/2020 who presents as a new consult. Recent acute bronchitis with persistent coughing and wheezing for ~6 weeks improved on steroids. Has been of steroids for >2 weeks without recurrent symptoms. Son queries if she needs maintenance inhalers. CXR reviewed which is overall normal without acute issues. Given her resolved symptoms and normal chest imaging, her clinical presentation is reassuring for resolved acute bronchitis secondary to presume viral infection. We will evaluate with PFTs to rule underlying lung disease and if this is abnormal, can consider bronchodilators. If normal would treat symptoms as needed. I also provided Bretriz sample in case she has recurrent symptoms before our next visit. Provided inhaler teaching ? ?History of severe acute bronchitis ?--ORDER spirometry before and after ?--Will review results to determine if maintenance inhaler indicated ? ?Health Maintenance ?Immunization History  ?Administered Date(s) Administered  ? Influenza Split 12/31/2014, 01/10/2017  ? Influenza, High Dose Seasonal PF 12/26/2016, 12/27/2017, 12/23/2019, 01/29/2020, 01/07/2021  ? Influenza, Seasonal, Injecte, Preservative Fre 12/31/2014  ? Influenza,inj,Quad PF,6+  Mos 01/11/2016, 12/25/2018  ? Influenza,inj,Quad PF,6-35 Mos 01/11/2016  ? Influenza-Unspecified 12/31/2014, 12/23/2019  ? PFIZER Comirnaty(Gray Top)Covid-19 Tri-Sucrose Vaccine 03/25/2019, 04/15/2019, 11/07/2019, 11/28/2019, 1

## 2021-07-13 NOTE — Patient Instructions (Signed)
Acute bronchitis ?--ORDER spirometry before and after ?--Will review results to determine if maintenance inhaler indicated ? ?Follow-up with me after PFTs. OK to schedule on separate days ?

## 2021-08-30 ENCOUNTER — Encounter: Payer: Self-pay | Admitting: Medical

## 2021-08-30 ENCOUNTER — Ambulatory Visit (INDEPENDENT_AMBULATORY_CARE_PROVIDER_SITE_OTHER): Payer: Medicare Other | Admitting: Medical

## 2021-08-30 VITALS — BP 123/66 | HR 79 | Ht 60.0 in | Wt 156.4 lb

## 2021-08-30 DIAGNOSIS — I5032 Chronic diastolic (congestive) heart failure: Secondary | ICD-10-CM

## 2021-08-30 DIAGNOSIS — I10 Essential (primary) hypertension: Secondary | ICD-10-CM

## 2021-08-30 DIAGNOSIS — R600 Localized edema: Secondary | ICD-10-CM | POA: Diagnosis not present

## 2021-08-30 DIAGNOSIS — R5382 Chronic fatigue, unspecified: Secondary | ICD-10-CM

## 2021-08-30 NOTE — Patient Instructions (Signed)
Medication Instructions:   Your physician recommends that you continue on your current medications as directed. Please refer to the Current Medication list given to you today.  *If you need a refill on your cardiac medications before your next appointment, please call your pharmacy*   Lab Work:  None ordered  Testing/Procedures:  Your physician has requested that you have an echocardiogram. Echocardiography is a painless test that uses sound waves to create images of your heart. It provides your doctor with information about the size and shape of your heart and how well your heart's chambers and valves are working. This procedure takes approximately one hour. There are no restrictions for this procedure.   Follow-Up: At [facility], you and your health needs are our priority.  As part of our continuing mission to provide you with exceptional heart care, we have created designated Provider Care Teams.  These Care Teams include your primary Cardiologist (physician) and Advanced Practice Providers (APPs -  Physician Assistants and Nurse Practitioners) who all work together to provide you with the care you need, when you need it.  We recommend signing up for the patient portal called "MyChart".  Sign up information is provided on this After Visit Summary.  MyChart is used to connect with patients for Virtual Visits (Telemedicine).  Patients are able to view lab/test results, encounter notes, upcoming appointments, etc.  Non-urgent messages can be sent to your provider as well.   To learn more about what you can do with MyChart, go to [email or web address].    Your next appointment:   6 month(s)  The format for your next appointment:   In Person  Provider:   [name], MD{   Important Information About Sugar

## 2021-08-30 NOTE — Progress Notes (Unsigned)
Cardiology Office Note:    Date:  08/30/2021   ID:  Rebecca Stanley, DOB 1930/11/23, MRN [id number]  PCP:  Raina Mina., MD  [facility] Cardiologist:  [name], MD  [facility] Electrophysiologist:  None   Referring MD: Raina Mina., MD   Chief Complaint: 6 month follow-up  History of Present Illness:    Rebecca Stanley is a 88 y.o. female with a hx of HFpEF, mitral valve prolapse, HTN, HLD, chronic back pain who presents for follow-up.    She previously underwent diagnostic catheterization in March 2018 at [facility], showing normal coronaries with an EF of 65%.  She subsequently underwent stress testing in September 2020 showing EF 55 to 65% without ischemia or infarct.  She was recently evaluated by Dr. Saunders Revel in May of this year following hospitalization at [facility] for tachypalpitations and documented tachycardia.  Records have not been available for review.  Patient wore a Zio monitor in June of this year which did not show any significant arrhythmias.  Triggered events were associated with sinus rhythm.  Echocardiogram earlier this month showed an EF of 55 to 65%, grade 1 diastolic dysfunction, and an elevated RVSP at 48.2 mmHg.  In that setting, she was prescribed Lasix 20 mg daily as well as potassium chloride 10 mEq daily.  Last seen 01/2021 and reported fatigue, not felt to be cardiac in nature. LLE on exam on lasix.   Today, the patient feels weak and tired. She has felt this in the past. She feels this is worse. Denies chest pain or shortness of breath. She has lower leg edema, she feels it's worse. She had a steroid shot last week at [facility]. 2 weeks ago she had Botox injections at [facility]. She sleeps on the couch. She is off lasix due to low sodium. She is on HCTZ '25mg'$  daily.   Most recent sodium level was 134.  Past Medical History:  Diagnosis Date   (HFpEF) heart failure with preserved ejection fraction ([place])    a. 09/2020 Echo: EF 55-60%, no rwma,  Gr1DD, nl RV size/fxn. RVSP 48.7mHg. Mild LAE. Mild MR.   Allergy    Arthritis    Blood transfusion    Cancer ([facility]    Colitis    Colon polyp    Depression    Diverticulosis    Heart murmur    History of cardiac catheterization    a. 05/2016 Cath ([place]): Nl cors. EF 65%. LVEDP 960mg.   History of stress test    a. 11/2018 MV: EF 55-65%, no ischemia/infarct.   Hyperlipidemia    Hypertension    Mitral valve prolapse    a. 09/2020 Echo: Mild MR.   Osteoporosis    Palpitations    a. 07/2020 reported tachycardia-->08/2020 Zio: RSR 78 (49-143). Rare PACs/PVCs. 3 atrial runs up to 10 beats, max 162 bpm. Triggered events = sinus rhythm.   Thyroid disease     Past Surgical History:  Procedure Laterality Date   BELPHAROPTOSIS REPAIR     bladder tack     CATARACT EXTRACTION     COLONOSCOPY     DILATION AND CURETTAGE, DIAGNOSTIC / THERAPEUTIC     gluteous medius tendon rupture     done at [facility]   heel tendon repair     HIP ARTHROSCOPY W/ LABRAL REPAIR     kyploplasty     T12, L1, L2, L3, done at [place]  Current Medications: Current Meds  Medication Sig   amitriptyline (ELAVIL) 25 MG tablet Take 25 mg by mouth at bedtime.   Cholecalciferol (VITAMIN D PO) Take 1 tablet by mouth daily.   DULoxetine (CYMBALTA) 20 MG capsule Take 40 mg by mouth 2 (two) times daily.   famotidine (PEPCID) 20 MG tablet Take 1 tablet (20 mg total) by mouth 2 (two) times daily as needed for heartburn or indigestion.   hydrALAZINE (APRESOLINE) 25 MG tablet Take 1 tablet (25 mg total) by mouth 3 (three) times daily. (Patient taking differently: Take 10 mg by mouth 3 (three) times daily.)   hydrochlorothiazide (HYDRODIURIL) 25 MG tablet Take 1 tablet by mouth in the morning.   levothyroxine (SYNTHROID, LEVOTHROID) 25 MCG tablet Take 62.5 mcg by mouth daily.   pregabalin (LYRICA) 100 MG capsule Take 100 mg by mouth daily. At 4 pm     Allergies:   Losartan, Cholestyramine,  Cortisone, Prednisone, Tramadol, and Lisinopril   Social History   Socioeconomic History   Marital status: Widowed    Spouse name: Not on file   Number of children: 1   Years of education: Not on file   Highest education level: Not on file  Occupational History   Occupation: retired  Tobacco Use   Smoking status: Never   Smokeless tobacco: Never  Vaping Use   Vaping Use: Never used  Substance and Sexual Activity   Alcohol use: No   Drug use: No   Sexual activity: Not on file  Other Topics Concern   Not on file  Social History Narrative   Not on file   Social Determinants of Health   Financial Resource Strain: Not on file  Food Insecurity: Not on file  Transportation Needs: Not on file  Physical Activity: Not on file  Stress: Not on file  Social Connections: Not on file     Family History: The patient's family history includes Breast cancer in an other family member; Diabetes in an other family member; Heart disease in her mother and another family member; Ovarian cancer in an other family member; Rheum arthritis in her father. There is no history of Colon cancer.  ROS:   Please see the history of present illness.     All other systems reviewed and are negative.  EKGs/Labs/Other Studies Reviewed:    The following studies were reviewed today:  Echo 09/2020  1. Left ventricular ejection fraction, by estimation, is 55 to 60%. The  left ventricle has normal function. The left ventricle has no regional  wall motion abnormalities. Left ventricular diastolic parameters are  consistent with Grade I diastolic  dysfunction (impaired relaxation).   2. Right ventricular systolic function is normal. The right ventricular  size is normal. There is moderately elevated pulmonary artery systolic  pressure. The estimated right ventricular systolic pressure is 68.0 mmHg.   3. Left atrial size was mildly dilated.   4. The mitral valve is normal in structure. Mild mitral valve   regurgitation.   5. The inferior vena cava is dilated in size with >50% respiratory  variability, suggesting right atrial pressure of 8 mmHg.   Heart monitor 08/2020 The patient was monitored for 13 days, 2 hours. The predominant rhythm was sinus with an average rate of 78 bpm (range 49-143 bpm and sinus). There were rare PACs and PVCs. 3 atrial runs lasting up to 10 beats with a maximum rate of 162 bpm were observed. No sustained arrhythmia or prolonged pause occurred. Patient triggered events  correspond to sinus rhythm.   Predominantly sinus rhythm with rare PACs and PVCs as well as 3 brief episodes of PSVT.  No significant arrhythmia observed to explain patient's symptoms.  Myoview Lexiscan 07/2020 Narrative & Impression  There was no ST segment deviation noted during stress. No T wave inversion was noted during stress. The study is normal. This is a low risk study. The left ventricular ejection fraction is normal (55-65%).    EKG:  EKG is  ordered today.  The ekg ordered today demonstrates NSR 79bpm, LAD, nonspecific T wave changes  Recent Labs: 06/09/2021: BUN 18; Creatinine, Ser 0.77; Hemoglobin 12.4; Platelets 349; Potassium 3.3; Sodium 126  Recent Lipid Panel    Component Value Date/Time   CHOL 306 (H) 06/12/2019 1711   TRIG 206 (H) 06/12/2019 1711   HDL 63 06/12/2019 1711   CHOLHDL 4.9 06/12/2019 1711   VLDL 41 (H) 06/12/2019 1711   LDLCALC 202 (H) 06/12/2019 1711   LDLDIRECT 188.3 (H) 06/12/2019 1711    Physical Exam:    VS:  BP 123/66 (BP Location: Left Arm, Patient Position: Sitting, Cuff Size: Normal)   Pulse 79   Ht 5' (1.524 m)   Wt 156 lb 6.4 oz (70.9 kg)   SpO2 96%   BMI 30.54 kg/m     Wt Readings from Last 3 Encounters:  08/30/21 156 lb 6.4 oz (70.9 kg)  07/13/21 155 lb 12.8 oz (70.7 kg)  06/09/21 150 lb (68 kg)     GEN:  Well nourished, well developed in no acute distress HEENT: Normal NECK: No JVD; No carotid bruits LYMPHATICS: No  lymphadenopathy CARDIAC: RRR, no murmurs, rubs, gallops RESPIRATORY:  Clear to auscultation without rales, wheezing or rhonchi  ABDOMEN: Soft, non-tender, non-distended MUSCULOSKELETAL:  minimal edema; No deformity  SKIN: Warm and dry NEUROLOGIC:  Alert and oriented x 3 PSYCHIATRIC:  Normal affect   ASSESSMENT:    1. Leg edema   2. Essential hypertension   3. Chronic heart failure with preserved ejection fraction (HFpEF) ([place])   4. Chronic fatigue    PLAN:    In order of problems listed above:  Leg edema HFpEF Seems to be a chronic issue. This is complicated by hyponatremia. She used to be on lasix, but this was discontinued due to low sodium. She is on HCTZ '25mg'$  daily. She also has recent steroid shots/medications, which can cause fluid retention. I will recheck an echocardiogram.   Fatigue  She has chronic fatigue. Recent labs reviewed, unremarkable.   Syncope No further syncope reported.   Disposition: Follow up in 6 month(s) with MD/APP     Signed, Terryon Pineiro Ninfa Meeker, PA-C  08/30/2021 4:20 PM    [facility]

## 2021-09-18 ENCOUNTER — Other Ambulatory Visit: Payer: Self-pay

## 2021-09-18 ENCOUNTER — Emergency Department (HOSPITAL_BASED_OUTPATIENT_CLINIC_OR_DEPARTMENT_OTHER): Payer: Medicare Other

## 2021-09-18 ENCOUNTER — Encounter (HOSPITAL_BASED_OUTPATIENT_CLINIC_OR_DEPARTMENT_OTHER): Payer: Self-pay | Admitting: Emergency Medicine

## 2021-09-18 ENCOUNTER — Emergency Department (HOSPITAL_BASED_OUTPATIENT_CLINIC_OR_DEPARTMENT_OTHER)
Admission: EM | Admit: 2021-09-18 | Discharge: 2021-09-18 | Disposition: A | Discharge status: Home / Self Care | Payer: Medicare Other | Attending: Emergency Medicine | Admitting: Emergency Medicine

## 2021-09-18 DIAGNOSIS — M79642 Pain in left hand: Secondary | ICD-10-CM | POA: Diagnosis present

## 2021-09-18 DIAGNOSIS — M19042 Primary osteoarthritis, left hand: Secondary | ICD-10-CM | POA: Diagnosis not present

## 2021-09-18 MED ORDER — HYDROCODONE-ACETAMINOPHEN 5-325 MG PO TABS: 1.0000 | ORAL_TABLET | Freq: Four times a day (QID) | ORAL | 0 refills | Status: DC | PRN

## 2021-09-18 MED ORDER — HYDROCODONE-ACETAMINOPHEN 5-325 MG PO TABS
1.0000 | ORAL_TABLET | Freq: Once | ORAL | Status: AC
  Administered 2021-09-18: 1 via ORAL
  Filled 2021-09-18: qty 1

## 2021-09-18 NOTE — Discharge Instructions (Addendum)
Your hand pain is likely due to osteoarthritis.  You may alternate between indomethacin and Vicodin as needed for pain.  You may continue to wear your thumb brace for support.  Follow-up with your doctor for further care.  Please be aware that Vicodin can cause drowsiness.

## 2021-09-18 NOTE — ED Triage Notes (Signed)
Pt arrives pov, to triage in wheelchair, c/o left thumb and wrist pain that increases with ROM. Denies injury. Brace applied at home. Took meloxicam pta

## 2021-09-18 NOTE — ED Notes (Signed)
Patient states that she is having pain in her left thumb. Has full movement. Denies any injury. Some swelling. States that she has arthritis

## 2021-09-18 NOTE — ED Provider Notes (Signed)
[facility] EMERGENCY DEPARTMENT Provider Note   CSN: [id number] Arrival date & time: 09/18/21  1534     History  Chief Complaint  Patient presents with   Hand Pain    Rebecca Stanley is a 88 y.o. female.  The history is provided by the patient. No language interpreter was used.  Hand Pain    88 year old female presenting complaining of left hand pain.  History obtained through patient and through family member at bedside.  For the past 2 days patient has been having progressive worsening pain about her left nondominant hand.  Pain is primarily involving the base of her left thumb.  Pain is sharp, worse with palpation and with movement.  No report of any recent injury.  No fever no wrist pain no numbness.  She does use a cane and a walker to move about and states when she initiate movement it intensify her pain.  She is currently taking meloxicam at home without relief.  No recent trauma.  She also use a wrist brace for support without relief  Home Medications Prior to Admission medications   Medication Sig Start Date End Date Taking? Authorizing Provider  amitriptyline (ELAVIL) 25 MG tablet Take 25 mg by mouth at bedtime.    [provider]  amitriptyline (ELAVIL) 50 MG tablet Take 50 mg by mouth at bedtime. 12/25/18   [provider]  Budeson-Glycopyrrol-Formoterol (BREZTRI AEROSPHERE) 160-9-4.8 MCG/ACT AERO Inhale 2 puffs into the lungs in the morning and at bedtime. Patient not taking: Reported on 08/30/2021 07/13/21   Margaretha Seeds, MD  Cholecalciferol (VITAMIN D PO) Take 1 tablet by mouth daily.    [provider]  DULoxetine (CYMBALTA) 20 MG capsule Take 40 mg by mouth 2 (two) times daily. 05/13/20   [provider]  famotidine (PEPCID) 20 MG tablet Take 1 tablet (20 mg total) by mouth 2 (two) times daily as needed for heartburn or indigestion. 06/12/19   End, Harrell Gave, MD  furosemide (LASIX) 20 MG tablet Take 1 tablet (20 mg total)  by mouth as needed (weight gain or swelling). Patient not taking: Reported on 07/13/2021 01/20/21   End, Harrell Gave, MD  hydrALAZINE (APRESOLINE) 25 MG tablet Take 1 tablet (25 mg total) by mouth 3 (three) times daily. Patient taking differently: Take 10 mg by mouth 3 (three) times daily. 06/09/21   Drenda Freeze, MD  hydrochlorothiazide (HYDRODIURIL) 25 MG tablet Take 1 tablet by mouth in the morning. 01/28/21   [provider]  levothyroxine (SYNTHROID, LEVOTHROID) 25 MCG tablet Take 62.5 mcg by mouth daily.    [provider]  nitroGLYCERIN (NITROSTAT) 0.4 MG SL tablet Place 1 tablet (0.4 mg total) under the tongue every 5 (five) minutes as needed for chest pain. Maximum of 3 doses. Patient not taking: Reported on 08/30/2021 08/06/20   End, Harrell Gave, MD  OXcarbazepine (TRILEPTAL) 150 MG tablet oxcarbazepine 150 mg tablet Patient not taking: Reported on 01/20/2021 08/28/20   [provider]  pregabalin (LYRICA) 100 MG capsule Take 100 mg by mouth daily. At 4 pm    [provider]  pregabalin (LYRICA) 50 MG capsule Take 25 mg by mouth at bedtime.    [provider]      Allergies    Losartan, Cholestyramine, Cortisone, Prednisone, Tramadol, and Lisinopril    Review of Systems   Review of Systems  All other systems reviewed and are negative.   Physical Exam Updated Vital Signs BP 134/65 (BP Location: Right Arm)  Pulse 86   Temp 97.9 F (36.6 C)   Resp 18   Ht 5' (1.524 m)   Wt 68 kg   SpO2 95%   BMI 29.29 kg/m  Physical Exam Vitals and nursing note reviewed.  Constitutional:      General: She is not in acute distress.    Appearance: She is well-developed.  HENT:     Head: Atraumatic.  Eyes:     Conjunctiva/sclera: Conjunctivae normal.  Pulmonary:     Effort: Pulmonary effort is normal.  Musculoskeletal:        General: Tenderness (Left hand: Tenderness along the thenar eminence and the base of the thumb on palpation without  any obvious deformity no overlying skin changes no edema and no rash noted.  Radial pulse 2+.  Decreased thumb range of motion secondary to pain.) present.     Cervical back: Neck supple.  Skin:    Capillary Refill: Capillary refill takes less than 2 seconds.     Findings: No rash.  Neurological:     Mental Status: She is alert.  Psychiatric:        Mood and Affect: Mood normal.     ED Results / Procedures / Treatments   Labs (all labs ordered are listed, but only abnormal results are displayed) Labs Reviewed - No data to display  EKG None  Radiology DG Hand Complete Left  Result Date: 09/18/2021 CLINICAL DATA:  Trauma.  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 Fragmented appearance of the trapezium, possibly prior trapeziectomy. There is mild to moderate radiocarpal osteoarthritis. There is TFCC chondrocalcinosis. There is no evidence of acute fracture. There is moderate to severe interphalangeal joint osteoarthritis, worst at the DIP joints and middle finger PIP joint. Moderate third MCP joint osteoarthritis. There are no bony erosions. No periostitis. 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 Fragmented appearance of the trapezium, possibly prior trapeziectomy, correlate with surgical history. Mild to moderate radiocarpal osteoarthritis. TFCC chondrocalcinosis, can be seen in degenerative arthritis or CPPD arthropathy. Moderate-severe interphalangeal joint osteoarthritis, worst at the DIP joints and middle finger PIP joint. No evidence of acute fracture. Electronically Signed   By: Maurine Simmering M.D.   On: 09/18/2021 16:44    Procedures Procedures    Medications Ordered in ED Medications  HYDROcodone-acetaminophen (NORCO/VICODIN) 5-325 MG per tablet 1 tablet (1 tablet Oral Given 09/18/21 1701)    ED Course/ Medical Decision Making/  A&P                           Medical Decision Making Amount and/or Complexity of Data Reviewed Radiology: ordered.   BP 134/65 (BP Location: Right Arm)   Pulse 86   Temp 97.9 F (36.6 C)   Resp 18   Ht 5' (1.524 m)   Wt 68 kg   SpO2 95%   BMI 29.29 kg/m   4:56 PM This is an 88 year old female presenting with atraumatic left hand pain ongoing for the past 2 days.  Pain is localized more towards the base of her thumb at the thenar eminence.  Pain worsened with palpation.  On exam she does have reproducible tenderness along the affected area without any signs of infection.  She is neurovascular intact.  Radial pulse 2+, brisk cap refill to the pad of her finger.  An x-ray of the left hand obtained independently  viewed interpreted by me and agree with radiology interpretation.  X-ray of the obtained demonstrate moderate to severe interphalangeal joint osteoarthritis without evidence of fracture or dislocation.  I suspect patient's pain is likely due to arthritis and less likely to be infectious source.  Since patient report no improvement of symptoms with home medication, short course of opiate pain medication would be beneficial in this setting.  Patient brought with her a wrist brace which I suggest patient continue to wear for support.  WIl provide a short course of opiate medication for sxs control.  Pt made aware of potential drowsiness.          Final Clinical Impression(s) / ED Diagnoses Final diagnoses:  Arthritis of finger of left hand    Rx / DC Orders ED Discharge Orders          Ordered    HYDROcodone-acetaminophen (NORCO/VICODIN) 5-325 MG tablet  Every 6 hours PRN        09/18/21 1724              Domenic Moras, PA-C 09/18/21 1741    Drenda Freeze, MD 09/18/21 2245

## 2021-09-22 ENCOUNTER — Ambulatory Visit (INDEPENDENT_AMBULATORY_CARE_PROVIDER_SITE_OTHER): Payer: Medicare Other | Admitting: Pulmonary Disease

## 2021-09-22 ENCOUNTER — Encounter: Payer: Self-pay | Admitting: Pulmonary Disease

## 2021-09-22 VITALS — BP 130/64 | HR 78 | Temp 97.7°F | Ht 60.0 in | Wt 155.0 lb

## 2021-09-22 DIAGNOSIS — J410 Simple chronic bronchitis: Secondary | ICD-10-CM | POA: Diagnosis not present

## 2021-09-22 DIAGNOSIS — J42 Unspecified chronic bronchitis: Secondary | ICD-10-CM

## 2021-09-22 NOTE — Progress Notes (Signed)
Subjective:   PATIENT ID: Rebecca Stanley GENDER: female DOB: 1930/03/16, MRN: [id number]   HPI  Chief Complaint  Patient presents with   Follow-up    Patient and son would like to talk about covid vaccines and lasix. Son states that patient keeps stating that she feel her stomach is so full.     Reason for Visit: Follow-up  Ms. Rebecca Stanley is a 88 year old female never smoker with mitral valve proplapse, OA, depression, HLD, HTN who presents for follow-up. Son presents and provides majority of history.  Initial consult Previously followed by Dr. Halford Chessman. Last seen in 02/2018 She has had bronchitis that began 5-6 weeks ago. Seen in urgent care she received antibiotic and inhaler. Did not improved and returned to urgent care for nebulizer and IM steroid and IM antibiotics. CXR neg for pneumonia. On 3/16 and 3/29 for similar symptoms. Steroids seemed to improve. She followed up her PCP. She began having cough, shortness of breath after completing steroids. Returned to urgent care for additional steroids. After completing this two weeks ago, she has had no further symptoms.  She previously had COVID in 11/2020 that was severe. But improved after Paxlovid. She reports working 15 min daily. Awaiting evaluation for knee replacement. Also has chronic back pain and currently physical therapy. Reports significant fatigue since covid.  09/22/21 Since our last visit she reports she has been sleeping in upright/reclined position with improvement in breathing. Has not tried any inhalers. Currently not having cough or wheezing.  Social History: Second hand smoke exposure Wood burning stove  Past Medical History:  Diagnosis Date   (HFpEF) heart failure with preserved ejection fraction ([place])    a. 09/2020 Echo: EF 55-60%, no rwma, Gr1DD, nl RV size/fxn. RVSP 48.17mHg. Mild LAE. Mild MR.   Allergy    Arthritis    Blood transfusion    Cancer ([facility]    Colitis    Colon polyp    Depression     Diverticulosis    Heart murmur    History of cardiac catheterization    a. 05/2016 Cath ([place]): Nl cors. EF 65%. LVEDP 929mg.   History of stress test    a. 11/2018 MV: EF 55-65%, no ischemia/infarct.   Hyperlipidemia    Hypertension    Mitral valve prolapse    a. 09/2020 Echo: Mild MR.   Osteoporosis    Palpitations    a. 07/2020 reported tachycardia-->08/2020 [facility]: RSR 78 (49-143). Rare PACs/PVCs. 3 atrial runs up to 10 beats, max 162 bpm. Triggered events = sinus rhythm.   Thyroid disease      Family History  Problem Relation Age of Onset   Heart disease Mother    Heart disease Other        aunt   Diabetes Other        grandmother   Breast cancer Other        aunt   Ovarian cancer Other        cousin   Rheum arthritis Father    Colon cancer Neg Hx      Social History   Occupational History   Occupation: retired  Tobacco Use   Smoking status: Never   Smokeless tobacco: Never  Vaping Use   Vaping Use: Never used  Substance and Sexual Activity   Alcohol use: No   Drug use: No   Sexual activity: Not on file    Allergies  Allergen Reactions   Losartan Swelling  Facial swelling   Cholestyramine Other (See Comments)   Cortisone Other (See Comments)     high blood pressure    Prednisone Other (See Comments)   Tramadol     Other reaction(s): Other (See Comments), Unknown Panic attacks with other meds that she is taking     Lisinopril Cough     Outpatient Medications Prior to Visit  Medication Sig Dispense Refill   amitriptyline (ELAVIL) 25 MG tablet Take 25 mg by mouth at bedtime.     Cholecalciferol (VITAMIN D PO) Take 1 tablet by mouth daily.     DULoxetine (CYMBALTA) 20 MG capsule Take 40 mg by mouth 2 (two) times daily.     famotidine (PEPCID) 20 MG tablet Take 1 tablet (20 mg total) by mouth 2 (two) times daily as needed for heartburn or indigestion.     hydrALAZINE (APRESOLINE) 25 MG tablet Take 1 tablet (25 mg total) by mouth 3 (three) times  daily. (Patient taking differently: Take 10 mg by mouth 3 (three) times daily.) 15 tablet 0   hydrochlorothiazide (HYDRODIURIL) 25 MG tablet Take 1 tablet by mouth in the morning.     HYDROcodone-acetaminophen (NORCO/VICODIN) 5-325 MG tablet Take 1 tablet by mouth every 6 (six) hours as needed for moderate pain or severe pain. 10 tablet 0   levothyroxine (SYNTHROID, LEVOTHROID) 25 MCG tablet Take 62.5 mcg by mouth daily.     meloxicam (MOBIC) 7.5 MG tablet Take 7.5 mg by mouth daily.     nitroGLYCERIN (NITROSTAT) 0.4 MG SL tablet Place 1 tablet (0.4 mg total) under the tongue every 5 (five) minutes as needed for chest pain. Maximum of 3 doses. 25 tablet 2   pregabalin (LYRICA) 100 MG capsule Take 100 mg by mouth daily. At 4 pm     pregabalin (LYRICA) 50 MG capsule Take 25 mg by mouth at bedtime.     furosemide (LASIX) 20 MG tablet Take 1 tablet (20 mg total) by mouth as needed (weight gain or swelling). (Patient not taking: Reported on 09/22/2021)     amitriptyline (ELAVIL) 50 MG tablet Take 50 mg by mouth at bedtime.     Budeson-Glycopyrrol-Formoterol (BREZTRI AEROSPHERE) 160-9-4.8 MCG/ACT AERO Inhale 2 puffs into the lungs in the morning and at bedtime. 5.9 g 0   OXcarbazepine (TRILEPTAL) 150 MG tablet      No facility-administered medications prior to visit.    Review of Systems  Constitutional:  Negative for chills, diaphoresis, fever, malaise/fatigue and weight loss.  HENT:  Negative for congestion.   Respiratory:  Negative for cough, hemoptysis, sputum production, shortness of breath and wheezing.   Cardiovascular:  Negative for chest pain, palpitations and leg swelling.     Objective:   Vitals:   09/22/21 1623  BP: 130/64  Pulse: 78  Temp: 97.7 F (36.5 C)  TempSrc: Oral  SpO2: 92%  Weight: 155 lb (70.3 kg)  Height: 5' (1.524 m)   SpO2: 92 % O2 Device: None (Room air)  Physical Exam: General: Elderly, well-appearing, no acute distress HENT: , AT Eyes: EOMI, no  scleral icterus Respiratory: Clear to auscultation bilaterally.  No crackles, wheezing or rales Cardiovascular: RRR, -M/R/G, no JVD Extremities:-Edema,-tenderness Neuro: AAO x4, CNII-XII grossly intact Psych: Normal mood, normal affect   Data Reviewed:  Imaging: CXR 06/09/21 - Left basilar atelectasis CT Chest 04/20/18 - stable 32m RML nodule compared to 2018. Bibasilar atelectasis. DDD s/p kyphoplasty  PFT: 09/22/21 FVC 1.15 (63%) FEV1 0.93 (70%) Ratio 85. No significant bronchodilator response however  does not preclude benefit of therapy. Interpretation: No obstructive defect. Reduced FVC and FEV1 suggestive of poor effort vs restrictive defect.  Labs: CBC    Component Value Date/Time   WBC 9.7 06/09/2021 1955   RBC 4.37 06/09/2021 1955   HGB 12.4 06/09/2021 1955   HCT 37.0 06/09/2021 1955   PLT 349 06/09/2021 1955   MCV 84.7 06/09/2021 1955   MCH 28.4 06/09/2021 1955   MCHC 33.5 06/09/2021 1955   RDW 13.2 06/09/2021 1955   LYMPHSABS 1.4 06/09/2021 1955   MONOABS 0.9 06/09/2021 1955   EOSABS 0.0 06/09/2021 1955   BASOSABS 0.0 06/09/2021 1955      Assessment & Plan:   Discussion: 88 year old never smoker with mitral valve prolapse, OA, depression, HLD, HTN and prior COVID infection 11/2020 who presents for follow-up. Acute symptoms resolved. PFTs demonstrate reduced FVC, likely mild. No obstructive defect present. We discussed utility of bronchodilators during acute illness but no indication for maintenance inhalers.  History of severe acute bronchitis --No indication for maintenance inhalers --OK to use Breztri samples if she becomes symptomatic with cough, wheezing and shortness of breath  Health Maintenance Immunization History  Administered Date(s) Administered   Influenza Split 12/31/2014, 01/10/2017   Influenza, High Dose Seasonal PF 12/26/2016, 12/27/2017, 12/23/2019, 01/29/2020, 01/07/2021   Influenza, Seasonal, Injecte, Preservative Fre 12/31/2014    Influenza,inj,Quad PF,6+ Mos 01/11/2016, 12/25/2018   Influenza,inj,Quad PF,6-35 Mos 01/11/2016   Influenza-Unspecified 12/31/2014, 12/23/2019   PFIZER Comirnaty(Gray Top)Covid-19 Tri-Sucrose Vaccine 03/25/2019, 04/15/2019, 11/07/2019, 11/28/2019, 01/29/2020, 06/11/2020   PFIZER(Purple Top)SARS-COV-2 Vaccination 11/07/2019, 01/29/2020   Pneumococcal Conjugate-13 07/10/2013   Pneumococcal Polysaccharide-23 03/14/2006, 01/29/2020   Unspecified SARS-COV-2 Vaccination 01/29/2020   Zoster Recombinat (Shingrix) 08/25/2017, 10/11/2017, 01/30/2018   Zoster, Live 03/14/2009    No orders of the defined types were placed in this encounter.  No orders of the defined types were placed in this encounter.   Return if symptoms worsen or fail to improve.   I have spent a total time of 35-minutes on the day of the appointment including chart review, data review, collecting history, coordinating care and discussing medical diagnosis and plan with the patient/family. Past medical history, allergies, medications were reviewed. Pertinent imaging, labs and tests included in this note have been reviewed and interpreted independently by me.  [place], MD [facility] 09/22/2021 4:30 PM  Office Number [phone]

## 2021-09-22 NOTE — Progress Notes (Signed)
Full PFT performed today. °

## 2021-09-22 NOTE — Patient Instructions (Signed)
History of severe acute bronchitis --No indication for maintenance inhalers --OK to use Breztri samples if she becomes symptomatic with cough, wheezing and shortness of breath --Recommend annual influenza and COVID vaccination  Follow-up with me as needed

## 2021-09-22 NOTE — Patient Instructions (Signed)
Full PFT performed today. °

## 2021-09-23 LAB — PULMONARY FUNCTION TEST
FEF 25-75 Pre: 1.42 L/sec
FEF2575-%Pred-Pre: 179 %
FEV1-%Change-Post: -24 %
FEV1-%Pred-Post: 70 %
FEV1-%Pred-Pre: 92 %
FEV1-Post: 0.93 L
FEV1-Pre: 1.23 L
FEV1FVC-%Change-Post: -5 %
FEV1FVC-%Pred-Pre: 118 %
FEV6-%Change-Post: -25 %
FEV6-%Pred-Post: 63 %
FEV6-%Pred-Pre: 85 %
FEV6-Post: 1.08 L
FEV6-Pre: 1.44 L
FEV6FVC-%Pred-Post: 107 %
FEV6FVC-%Pred-Pre: 107 %
FVC-%Change-Post: -20 %
FVC-%Pred-Post: 63 %
FVC-%Pred-Pre: 78 %
FVC-Post: 1.15 L
FVC-Pre: 1.44 L
Post FEV1/FVC ratio: 81 %
Post FEV6/FVC ratio: 100 %
Pre FEV1/FVC ratio: 85 %
Pre FEV6/FVC Ratio: 100 %

## 2021-09-26 ENCOUNTER — Encounter: Payer: Self-pay | Admitting: Pulmonary Disease

## 2021-10-05 ENCOUNTER — Ambulatory Visit (INDEPENDENT_AMBULATORY_CARE_PROVIDER_SITE_OTHER): Payer: Medicare Other

## 2021-10-05 DIAGNOSIS — R600 Localized edema: Secondary | ICD-10-CM

## 2021-10-05 LAB — ECHOCARDIOGRAM COMPLETE
AR max vel: 1.81 cm2
AV Area VTI: 2.09 cm2
AV Area mean vel: 2.05 cm2
AV Mean grad: 4 mmHg
AV Peak grad: 8.1 mmHg
Ao pk vel: 1.42 m/s
Area-P 1/2: 3.03 cm2
Calc EF: 57.1 %
S' Lateral: 2.4 cm
Single Plane A2C EF: 54.1 %
Single Plane A4C EF: 62.2 %

## 2022-03-02 ENCOUNTER — Ambulatory Visit: Payer: Medicare Other | Admitting: Internal Medicine

## 2022-04-27 ENCOUNTER — Ambulatory Visit: Payer: Medicare Other | Attending: Internal Medicine | Admitting: Internal Medicine

## 2022-04-27 ENCOUNTER — Encounter: Payer: Self-pay | Admitting: Internal Medicine

## 2022-04-27 VITALS — BP 130/68 | HR 86 | Ht 61.0 in | Wt 151.0 lb

## 2022-04-27 DIAGNOSIS — R5382 Chronic fatigue, unspecified: Secondary | ICD-10-CM | POA: Diagnosis not present

## 2022-04-27 DIAGNOSIS — I10 Essential (primary) hypertension: Secondary | ICD-10-CM | POA: Insufficient documentation

## 2022-04-27 DIAGNOSIS — R600 Localized edema: Secondary | ICD-10-CM | POA: Diagnosis not present

## 2022-04-27 NOTE — Patient Instructions (Signed)
Medication Instructions:  Your Physician recommend you continue on your current medication as directed.    Please monitor your blood pressure daily. If systolic (top number) is greater than 160, you may take an additional 10 mg hydralazine.   *If you need a refill on your cardiac medications before your next appointment, please call your pharmacy*   Lab Work: None ordered today   Testing/Procedures: None ordered today   Follow-Up: At [facility], you and your health needs are our priority.  As part of our continuing mission to provide you with exceptional heart care, we have created designated Provider Care Teams.  These Care Teams include your primary Cardiologist (physician) and Advanced Practice Providers (APPs -  Physician Assistants and Nurse Practitioners) who all work together to provide you with the care you need, when you need it.  We recommend signing up for the patient portal called "MyChart".  Sign up information is provided on this After Visit Summary.  MyChart is used to connect with patients for Virtual Visits (Telemedicine).  Patients are able to view lab/test results, encounter notes, upcoming appointments, etc.  Non-urgent messages can be sent to your provider as well.   To learn more about what you can do with MyChart, go to [email or web address].    Your next appointment:   6 month(s)  Provider:   You may see [name], MD or one of the following Advanced Practice Providers on your designated Care Team:   Murray Hodgkins, NP Christell Faith, PA-C Cadence Kathlen Mody, PA-C Gerrie Nordmann, NP

## 2022-04-27 NOTE — Progress Notes (Unsigned)
Follow-up Outpatient Visit Date: 04/27/2022  Primary Care Provider: Raina Mina., MD [place] [id number]  Chief Complaint: Fatigue  HPI:  Rebecca Stanley is a 89 y.o. female with history of chronic HFpEF, mitral valve prolapse, hypertension, hyperlipidemia, MGUS under surveillance at [facility], anxiety/depression, and chronic back pain, who presents for follow-up of fatigue.  She was last seen in our office in 08/2021 by Cadence Furth, PA, at which time she again complained of fatigue and leg swelling.  She denied chest pain or shortness of breath, though further questioning revealed some chronic orthopnea.  It was thought that recent steroid injections may have contributed to leg swelling.  She had previously been on furosemide, though this was discontinued due to hyponatremia (she remained on HCTZ at her last visit with most recent sodium being near normal at 134).  Subsequent echocardiogram showed normal LVEF with grade 1 diastolic dysfunction.  Mild pulmonary hypertension was noted.  No significant valvular abnormalities were seen.  Today, Rebecca Stanley returns with her son.  She continues to complain of longstanding fatigue but is working with physical therapy again to help improve her strength and stamina.  Her mobility is somewhat limited by chronic right knee pain.  She has been told that she may need a right knee replacement, though she and her son are reluctant to proceed given her advanced age.  She is also having pain in her left thumb and was told that she may need surgery there as well.  She has not had any chest pain, palpitations, or lightheadedness.  She has experienced a few transient episodes of shortness of breath that seem to be associated with panic attacks.  She continues to follow with geriatrics closely at [facility] for ongoing titration of her psychiatric medications.  She has been mild intermittent dependent edema, that has been stable.  She was recently seen in the ED at  [facility] due to abdominal pain in the setting of constipation.  Her son believes the constipation may have been precipitated by back pain and nausea for which she needed to take acetaminophen, NSAIDs, and antiemetics.  She is now using MiraLAX daily with normalization of her bowels.  At the time of the ED visit, her blood pressure was severely elevated.  It has been trending down at home, though systolic readings are still often between 140 and 160 mmHg (occasionally even higher).  She remains on hydralazine 10 mg 3 times daily and HCTZ 25 mg daily for management of her BP.  --------------------------------------------------------------------------------------------------  Cardiovascular History & Procedures: Cardiovascular Problems: Chest pain Mitral valve prolapse Chronic fatigue   Risk Factors: Hypertension, hyperlipidemia, age > 77   Cath/PCI: LHC (06/06/16, [facility]): Normal coronary arteries.  LVEF 65%.  LVEDP 9 mmHg.   CV Surgery: None   EP Procedures and Devices: 14-day event monitor (08/25/2020): Predominantly sinus rhythm with rare PACs and PVCs as well as 3 brief episodes of PSVT.  No significant arrhythmia observed to explain patient's symptoms.   Non-Invasive Evaluation(s): TTE (10/05/2021): Normal LV size and wall thickness.  LVEF 60-65% with grade 1 diastolic dysfunction.  Normal RV size and function.  Mild pulmonary hypertension with RVSP 41 mmHg.  Normal biatrial size.  No pericardial effusion.  Mild mitral regurgitation.  Mild tricuspid regurgitation.  Normal tricuspid aortic valve.  Normal CVP. TTE (09/18/2020): Normal LV size and wall thickness.  LVEF 55-60% with grade 1 diastolic dysfunction.  Normal RV size and function.  Moderate pulmonary hypertension.  Mild left atrial enlargement.  Mild tricuspid regurgitation.  Mild aortic sclerosis without stenosis.  CVP 8 mmHg. Pharmacologic MPI (12/10/2018): Low risk study without ischemia or scar.  LVEF  55-65%. TTE (08/19/10): Normal LV size and systolic function.  Impaired relaxation noted.  Normal RV size and function.  Mild TR.  Recent CV Pertinent Labs: Lab Results  Component Value Date   CHOL 306 (H) 06/12/2019   HDL 63 06/12/2019   LDLCALC 202 (H) 06/12/2019   LDLDIRECT 188.3 (H) 06/12/2019   TRIG 206 (H) 06/12/2019   CHOLHDL 4.9 06/12/2019   K 3.3 (L) 06/09/2021   BUN 18 06/09/2021   CREATININE 0.77 06/09/2021    Past medical and surgical history were reviewed and updated in EPIC.  Current Meds  Medication Sig   amitriptyline (ELAVIL) 25 MG tablet Take 25 mg by mouth at bedtime.   Cholecalciferol (VITAMIN D PO) Take 1 tablet by mouth daily.   clonazePAM (KLONOPIN) 0.5 MG tablet Take 0.5 mg by mouth 2 (two) times daily as needed for anxiety.   DULoxetine (CYMBALTA) 20 MG capsule Take 40 mg by mouth 2 (two) times daily.   famotidine (PEPCID) 20 MG tablet Take 1 tablet (20 mg total) by mouth 2 (two) times daily as needed for heartburn or indigestion.   furosemide (LASIX) 20 MG tablet Take 1 tablet (20 mg total) by mouth as needed (weight gain or swelling).   GEMTESA 75 MG TABS Take 1 tablet by mouth daily.   hydrALAZINE (APRESOLINE) 10 MG tablet Take 10 mg by mouth 3 (three) times daily.   hydrochlorothiazide (HYDRODIURIL) 25 MG tablet Take 1 tablet by mouth in the morning.   levothyroxine (SYNTHROID, LEVOTHROID) 25 MCG tablet Take 62.5 mcg by mouth daily.   nitroGLYCERIN (NITROSTAT) 0.4 MG SL tablet Place 1 tablet (0.4 mg total) under the tongue every 5 (five) minutes as needed for chest pain. Maximum of 3 doses.   pregabalin (LYRICA) 100 MG capsule Take 100 mg by mouth daily. At 4 pm   pregabalin (LYRICA) 50 MG capsule Take 25 mg by mouth at bedtime.    Allergies: Losartan, Amlodipine, Cholestyramine, Cortisone, Prednisone, Tramadol, and Lisinopril  Social History   Tobacco Use   Smoking status: Never   Smokeless tobacco: Never  Vaping Use   Vaping Use: Never used   Substance Use Topics   Alcohol use: No   Drug use: No    Family History  Problem Relation Age of Onset   Heart disease Mother    Heart disease Other        aunt   Diabetes Other        grandmother   Breast cancer Other        aunt   Ovarian cancer Other        cousin   Rheum arthritis Father    Colon cancer Neg Hx     Review of Systems: A 12-system review of systems was performed and was negative except as noted in the HPI.  --------------------------------------------------------------------------------------------------  Physical Exam: BP 130/68 (BP Location: Right Arm, Patient Position: Sitting, Cuff Size: Normal)   Pulse 86   Ht 5' 1"$  (1.549 m)   Wt 151 lb (68.5 kg)   SpO2 96%   BMI 28.53 kg/m   General:  NAD.  Seated in a wheelchair and accompanied by her son. Neck: No JVD or HJR. Lungs: Clear to auscultation bilaterally without wheezes or crackles. Heart: Regular rate and rhythm without murmurs, rubs, or gallops. Abdomen: Soft,  nontender, nondistended. Extremities: Trace pretibial edema bilaterally with varicose veins noted.  EKG: Normal sinus rhythm with possible left atrial enlargement and poor R wave progression.  No significant change from prior tracing on 08/30/2021.  Lab Results  Component Value Date   WBC 9.7 06/09/2021   HGB 12.4 06/09/2021   HCT 37.0 06/09/2021   MCV 84.7 06/09/2021   PLT 349 06/09/2021    Lab Results  Component Value Date   NA 126 (L) 06/09/2021   K 3.3 (L) 06/09/2021   CL 90 (L) 06/09/2021   CO2 28 06/09/2021   BUN 18 06/09/2021   CREATININE 0.77 06/09/2021   GLUCOSE 114 (H) 06/09/2021   ALT 23 11/26/2018    Lab Results  Component Value Date   CHOL 306 (H) 06/12/2019   HDL 63 06/12/2019   LDLCALC 202 (H) 06/12/2019   LDLDIRECT 188.3 (H) 06/12/2019   TRIG 206 (H) 06/12/2019   CHOLHDL 4.9 06/12/2019     --------------------------------------------------------------------------------------------------  ASSESSMENT AND PLAN: Chronic fatigue: Overall, this has been stable and present for many years.  Given extensive testing by multiple providers and stable symptoms, we will defer additional testing/workup at this time.  Leg edema: Most likely due to venous insufficiency.  Will continue current regimen of HCTZ for blood pressure control and edema management.  Defer standing furosemide given prior hyponatremia that may have been exacerbated by this.  Leg elevation encouraged.  Hypertension: Blood pressures have been somewhat labile over the last few weeks and seem to have spiked when she was seen in the ED for constipation.  Blood pressure today is upper normal.  We will continue her current regimen of hydralazine and hydrochlorothiazide.  If systolic blood pressure is above 160 mmHg, I have instructed Rebecca Stanley and her son to take an additional dose of hydralazine 10 mg.  So we will need to be monitored closely given history of hyponatremia; it was normal on the last check on 04/14/2022.  Follow-up: Return to clinic in 6 months.  [name], MD 04/27/2022 4:36 PM

## 2022-04-28 ENCOUNTER — Encounter: Payer: Self-pay | Admitting: Internal Medicine

## 2022-04-28 IMAGING — DX DG CHEST 2V
2 series · 2 of 2 positions shown · non-contrast
Comparison: 05/26/2021

CLINICAL DATA: Worsening cough

EXAM:
CHEST - 2 VIEW

[chest pa]
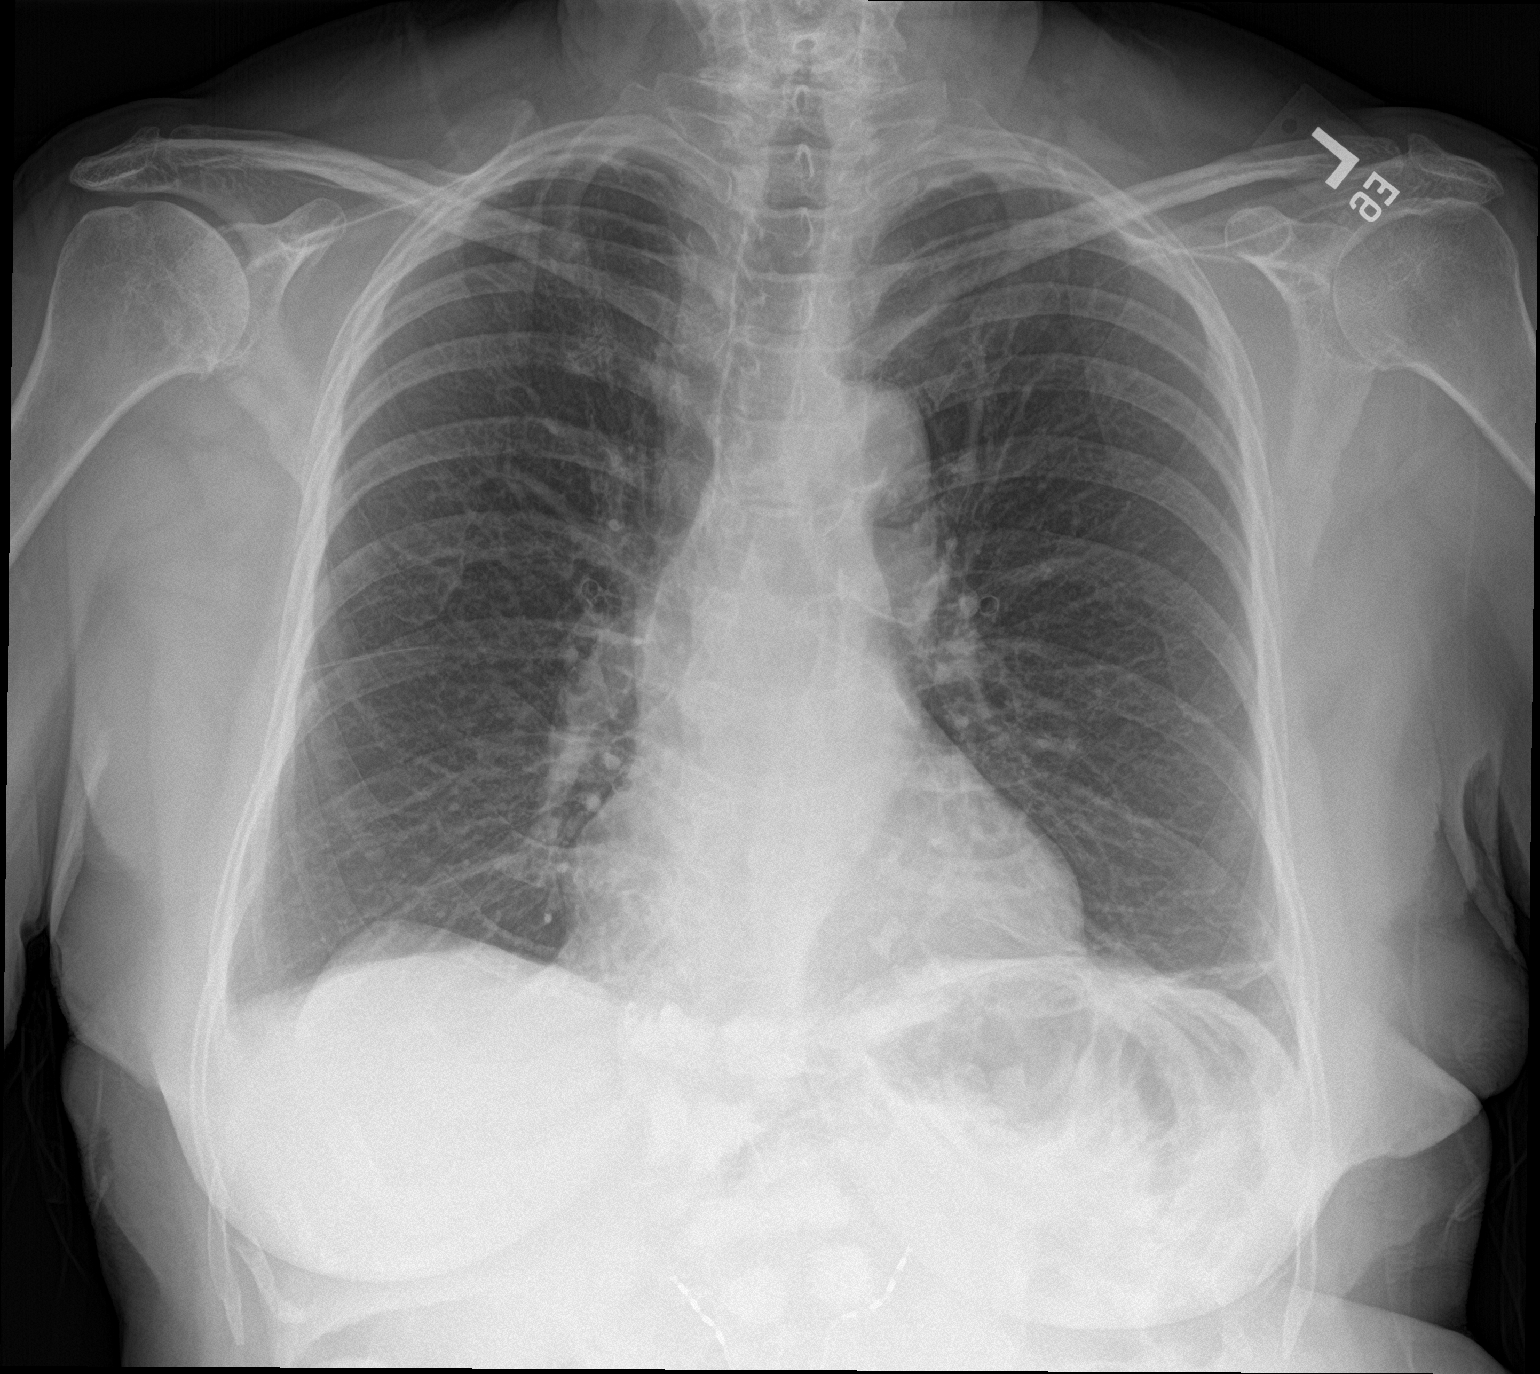

[chest lat]
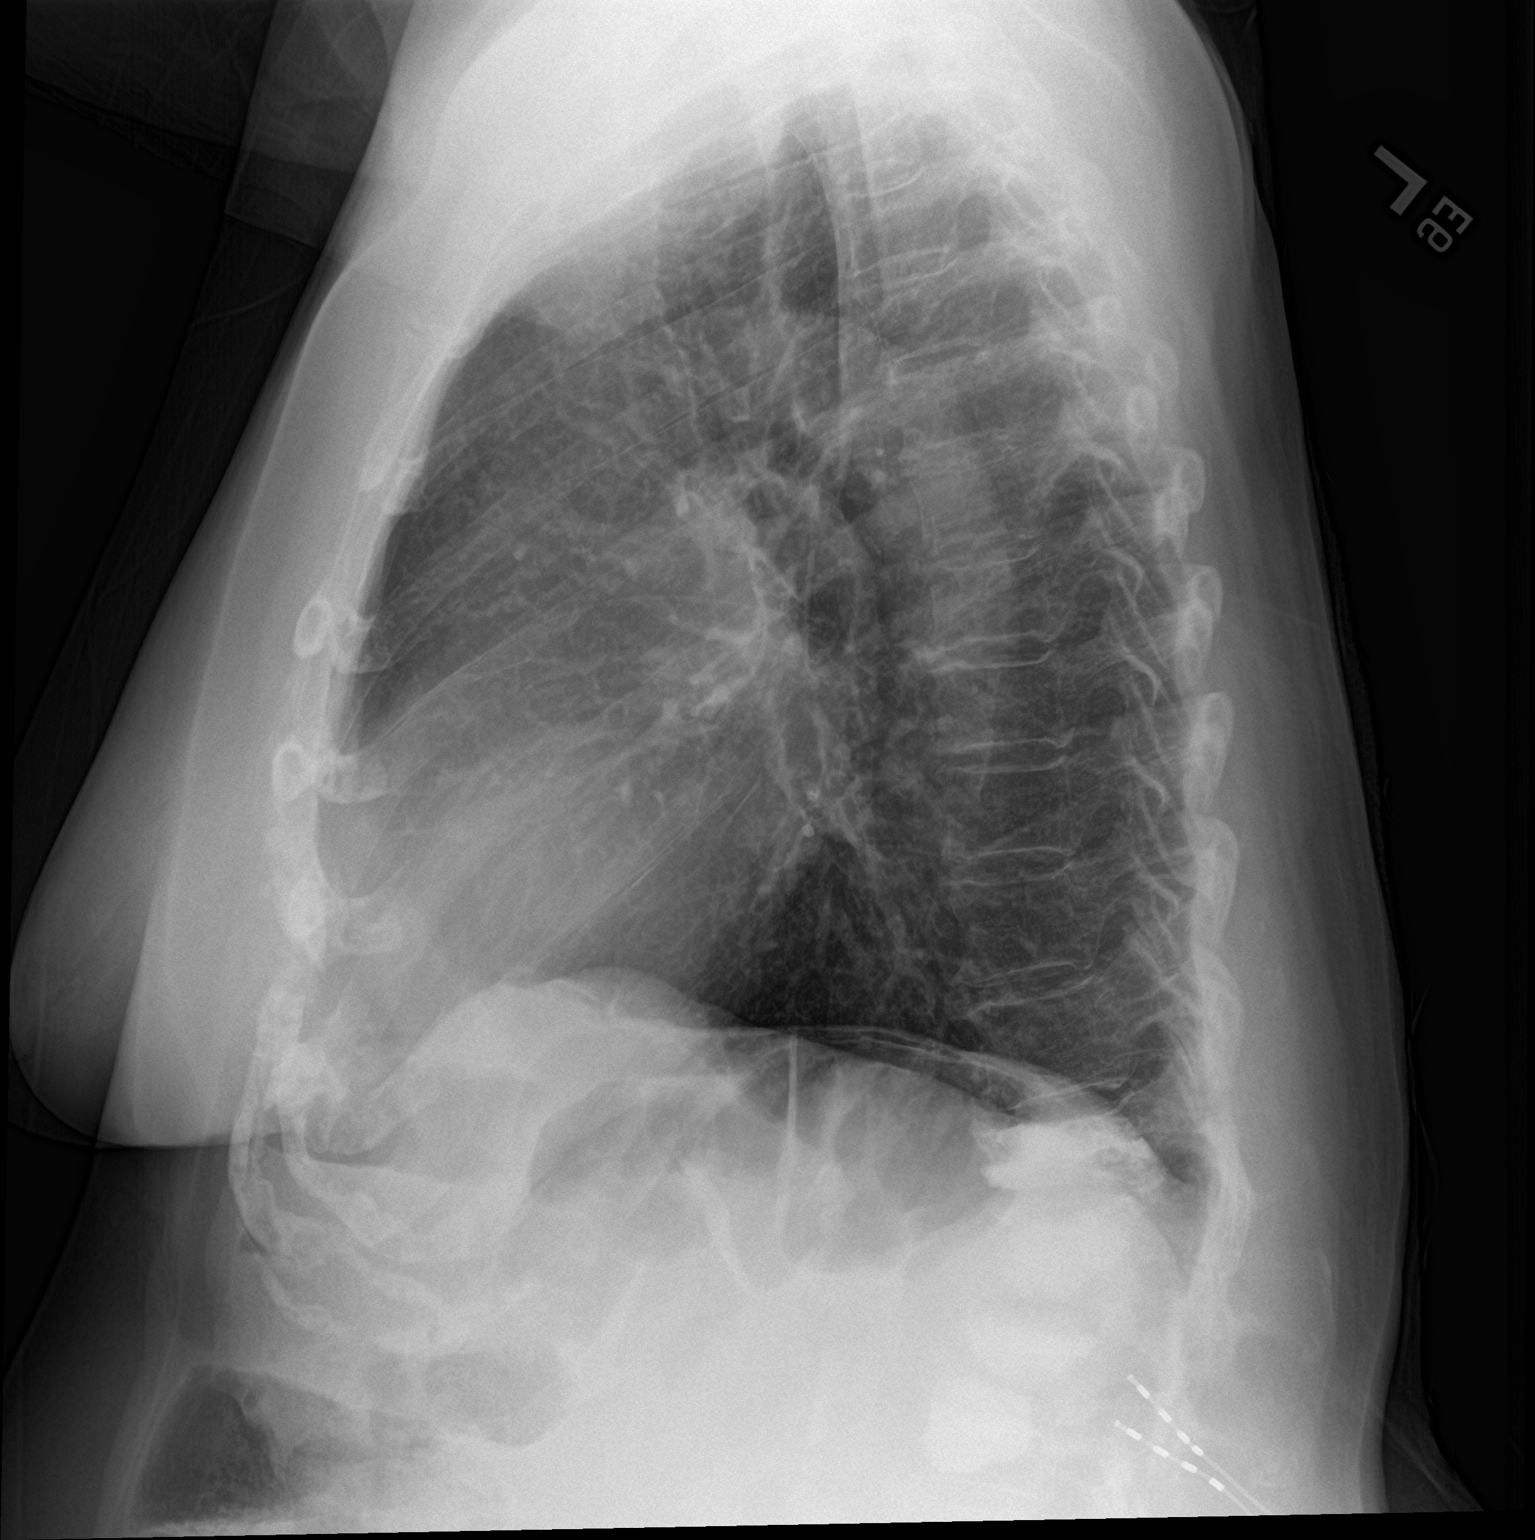

[2 of 2 positions shown; findings below may reference images not displayed]

FINDINGS: The heart size and mediastinal contours are within normal limits.
Mild scarring of the bilateral lung bases, unchanged. Disc
degenerative disease of the thoracic spine.
IMPRESSION: No acute abnormality of the lungs. Mild scarring of the bilateral
lung bases, unchanged.

## 2022-07-05 ENCOUNTER — Ambulatory Visit (INDEPENDENT_AMBULATORY_CARE_PROVIDER_SITE_OTHER): Payer: Medicare Other

## 2022-07-05 ENCOUNTER — Encounter: Payer: Self-pay | Admitting: Medical

## 2022-07-05 ENCOUNTER — Other Ambulatory Visit
Admission: RE | Admit: 2022-07-05 | Discharge: 2022-07-05 | Disposition: A | Discharge status: Home / Self Care | Payer: Medicare Other | Source: Ambulatory Visit | Attending: Medical | Admitting: Medical

## 2022-07-05 ENCOUNTER — Ambulatory Visit: Payer: Medicare Other | Attending: Medical | Admitting: Medical

## 2022-07-05 VITALS — BP 112/74 | HR 81 | Ht 61.0 in | Wt 152.0 lb

## 2022-07-05 DIAGNOSIS — R000 Tachycardia, unspecified: Secondary | ICD-10-CM | POA: Diagnosis present

## 2022-07-05 DIAGNOSIS — R002 Palpitations: Secondary | ICD-10-CM

## 2022-07-05 DIAGNOSIS — R600 Localized edema: Secondary | ICD-10-CM | POA: Insufficient documentation

## 2022-07-05 DIAGNOSIS — Z01810 Encounter for preprocedural cardiovascular examination: Secondary | ICD-10-CM | POA: Diagnosis present

## 2022-07-05 DIAGNOSIS — I10 Essential (primary) hypertension: Secondary | ICD-10-CM | POA: Diagnosis not present

## 2022-07-05 LAB — CBC
HCT: 38.3 % (ref 36.0–46.0)
Hemoglobin: 12.3 g/dL (ref 12.0–15.0)
MCH: 28.1 pg (ref 26.0–34.0)
MCHC: 32.1 g/dL (ref 30.0–36.0)
MCV: 87.6 fL (ref 80.0–100.0)
Platelets: 338 10*3/uL (ref 150–400)
RBC: 4.37 MIL/uL (ref 3.87–5.11)
RDW: 13.2 % (ref 11.5–15.5)
WBC: 8.2 10*3/uL (ref 4.0–10.5)
nRBC: 0 % (ref 0.0–0.2)

## 2022-07-05 LAB — BASIC METABOLIC PANEL
Anion gap: 8 (ref 5–15)
BUN: 18 mg/dL (ref 8–23)
CO2: 31 mmol/L (ref 22–32)
Calcium: 9 mg/dL (ref 8.9–10.3)
Chloride: 95 mmol/L — ABNORMAL LOW (ref 98–111)
Creatinine, Ser: 0.7 mg/dL (ref 0.44–1.00)
GFR, Estimated: >60 mL/min (ref >60)
Glucose, Bld: 103 mg/dL — ABNORMAL HIGH (ref 70–99)
Potassium: 3.4 mmol/L — ABNORMAL LOW (ref 3.5–5.1)
Sodium: 134 mmol/L — ABNORMAL LOW (ref 135–145)

## 2022-07-05 LAB — TSH: TSH: 0.743 u[IU]/mL (ref 0.350–4.500)

## 2022-07-05 LAB — MAGNESIUM: Magnesium: 1.8 mg/dL (ref 1.7–2.4)

## 2022-07-05 NOTE — Patient Instructions (Addendum)
Medication Instructions:  The current medical regimen is effective;  continue present plan and medications.  *If you need a refill on your cardiac medications before your next appointment, please call your pharmacy*   Lab Work: BMET, MAG, TSH,CBC today   If you have labs (blood work) drawn today and your tests are completely normal, you will receive your results only by: MyChart Message (if you have MyChart) OR A paper copy in the mail If you have any lab test that is abnormal or we need to change your treatment, we will call you to review the results.   Testing/Procedures: Christena Deem- Long Term Monitor Instructions  Your physician has requested you wear a ZIO patch monitor for 14 days.  This is a single patch monitor. [employer] supplies one patch monitor per enrollment. Additional stickers are not available. Please do not apply patch if you will be having a Nuclear Stress Test,  Echocardiogram, Cardiac CT, MRI, or Chest Xray during the period you would be wearing the  monitor. The patch cannot be worn during these tests. You cannot remove and re-apply the  ZIO XT patch monitor.  Your ZIO patch monitor will be mailed 3 day USPS to your address on file. It may take 3-5 days  to receive your monitor after you have been enrolled.  Once you have received your monitor, please review the enclosed instructions. Your monitor  has already been registered assigning a specific monitor serial # to you.  Billing and Patient Assistance Program Information  We have supplied [employer] with any of your insurance information on file for billing purposes. [employer] offers a sliding scale Patient Assistance Program for patients that do not have  insurance, or whose insurance does not completely cover the cost of the ZIO monitor.  You must apply for the Patient Assistance Program to qualify for this discounted rate.  To apply, please call [employer] at [phone], select option 4, select option 2, ask to apply  for  Patient Assistance Program. Meredeth Ide will ask your household income, and how many people  are in your household. They will quote your out-of-pocket cost based on that information.  [employer] will also be able to set up a 90-month, interest-free payment plan if needed.  Applying the monitor   Shave hair from upper left chest.  Hold abrader disc by orange tab. Rub abrader in 40 strokes over the upper left chest as  indicated in your monitor instructions.  Clean area with 4 enclosed alcohol pads. Let dry.  Apply patch as indicated in monitor instructions. Patch will be placed under collarbone on left  side of chest with arrow pointing upward.  Rub patch adhesive wings for 2 minutes. Remove white label marked "1". Remove the white  label marked "2". Rub patch adhesive wings for 2 additional minutes.  While looking in a mirror, press and release button in center of patch. A small green light will  flash 3-4 times. This will be your only indicator that the monitor has been turned on.  Do not shower for the first 24 hours. You may shower after the first 24 hours.  Press the button if you feel a symptom. You will hear a small click. Record Date, Time and  Symptom in the Patient Logbook.  When you are ready to remove the patch, follow instructions on the last 2 pages of Patient  Logbook. Stick patch monitor onto the last page of Patient Logbook.  Place Patient Logbook in the blue and white box. Use  locking tab on box and tape box closed  securely. The blue and white box has prepaid postage on it. Please place it in the mailbox as  soon as possible. Your physician should have your test results approximately 7 days after the  monitor has been mailed back to [facility].  Call [facility] Customer Care at [phone] if you have questions regarding  your ZIO XT patch monitor. Call them immediately if you see an orange light blinking on your  monitor.  If your monitor falls off in less than  4 days, contact our Monitor department at [phone].  If your monitor becomes loose or falls off after 4 days call [employer] at [phone] for  suggestions on securing your monitor    Follow-Up: At [facility], you and your health needs are our priority.  As part of our continuing mission to provide you with exceptional heart care, we have created designated Provider Care Teams.  These Care Teams include your primary Cardiologist (physician) and Advanced Practice Providers (APPs -  Physician Assistants and Nurse Practitioners) who all work together to provide you with the care you need, when you need it.  We recommend signing up for the patient portal called "MyChart".  Sign up information is provided on this After Visit Summary.  MyChart is used to connect with patients for Virtual Visits (Telemedicine).  Patients are able to view lab/test results, encounter notes, upcoming appointments, etc.  Non-urgent messages can be sent to your provider as well.   To learn more about what you can do with MyChart, go to [email or web address].    Your next appointment:   2 month(s)  Provider:   Terrilee Croak, PA-C

## 2022-07-05 NOTE — Progress Notes (Signed)
Cardiology Office Note:    Date:  07/05/2022   ID:  Rebecca Stanley, DOB 1930/10/21, MRN [id number]  PCP:  Gordan Payment., MD  [facility] Cardiologist:  Yvonne Kendall, MD  [facility] Electrophysiologist:  None   Referring MD: Gordan Payment., MD   Chief Complaint: 6 month follow-up/ER follow-up  History of Present Illness:    Rebecca Stanley is a 89 y.o. female with a hx of HFpEF, mitral valve prolapse, HTN, HLD, chronic back pain who presents for 6 month follow-up.     She previously underwent diagnostic catheterization in March 2018 at [facility], showing normal coronaries with an EF of 65%.  She subsequently underwent stress testing in September 2020 showing EF 55 to 65% without ischemia or infarct.  She was recently evaluated by Dr. Okey Dupre in May of this year following hospitalization at [facility] for tachypalpitations and documented tachycardia.  Records have not been available for review.  Patient wore a Zio monitor in June of this year which did not show any significant arrhythmias.  Triggered events were associated with sinus rhythm.  Echocardiogram earlier this month showed an EF of 55 to 60%, grade 1 diastolic dysfunction, and an elevated RVSP at 48.2 mmHg.  In that setting, she was prescribed Lasix 20 mg daily as well as potassium chloride 10 mEq daily.  She was last seen June 2023 reporting weakness and fatigue.  Patient was off Lasix due to low sodium levels.  She is on hydrochlorothiazide.  Patient had lower leg edema on exam, which seem to be a chronic issue.  Echo was ordered.  Echo showed normal LVEF 60 to 65%, grade 1 diastolic dysfunction, trivial MR.  Today, the patient reports she has a bad right knee from arthritis. She will get a knee replacement in May at [facility]. She also reports an episode of heart racing with heart rates in the 160s that landed her in the ER. She went to [facility] ER. HR was normal by the time she was in the ER. Dr. Dulce Sellar was  consulted and he recommended metoprolol. Since discharge the patient has not been taking metoprolol. She denies any recurrent palpitations or elevated heart rate. She denies chest pain, SOB. Has chronic lower leg edema. No lightheadedness or dizziness. EKG today shows NSR with a heart rate of 81bpm.   Her right knee surgery scheduled for May 23rd. She uses a Rollator all the time. Unable to walk 1-2 blocks. Cannot walk up a flight of stairs. She is mostly sedentary. She has fallen 4 times in the last 3 months due to knee pain and leg weakness. She has done PT.   Past Medical History:  Diagnosis Date   (HFpEF) heart failure with preserved ejection fraction    a. 09/2020 Echo: EF 55-60%, no rwma, Gr1DD, nl RV size/fxn. RVSP 48.70mmHg. Mild LAE. Mild MR.   Allergy    Arthritis    Blood transfusion    Cancer    Colitis    Colon polyp    Depression    Diverticulosis    Heart murmur    History of cardiac catheterization    a. 05/2016 Cath ([place]): Nl cors. EF 65%. LVEDP .   History of stress test    a. 11/2018 MV: EF 55-65%, no ischemia/infarct.   Hyperlipidemia    Hypertension    Mitral valve prolapse    a. 09/2020 Echo: Mild MR.   Osteoporosis    Palpitations    a.  07/2020 reported tachycardia-->08/2020 Zio: RSR 78 (49-143). Rare PACs/PVCs. 3 atrial runs up to 10 beats, max 162 bpm. Triggered events = sinus rhythm.   Thyroid disease     Past Surgical History:  Procedure Laterality Date   BELPHAROPTOSIS REPAIR     bladder tack     CATARACT EXTRACTION     COLONOSCOPY     DILATION AND CURETTAGE, DIAGNOSTIC / THERAPEUTIC     gluteous medius tendon rupture     done at [facility]   heel tendon repair     HIP ARTHROSCOPY W/ LABRAL REPAIR     kyploplasty     T12, L1, L2, L3, done at [facility]   PARTIAL HYSTERECTOMY      Current Medications: Current Meds  Medication Sig   amitriptyline (ELAVIL) 25 MG tablet Take 25 mg by mouth at bedtime.   Cholecalciferol (VITAMIN D PO) Take 1 tablet  by mouth daily.   clonazePAM (KLONOPIN) 0.5 MG tablet Take 0.5 mg by mouth 2 (two) times daily as needed for anxiety.   DULoxetine (CYMBALTA) 20 MG capsule Take 40 mg by mouth 2 (two) times daily.   famotidine (PEPCID) 20 MG tablet Take 1 tablet (20 mg total) by mouth 2 (two) times daily as needed for heartburn or indigestion.   furosemide (LASIX) 20 MG tablet Take 1 tablet (20 mg total) by mouth as needed (weight gain or swelling).   GEMTESA 75 MG TABS Take 1 tablet by mouth daily.   hydrALAZINE (APRESOLINE) 10 MG tablet Take 10 mg by mouth 3 (three) times daily.   hydrochlorothiazide (HYDRODIURIL) 25 MG tablet Take 1 tablet by mouth in the morning.   levothyroxine (SYNTHROID, LEVOTHROID) 25 MCG tablet Take 62.5 mcg by mouth daily.   metoprolol succinate (TOPROL-XL) 25 MG 24 hr tablet Take 12.5 mg by mouth daily. Family doctor wants to change to 12.5mg  instead of 25mg    nitroGLYCERIN (NITROSTAT) 0.4 MG SL tablet Place 1 tablet (0.4 mg total) under the tongue every 5 (five) minutes as needed for chest pain. Maximum of 3 doses.   pregabalin (LYRICA) 100 MG capsule Take 100 mg by mouth daily. At 4 pm   pregabalin (LYRICA) 50 MG capsule Take 25 mg by mouth at bedtime.     Allergies:   Losartan, Amlodipine, Cholestyramine, Cortisone, Prednisone, Tramadol, and Lisinopril   Social History   Socioeconomic History   Marital status: Widowed    Spouse name: Not on file   Number of children: 1   Years of education: Not on file   Highest education level: Not on file  Occupational History   Occupation: retired  Tobacco Use   Smoking status: Never   Smokeless tobacco: Never  Vaping Use   Vaping Use: Never used  Substance and Sexual Activity   Alcohol use: No   Drug use: No   Sexual activity: Not on file  Other Topics Concern   Not on file  Social History Narrative   Not on file   Social Determinants of Health   Financial Resource Strain: Not on file  Food Insecurity: Not on file   Transportation Needs: Not on file  Physical Activity: Not on file  Stress: Not on file  Social Connections: Not on file     Family History: The patient's family history includes Breast cancer in an other family member; Diabetes in an other family member; Heart disease in her mother and another family member; Ovarian cancer in an other family member; Rheum arthritis in her father. There  is no history of Colon cancer.  ROS:   Please see the history of present illness.     All other systems reviewed and are negative.  EKGs/Labs/Other Studies Reviewed:    The following studies were reviewed today:  Echo 09/2021   1. Left ventricular ejection fraction, by estimation, is 60 to 65%. The  left ventricle has normal function. The left ventricle has no regional  wall motion abnormalities. Left ventricular diastolic parameters are  consistent with Grade I diastolic  dysfunction (impaired relaxation).   2. Right ventricular systolic function is normal. The right ventricular  size is normal. There is mildly elevated pulmonary artery systolic  pressure. The estimated right ventricular systolic pressure is 41.2 mmHg.   3. The mitral valve is normal in structure. Trivial mitral valve  regurgitation. No evidence of mitral stenosis.   4. The aortic valve is tricuspid. Aortic valve regurgitation is not  visualized. No aortic stenosis is present.   5. The inferior vena cava is normal in size with greater than 50%  respiratory variability, suggesting right atrial pressure of 3 mmHg.   Echo 09/2020  1. Left ventricular ejection fraction, by estimation, is 55 to 60%. The  left ventricle has normal function. The left ventricle has no regional  wall motion abnormalities. Left ventricular diastolic parameters are  consistent with Grade I diastolic  dysfunction (impaired relaxation).   2. Right ventricular systolic function is normal. The right ventricular  size is normal. There is moderately elevated  pulmonary artery systolic  pressure. The estimated right ventricular systolic pressure is 48.2 mmHg.   3. Left atrial size was mildly dilated.   4. The mitral valve is normal in structure. Mild mitral valve  regurgitation.   5. The inferior vena cava is dilated in size with >50% respiratory  variability, suggesting right atrial pressure of 8 mmHg.    Heart monitor 08/2020 The patient was monitored for 13 days, 2 hours. The predominant rhythm was sinus with an average rate of 78 bpm (range 49-143 bpm and sinus). There were rare PACs and PVCs. 3 atrial runs lasting up to 10 beats with a maximum rate of 162 bpm were observed. No sustained arrhythmia or prolonged pause occurred. Patient triggered events correspond to sinus rhythm.   Predominantly sinus rhythm with rare PACs and PVCs as well as 3 brief episodes of PSVT.  No significant arrhythmia observed to explain patient's symptoms.   Myoview Lexiscan 07/2020 Narrative & Impression  There was no ST segment deviation noted during stress. No T wave inversion was noted during stress. The study is normal. This is a low risk study. The left ventricular ejection fraction is normal (55-65%).    EKG:  EKG is  ordered today.  The ekg ordered today demonstrates normal sinus rhythm, 81 bpm, nonspecific T wave changes  Recent Labs: 07/05/2022: Hemoglobin 12.3; Platelets 338  Recent Lipid Panel    Component Value Date/Time   CHOL 306 (H) 06/12/2019 1711   TRIG 206 (H) 06/12/2019 1711   HDL 63 06/12/2019 1711   CHOLHDL 4.9 06/12/2019 1711   VLDL 41 (H) 06/12/2019 1711   LDLCALC 202 (H) 06/12/2019 1711   LDLDIRECT 188.3 (H) 06/12/2019 1711   Physical Exam:    VS:  BP 112/74 (BP Location: Right Arm, Patient Position: Sitting, Cuff Size: Normal)   Pulse 81   Ht 5\' 1"  (1.549 m)   Wt 152 lb (68.9 kg)   SpO2 94%   BMI 28.72 kg/m  Wt Readings from Last 3 Encounters:  07/05/22 152 lb (68.9 kg)  04/27/22 151 lb (68.5 kg)  09/22/21 155 lb  (70.3 kg)     GEN:  Well nourished, well developed in no acute distress HEENT: Normal NECK: No JVD; No carotid bruits LYMPHATICS: No lymphadenopathy CARDIAC: RRR, no murmurs, rubs, gallops RESPIRATORY:  Clear to auscultation without rales, wheezing or rhonchi  ABDOMEN: Soft, non-tender, non-distended MUSCULOSKELETAL:  No edema; No deformity  SKIN: Warm and dry NEUROLOGIC:  Alert and oriented x 3 PSYCHIATRIC:  Normal affect   ASSESSMENT:    1. Tachycardia   2. Palpitations   3. Essential hypertension   4. Leg edema   5. Pre-operative cardiovascular examination    PLAN:    In order of problems listed above:  Tachycardia/Palpitations Patient reports ER visit for elevated heart rate and palpitations 3 weeks ago at the [facility] ER.  She reported heart rates up to the 160s with improvement prior to ER arrival.  Reportedly potassium was low and she was given potassium. Other wise labs and work-up was unremarkable. Noted Dr. Dulce Sellar was consulted and he recommended starting metoprolol, however she since discontinued it.  Since that visit patient has overall been doing well with no recurrent elevated heart rate or palpitations.  No chest pain or shortness of breath.  EKG today shows normal sinus rhythm with a heart rate of 81 bpm. I will check a CBC, BMP, mag, TSH.  I will order a 2-week heart monitor.  HTN Pressure is good today, continue hydralazine 10 mg 3 times daily and hydrochlorothiazide 25 mg daily.  Lower leg edema She has chronic stable lower leg edema likely due to venous insufficieny.  She takes Lasix 20 mg as needed for weight gain or swelling.  Preop cardiac evaluation for right knee replacement Patient is scheduled for knee replacement May 2032.  Patient with episode of elevated heart rate and palpitations as above with a plan for 2-week heart monitor.  Overall patient is otherwise stable with no chest pain, shortness of breath, lower leg edema, orthopnea, PND.  Most recent  echo showed normal LVEF with grade 1 diastolic dysfunction.  Patient has been using a rollator at home.  Knee pain has progressively limited her function.  Patient is not on any blood thinners.  METS less than 4.  If heart monitor is unremarkable, okay to proceed with surgery.  According to the revised cardiac risk index patient is class II risk with 6% 30-day risk of death, MI, cardiac arrest.    Disposition: Follow up in 2 month(s) with MD/APP    Signed, Gussie Murton David Stall, PA-C  07/05/2022 4:17 PM    [facility]

## 2022-07-07 ENCOUNTER — Other Ambulatory Visit: Payer: Self-pay

## 2022-07-07 DIAGNOSIS — R002 Palpitations: Secondary | ICD-10-CM

## 2022-07-07 DIAGNOSIS — R000 Tachycardia, unspecified: Secondary | ICD-10-CM | POA: Diagnosis not present

## 2022-07-07 MED ORDER — POTASSIUM CHLORIDE CRYS ER 10 MEQ PO TBCR: 10.0000 meq | EXTENDED_RELEASE_TABLET | Freq: Every day | ORAL | 2 refills | Status: DC | PRN

## 2022-07-26 ENCOUNTER — Telehealth: Payer: Self-pay | Admitting: Internal Medicine

## 2022-07-26 NOTE — Telephone Encounter (Signed)
Spoke with patients son per release form. He wanted to inquire if there was anyway to expedite her monitor results due to her having upcoming surgery. Advised that they are pending provider review and we will give them a call back to review those results. He verbalized understanding with no further questions.

## 2022-07-26 NOTE — Telephone Encounter (Signed)
Patient's son is calling because they would like to know if they can have the results from the heart monitor expedited. Patient's son stated that the patient is have knee replacement on 08/04/22. Patient needs the results before they will proceed with the upcoming procedure. Please advise.

## 2022-07-27 NOTE — Telephone Encounter (Signed)
Son is calling for update and looking for the monitor results to be release. Please advise

## 2022-07-27 NOTE — Telephone Encounter (Signed)
Left a message for the patient to call back.    Furth, Cadence H, PA-C  Earl Lagos KYesterday (9:53 AM)      Patient had a min HR of 49 bpm, max HR of 174 bpm, and avg HR of 79 bpm. Predominant underlying rhythm was Sinus Rhythm. 7 Supraventricular Tachycardia runs occurred, the run with the fastest interval lasting 4 beats with a max rate of 174 bpm, the longest lasting 31.6 secs with an avg rate of 151 bpm. Isolated SVEs were rare (<1.0%), SVE Couplets were rare (<1.0%), and SVE Triplets were rare (<1.0%). Isolated VEs were rare (<1.0%), and no VE Couplets or VE Triplets were present.  Overall heart monitor showed predominantly normal rhythm with 7 episodes of fast heart rate longest lasting 31.6 seconds.  As long as patient was asymptomatic, we will continue current regimen.  We can discuss symptoms at follow-up.  We may need to increase beta-blocker at follow-up.

## 2022-07-29 NOTE — Telephone Encounter (Signed)
Letter generated and faxed to surgeon office with the following information.    Furth, Cadence H, PA-C  You56 minutes ago (1:11 PM)    It is ok if patient undergoes knee replacement surgery

## 2022-08-01 ENCOUNTER — Telehealth: Payer: Self-pay | Admitting: *Deleted

## 2022-08-01 NOTE — Telephone Encounter (Signed)
   Pre-operative Risk Assessment    Patient Name: Rebecca Stanley  DOB: 02/27/1931 MRN: [id number]      Request for Surgical Clearance    Procedure:   Knee Arthroplasty  Date of Surgery:  Clearance 08/04/22                                 Surgeon:  Dr. Rexanne Mano - Richardson Dopp Surgeon's Group or Practice Name:  [facility] Phone number:  [phone] Fax number:  [phone]   Type of Clearance Requested:   - Medical    Type of Anesthesia:  Spinal   Additional requests/questions:    Signed, Emmit Pomfret   08/01/2022, 7:51 AM

## 2022-08-01 NOTE — Telephone Encounter (Signed)
   Patient Name: Rebecca Stanley  DOB: 14-Apr-1930 MRN: [id number]  Primary Cardiologist: Yvonne Kendall, MD  Chart reviewed as part of pre-operative protocol coverage. Given past medical history and time since last visit, based on ACC/AHA guidelines, KATALYNA MACHAK is at acceptable risk for the planned procedure without further cardiovascular testing (see attached clearance letter).   I will route this recommendation to the requesting party via Epic fax function and remove from pre-op pool.  Please call with questions.  Joylene Grapes, NP 08/01/2022, 10:58 AM

## 2022-08-14 ENCOUNTER — Telehealth: Payer: Self-pay | Admitting: Home Health

## 2022-08-14 NOTE — Telephone Encounter (Signed)
Patient and her son Brett Canales called after hour line today, reports she has HR 168 today and BP 122/72, she feels her heart rate is irregular, tired, otherwise no chest pain, SOB, syncope. She had recent visit to ER with similar complaints. She had seen in the office in 06/2022 and had 2 week Zio showed NSR, rare PVC and PACs, 7 episodes of fast heart rate lasting up to 32 second. Advised the patient and son to continue monitor her heart rate and symptoms, keep a log regarding this, bring to next visit to determine if metoprolol should be up-titrated.

## 2022-08-16 NOTE — Telephone Encounter (Signed)
Left a message for the patient to call back.    Fransico Michael, Cadence H, PA-C  You37 minutes ago (4:33 PM)    in my last note, it says she had stopped the metoprolol. So lets's restart it at 12.5mg  daily. Hopefully this helps heart rate and symptoms.

## 2022-08-17 NOTE — Telephone Encounter (Signed)
Pt son returning call

## 2022-08-17 NOTE — Telephone Encounter (Signed)
Spoke to patient's son and informed him of the provider's recommendations as follows:  "In my last note, it says she had stopped the metoprolol. So lets's restart it at 12.5mg  daily. Hopefully this helps heart rate and symptoms."  Patient's son understood with read back

## 2022-09-06 ENCOUNTER — Ambulatory Visit: Payer: Medicare Other | Admitting: Medical

## 2022-09-19 ENCOUNTER — Ambulatory Visit: Payer: Medicare Other | Admitting: Medical

## 2022-09-20 ENCOUNTER — Encounter (HOSPITAL_BASED_OUTPATIENT_CLINIC_OR_DEPARTMENT_OTHER): Payer: Self-pay

## 2022-09-20 ENCOUNTER — Other Ambulatory Visit: Payer: Self-pay

## 2022-09-20 ENCOUNTER — Emergency Department (HOSPITAL_BASED_OUTPATIENT_CLINIC_OR_DEPARTMENT_OTHER): Payer: Medicare Other

## 2022-09-20 ENCOUNTER — Inpatient Hospital Stay (HOSPITAL_BASED_OUTPATIENT_CLINIC_OR_DEPARTMENT_OTHER)
Admission: EM | Admit: 2022-09-20 | Discharge: 2022-09-23 | DRG: 189 | Disposition: A | Discharge status: Home / Self Care | Payer: Medicare Other | Attending: Internal Medicine | Admitting: Internal Medicine

## 2022-09-20 DIAGNOSIS — Z79899 Other long term (current) drug therapy: Secondary | ICD-10-CM

## 2022-09-20 DIAGNOSIS — J209 Acute bronchitis, unspecified: Secondary | ICD-10-CM

## 2022-09-20 DIAGNOSIS — E876 Hypokalemia: Secondary | ICD-10-CM | POA: Diagnosis present

## 2022-09-20 DIAGNOSIS — F410 Panic disorder [episodic paroxysmal anxiety] without agoraphobia: Secondary | ICD-10-CM | POA: Diagnosis present

## 2022-09-20 DIAGNOSIS — E872 Acidosis, unspecified: Secondary | ICD-10-CM | POA: Diagnosis not present

## 2022-09-20 DIAGNOSIS — M810 Age-related osteoporosis without current pathological fracture: Secondary | ICD-10-CM | POA: Diagnosis present

## 2022-09-20 DIAGNOSIS — Z87440 Personal history of urinary (tract) infections: Secondary | ICD-10-CM

## 2022-09-20 DIAGNOSIS — J9601 Acute respiratory failure with hypoxia: Secondary | ICD-10-CM | POA: Diagnosis present

## 2022-09-20 DIAGNOSIS — I110 Hypertensive heart disease with heart failure: Secondary | ICD-10-CM | POA: Diagnosis not present

## 2022-09-20 DIAGNOSIS — Z79890 Hormone replacement therapy (postmenopausal): Secondary | ICD-10-CM

## 2022-09-20 DIAGNOSIS — Z86010 Personal history of colonic polyps: Secondary | ICD-10-CM

## 2022-09-20 DIAGNOSIS — I10 Essential (primary) hypertension: Secondary | ICD-10-CM | POA: Diagnosis not present

## 2022-09-20 DIAGNOSIS — Z90711 Acquired absence of uterus with remaining cervical stump: Secondary | ICD-10-CM

## 2022-09-20 DIAGNOSIS — Z803 Family history of malignant neoplasm of breast: Secondary | ICD-10-CM | POA: Diagnosis not present

## 2022-09-20 DIAGNOSIS — J9811 Atelectasis: Secondary | ICD-10-CM | POA: Diagnosis not present

## 2022-09-20 DIAGNOSIS — Z96651 Presence of right artificial knee joint: Secondary | ICD-10-CM | POA: Diagnosis not present

## 2022-09-20 DIAGNOSIS — D649 Anemia, unspecified: Secondary | ICD-10-CM | POA: Diagnosis not present

## 2022-09-20 DIAGNOSIS — Z8261 Family history of arthritis: Secondary | ICD-10-CM | POA: Diagnosis not present

## 2022-09-20 DIAGNOSIS — E785 Hyperlipidemia, unspecified: Secondary | ICD-10-CM | POA: Diagnosis not present

## 2022-09-20 DIAGNOSIS — G629 Polyneuropathy, unspecified: Secondary | ICD-10-CM

## 2022-09-20 DIAGNOSIS — I341 Nonrheumatic mitral (valve) prolapse: Secondary | ICD-10-CM | POA: Diagnosis present

## 2022-09-20 DIAGNOSIS — Z8041 Family history of malignant neoplasm of ovary: Secondary | ICD-10-CM | POA: Diagnosis not present

## 2022-09-20 DIAGNOSIS — L89152 Pressure ulcer of sacral region, stage 2: Secondary | ICD-10-CM | POA: Diagnosis present

## 2022-09-20 DIAGNOSIS — K5900 Constipation, unspecified: Secondary | ICD-10-CM | POA: Diagnosis present

## 2022-09-20 DIAGNOSIS — Z66 Do not resuscitate: Secondary | ICD-10-CM | POA: Diagnosis not present

## 2022-09-20 DIAGNOSIS — Z833 Family history of diabetes mellitus: Secondary | ICD-10-CM

## 2022-09-20 DIAGNOSIS — Z8249 Family history of ischemic heart disease and other diseases of the circulatory system: Secondary | ICD-10-CM

## 2022-09-20 DIAGNOSIS — I5033 Acute on chronic diastolic (congestive) heart failure: Secondary | ICD-10-CM

## 2022-09-20 DIAGNOSIS — E039 Hypothyroidism, unspecified: Secondary | ICD-10-CM

## 2022-09-20 LAB — LACTIC ACID, PLASMA: Lactic Acid, Venous: 0.8 mmol/L (ref 0.5–1.9)

## 2022-09-20 LAB — BASIC METABOLIC PANEL
Anion gap: 10 (ref 5–15)
BUN: 18 mg/dL (ref 8–23)
CO2: 26 mmol/L (ref 22–32)
Calcium: 9.2 mg/dL (ref 8.9–10.3)
Chloride: 97 mmol/L — ABNORMAL LOW (ref 98–111)
Creatinine, Ser: 0.71 mg/dL (ref 0.44–1.00)
GFR, Estimated: >60 mL/min (ref >60)
Glucose, Bld: 91 mg/dL (ref 70–99)
Potassium: 3.9 mmol/L (ref 3.5–5.1)
Sodium: 133 mmol/L — ABNORMAL LOW (ref 135–145)

## 2022-09-20 LAB — CBC
HCT: 35.9 % — ABNORMAL LOW (ref 36.0–46.0)
Hemoglobin: 11.4 g/dL — ABNORMAL LOW (ref 12.0–15.0)
MCH: 27.7 pg (ref 26.0–34.0)
MCHC: 31.8 g/dL (ref 30.0–36.0)
MCV: 87.3 fL (ref 80.0–100.0)
Platelets: 383 10*3/uL (ref 150–400)
RBC: 4.11 MIL/uL (ref 3.87–5.11)
RDW: 14.4 % (ref 11.5–15.5)
WBC: 7.8 10*3/uL (ref 4.0–10.5)
nRBC: 0 % (ref 0.0–0.2)

## 2022-09-20 LAB — TROPONIN I (HIGH SENSITIVITY): Troponin I (High Sensitivity): 8 ng/L (ref <18)

## 2022-09-20 LAB — URINALYSIS, ROUTINE W REFLEX MICROSCOPIC
Bilirubin Urine: NEGATIVE
Glucose, UA: NEGATIVE mg/dL
Ketones, ur: NEGATIVE mg/dL
Nitrite: NEGATIVE
Protein, ur: NEGATIVE mg/dL
Specific Gravity, Urine: 1.01 (ref 1.005–1.030)
pH: 6 (ref 5.0–8.0)

## 2022-09-20 LAB — HEPATIC FUNCTION PANEL
ALT: 18 U/L (ref 0–44)
AST: 25 U/L (ref 15–41)
Albumin: 4 g/dL (ref 3.5–5.0)
Alkaline Phosphatase: 58 U/L (ref 38–126)
Bilirubin, Direct: <0.1 mg/dL (ref 0.0–0.2)
Total Bilirubin: 0.7 mg/dL (ref 0.3–1.2)
Total Protein: 7.5 g/dL (ref 6.5–8.1)

## 2022-09-20 LAB — URINALYSIS, MICROSCOPIC (REFLEX)

## 2022-09-20 LAB — LIPASE, BLOOD: Lipase: 34 U/L (ref 11–51)

## 2022-09-20 LAB — CBG MONITORING, ED: Glucose-Capillary: 84 mg/dL (ref 70–99)

## 2022-09-20 MED ORDER — GUAIFENESIN ER 600 MG PO TB12
600.0000 mg | ORAL_TABLET | Freq: Two times a day (BID) | ORAL | Status: DC
  Administered 2022-09-21 – 2022-09-23 (×6): 600 mg via ORAL
  Filled 2022-09-20 (×7): qty 1

## 2022-09-20 MED ORDER — ONDANSETRON HCL 4 MG PO TABS: 4.0000 mg | ORAL_TABLET | Freq: Four times a day (QID) | ORAL | Status: DC | PRN

## 2022-09-20 MED ORDER — LEVOTHYROXINE SODIUM 125 MCG PO TABS
62.5000 ug | ORAL_TABLET | Freq: Every day | ORAL | Status: DC
  Administered 2022-09-21 – 2022-09-23 (×3): 62.5 ug via ORAL
  Filled 2022-09-20 (×3): qty 1

## 2022-09-20 MED ORDER — MAGNESIUM HYDROXIDE 400 MG/5ML PO SUSP: 30.0000 mL | Freq: Every day | ORAL | Status: DC | PRN

## 2022-09-20 MED ORDER — HYDROCHLOROTHIAZIDE 25 MG PO TABS: 25.0000 mg | ORAL_TABLET | Freq: Every day | ORAL | Status: DC

## 2022-09-20 MED ORDER — IPRATROPIUM-ALBUTEROL 0.5-2.5 (3) MG/3ML IN SOLN
3.0000 mL | Freq: Four times a day (QID) | RESPIRATORY_TRACT | Status: DC
  Administered 2022-09-21: 3 mL via RESPIRATORY_TRACT
  Filled 2022-09-20: qty 3

## 2022-09-20 MED ORDER — PREGABALIN 50 MG PO CAPS
100.0000 mg | ORAL_CAPSULE | Freq: Every day | ORAL | Status: DC
  Administered 2022-09-21 – 2022-09-22 (×2): 100 mg via ORAL
  Filled 2022-09-20 (×2): qty 2

## 2022-09-20 MED ORDER — IPRATROPIUM-ALBUTEROL 0.5-2.5 (3) MG/3ML IN SOLN
3.0000 mL | Freq: Once | RESPIRATORY_TRACT | Status: AC
  Administered 2022-09-20: 3 mL via RESPIRATORY_TRACT
  Filled 2022-09-20: qty 3

## 2022-09-20 MED ORDER — NITROGLYCERIN 0.4 MG SL SUBL
0.4000 mg | SUBLINGUAL_TABLET | SUBLINGUAL | Status: DC | PRN
  Administered 2022-09-21 (×3): 0.4 mg via SUBLINGUAL
  Filled 2022-09-20: qty 1

## 2022-09-20 MED ORDER — DULOXETINE HCL 20 MG PO CPEP
40.0000 mg | ORAL_CAPSULE | Freq: Two times a day (BID) | ORAL | Status: DC
  Administered 2022-09-21 – 2022-09-23 (×6): 40 mg via ORAL
  Filled 2022-09-20 (×7): qty 2

## 2022-09-20 MED ORDER — VITAMIN D 25 MCG (1000 UNIT) PO TABS
1000.0000 [IU] | ORAL_TABLET | Freq: Every day | ORAL | Status: DC
  Administered 2022-09-21 – 2022-09-23 (×3): 1000 [IU] via ORAL
  Filled 2022-09-20 (×3): qty 1

## 2022-09-20 MED ORDER — METHYLPREDNISOLONE SODIUM SUCC 125 MG IJ SOLR
125.0000 mg | Freq: Once | INTRAMUSCULAR | Status: AC
  Administered 2022-09-20: 125 mg via INTRAVENOUS
  Filled 2022-09-20: qty 2

## 2022-09-20 MED ORDER — ENOXAPARIN SODIUM 40 MG/0.4ML IJ SOSY
40.0000 mg | PREFILLED_SYRINGE | INTRAMUSCULAR | Status: DC
  Administered 2022-09-21 – 2022-09-23 (×3): 40 mg via SUBCUTANEOUS
  Filled 2022-09-20 (×3): qty 0.4

## 2022-09-20 MED ORDER — SODIUM CHLORIDE 0.9 % IV SOLN
500.0000 mg | Freq: Once | INTRAVENOUS | Status: AC
  Administered 2022-09-20: 500 mg via INTRAVENOUS
  Filled 2022-09-20: qty 5

## 2022-09-20 MED ORDER — CLONAZEPAM 0.5 MG PO TABS
0.5000 mg | ORAL_TABLET | Freq: Two times a day (BID) | ORAL | Status: DC | PRN
  Administered 2022-09-21 (×2): 0.5 mg via ORAL
  Filled 2022-09-20 (×2): qty 1

## 2022-09-20 MED ORDER — SODIUM CHLORIDE 0.9 % IV SOLN
2.0000 g | Freq: Once | INTRAVENOUS | Status: AC
  Administered 2022-09-20: 2 g via INTRAVENOUS
  Filled 2022-09-20: qty 20

## 2022-09-20 MED ORDER — FAMOTIDINE 20 MG PO TABS: 20.0000 mg | ORAL_TABLET | Freq: Two times a day (BID) | ORAL | Status: DC | PRN

## 2022-09-20 MED ORDER — ONDANSETRON HCL 4 MG/2ML IJ SOLN: 4.0000 mg | Freq: Four times a day (QID) | INTRAMUSCULAR | Status: DC | PRN

## 2022-09-20 MED ORDER — HYDRALAZINE HCL 10 MG PO TABS
10.0000 mg | ORAL_TABLET | Freq: Three times a day (TID) | ORAL | Status: DC
  Filled 2022-09-20 (×2): qty 1

## 2022-09-20 MED ORDER — ACETAMINOPHEN 650 MG RE SUPP: 650.0000 mg | Freq: Four times a day (QID) | RECTAL | Status: DC | PRN

## 2022-09-20 MED ORDER — ACETAMINOPHEN 325 MG PO TABS
650.0000 mg | ORAL_TABLET | Freq: Four times a day (QID) | ORAL | Status: DC | PRN
  Administered 2022-09-21 (×2): 650 mg via ORAL
  Filled 2022-09-20 (×3): qty 2

## 2022-09-20 MED ORDER — HYDROCOD POLI-CHLORPHE POLI ER 10-8 MG/5ML PO SUER: 5.0000 mL | Freq: Two times a day (BID) | ORAL | Status: DC | PRN

## 2022-09-20 MED ORDER — IOHEXOL 350 MG/ML SOLN
75.0000 mL | Freq: Once | INTRAVENOUS | Status: AC | PRN
  Administered 2022-09-20: 75 mL via INTRAVENOUS

## 2022-09-20 MED ORDER — POTASSIUM CHLORIDE CRYS ER 10 MEQ PO TBCR
10.0000 meq | EXTENDED_RELEASE_TABLET | Freq: Every day | ORAL | Status: DC
  Administered 2022-09-21 – 2022-09-23 (×3): 10 meq via ORAL
  Filled 2022-09-20 (×3): qty 1

## 2022-09-20 MED ORDER — METHYLPREDNISOLONE SODIUM SUCC 125 MG IJ SOLR
40.0000 mg | Freq: Three times a day (TID) | INTRAMUSCULAR | Status: DC
  Administered 2022-09-21: 40 mg via INTRAVENOUS
  Filled 2022-09-20: qty 2

## 2022-09-20 MED ORDER — METOPROLOL SUCCINATE ER 25 MG PO TB24
12.5000 mg | ORAL_TABLET | Freq: Every day | ORAL | Status: DC
  Administered 2022-09-21 – 2022-09-23 (×3): 12.5 mg via ORAL
  Filled 2022-09-20 (×3): qty 1

## 2022-09-20 MED ORDER — TRAZODONE HCL 50 MG PO TABS
25.0000 mg | ORAL_TABLET | Freq: Every evening | ORAL | Status: DC | PRN
  Administered 2022-09-21 – 2022-09-22 (×2): 25 mg via ORAL
  Filled 2022-09-20 (×2): qty 1

## 2022-09-20 NOTE — ED Notes (Signed)
[facility] called for transport @20:08 

## 2022-09-20 NOTE — Progress Notes (Signed)
Plan of Care Note for accepted transfer   Patient: Rebecca Stanley MRN: [id number]   DOA: 09/20/2022  Facility requesting transfer: [facility] Requesting Provider: Adela Lank Reason for transfer: Bronchitis / hypoxia Facility course: 89 yo F, h/o TKA x6 weeks ago followed by pseudomonas UTI.  Now in to ED with SOB and wheezing onset last night.  No prior h/o COPD.  Got ABx in ED but CTA chest neg for PE and PNA.  Got steroids and nebs in ED.  Desats to upper 80s with ambulation still.  Plan of care: The patient is accepted for admission to Med-surg  unit, at [facility]   [facility] will assume care on arrival to accepting facility. Until arrival, care as per EDP. However, [facility] available 24/7 for questions and assistance.  Nursing staff, please page [facility] Admits and Consults [phone]) as soon as the patient arrives to the hospital.    Author: Hillary Bow., DO 09/20/2022  Check [email or web address] for on-call coverage.  Nursing staff, Please call [facility] Admits & Consults System-Wide number on Amion as soon as patient's arrival, so appropriate admitting provider can evaluate the pt.

## 2022-09-20 NOTE — ED Notes (Signed)
Pt. Report she just is not feeling well.  She states she is feeling worse today than she felt last week.  Pt. Husband said she started to cough last night and she has been wheezing at night.  Pt. Has had a recent knee replacement 6 wks ago.

## 2022-09-20 NOTE — ED Provider Notes (Signed)
[place] [facility] Provider Note   CSN: [id number] Arrival date & time: 09/20/22  1516     History  Chief Complaint  Patient presents with   Weakness    Rebecca Stanley is a 89 y.o. female.  89 yo F with a chief complaints have feeling fatigued.  This been going on for couple weeks.  The patient just had knee surgery about 6 weeks ago.  That has been doing well for her.  She saw her orthopedic surgeon yesterday they were pleased by how things were going.  She has been coughing a little bit over the past few days.  Was treated with urinary tract infection over a week ago.  She had Pseudomonas and so was transition from 1 antibiotic to Cipro.  She has had some mild chest pain off and on.  Her urinary symptoms had improved and then reoccurred.  No fevers.  No abdominal pain.   Weakness      Home Medications Prior to Admission medications   Medication Sig Start Date End Date Taking? Authorizing Provider  potassium chloride SA (KLOR-CON M) 10 MEQ tablet Take 1 tablet (10 mEq total) by mouth daily as needed. When you take lasix 07/07/22 10/05/22  Furth, Cadence H, PA-C  amitriptyline (ELAVIL) 25 MG tablet Take 25 mg by mouth at bedtime.    [provider]  Cholecalciferol (VITAMIN D PO) Take 1 tablet by mouth daily.    [provider]  clonazePAM (KLONOPIN) 0.5 MG tablet Take 0.5 mg by mouth 2 (two) times daily as needed for anxiety.    [provider]  DULoxetine (CYMBALTA) 20 MG capsule Take 40 mg by mouth 2 (two) times daily. 05/13/20   [provider]  famotidine (PEPCID) 20 MG tablet Take 1 tablet (20 mg total) by mouth 2 (two) times daily as needed for heartburn or indigestion. 06/12/19   End, Cristal Deer, MD  furosemide (LASIX) 20 MG tablet Take 1 tablet (20 mg total) by mouth as needed (weight gain or swelling). 01/20/21   End, Cristal Deer, MD  GEMTESA 75 MG TABS Take 1 tablet by mouth daily.    [provider]  hydrALAZINE (APRESOLINE) 10 MG tablet Take 10 mg by mouth 3 (three) times daily.    [provider]  hydrochlorothiazide (HYDRODIURIL) 25 MG tablet Take 1 tablet by mouth in the morning. 01/28/21   [provider]  levothyroxine (SYNTHROID, LEVOTHROID) 25 MCG tablet Take 62.5 mcg by mouth daily.    [provider]  metoprolol succinate (TOPROL-XL) 25 MG 24 hr tablet Take 12.5 mg by mouth daily. Family doctor wants to change to 12.5mg  instead of 25mg  06/22/22   [provider]  nitroGLYCERIN (NITROSTAT) 0.4 MG SL tablet Place 1 tablet (0.4 mg total) under the tongue every 5 (five) minutes as needed for chest pain. Maximum of 3 doses. 08/06/20   End, Cristal Deer, MD  pregabalin (LYRICA) 100 MG capsule Take 100 mg by mouth daily. At 4 pm    [provider]  pregabalin (LYRICA) 50 MG capsule Take 25 mg by mouth at bedtime.    [provider]      Allergies    Losartan, Amlodipine, Cholestyramine, Cortisone, Prednisone, Tramadol, and Lisinopril    Review of Systems   Review of Systems  Neurological:  Positive for weakness.    Physical Exam Updated Vital Signs BP (!) 169/71   Pulse 100   Temp 98 F (36.7 C)  Resp 17   Ht 5\' 1"  (1.549 m)   Wt 72.6 kg   SpO2 96%   BMI 30.23 kg/m  Physical Exam Vitals and nursing note reviewed.  Constitutional:      General: She is not in acute distress.    Appearance: She is well-developed. She is not diaphoretic.  HENT:     Head: Normocephalic and atraumatic.  Eyes:     Pupils: Pupils are equal, round, and reactive to light.  Cardiovascular:     Rate and Rhythm: Normal rate and regular rhythm.     Heart sounds: No murmur heard.    No friction rub. No gallop.  Pulmonary:     Effort: Pulmonary effort is normal.     Breath sounds: No wheezing or rales.  Abdominal:     General: There is no distension.     Palpations: Abdomen is soft.     Tenderness: There is no abdominal  tenderness.  Musculoskeletal:        General: No tenderness.     Cervical back: Normal range of motion and neck supple.     Right lower leg: Edema present.     Comments: Right lower extremity greater than left  Skin:    General: Skin is warm and dry.  Neurological:     Mental Status: She is alert and oriented to person, place, and time.  Psychiatric:        Behavior: Behavior normal.     ED Results / Procedures / Treatments   Labs (all labs ordered are listed, but only abnormal results are displayed) Labs Reviewed  BASIC METABOLIC PANEL - Abnormal; Notable for the following components:      Result Value   Sodium 133 (*)    Chloride 97 (*)    All other components within normal limits  CBC - Abnormal; Notable for the following components:   Hemoglobin 11.4 (*)    HCT 35.9 (*)    All other components within normal limits  URINALYSIS, ROUTINE W REFLEX MICROSCOPIC - Abnormal; Notable for the following components:   Hgb urine dipstick TRACE (*)    Leukocytes,Ua TRACE (*)    All other components within normal limits  URINALYSIS, MICROSCOPIC (REFLEX) - Abnormal; Notable for the following components:   Bacteria, UA RARE (*)    All other components within normal limits  CULTURE, BLOOD (ROUTINE X 2)  CULTURE, BLOOD (ROUTINE X 2)  HEPATIC FUNCTION PANEL  LIPASE, BLOOD  LACTIC ACID, PLASMA  LACTIC ACID, PLASMA  CBG MONITORING, ED  TROPONIN I (HIGH SENSITIVITY)  TROPONIN I (HIGH SENSITIVITY)    EKG EKG Interpretation Date/Time:  Tuesday September 20 2022 15:28:20 EDT Ventricular Rate:  80 PR Interval:  157 QRS Duration:  92 QT Interval:  387 QTC Calculation: 447 R Axis:   46  Text Interpretation: Sinus rhythm Probable left atrial enlargement RSR' in V1 or V2, right VCD or RVH No significant change since last tracing Confirmed by Melene Plan [phone]) on 09/20/2022 3:42:25 PM  Radiology CT Angio Chest PE W and/or Wo Contrast  Result Date: 09/20/2022 CLINICAL DATA:  Chest pain and  shortness of breath. EXAM: CT ANGIOGRAPHY CHEST WITH CONTRAST TECHNIQUE: Multidetector CT imaging of the chest was performed using the standard protocol during bolus administration of intravenous contrast. Multiplanar CT image reconstructions and MIPs were obtained to evaluate the vascular anatomy. RADIATION DOSE REDUCTION: This exam was performed according to the departmental dose-optimization program which includes automated exposure control, adjustment of the mA and/or  kV according to patient size and/or use of iterative reconstruction technique. CONTRAST:  75mL OMNIPAQUE IOHEXOL 350 MG/ML SOLN COMPARISON:  April 20, 2018. FINDINGS: Cardiovascular: Satisfactory opacification of the pulmonary arteries to the segmental level. No evidence of pulmonary embolism. Normal heart size. No pericardial effusion. Mediastinum/Nodes: No enlarged mediastinal, hilar, or axillary lymph nodes. Thyroid gland, trachea, and esophagus demonstrate no significant findings. Lungs/Pleura: No pneumothorax or pleural effusion is noted. Stable nodule seen in right middle lobe which can be considered benign at this point with no further follow-up required. No acute pulmonary disease is noted. Upper Abdomen: No acute abnormality. Musculoskeletal: Status post kyphoplasty of 2 lower thoracic vertebral bodies. No acute osseous abnormality is noted. Review of the MIP images confirms the above findings. IMPRESSION: No definite evidence of pulmonary embolus. No acute abnormality seen in the chest. Aortic Atherosclerosis (ICD10-I70.0). Electronically Signed   By: Lupita Raider M.D.   On: 09/20/2022 18:38   DG Chest Port 1 View  Result Date: 09/20/2022 CLINICAL DATA:  Shortness of breath. EXAM: PORTABLE CHEST 1 VIEW COMPARISON:  Chest radiograph dated 06/18/2022. FINDINGS: Bibasilar linear atelectasis. No focal consolidation, pleural effusion, or pneumothorax. The cardiac silhouette is within normal limits. Atherosclerotic calcification of the  aortic arch. No acute osseous pathology. IMPRESSION: Bibasilar atelectasis. No focal consolidation. Electronically Signed   By: Elgie Collard M.D.   On: 09/20/2022 16:31    Procedures .Critical Care  Performed by: Melene Plan, DO Authorized by: Melene Plan, DO   Critical care provider statement:    Critical care time (minutes):  35   Critical care time was exclusive of:  Separately billable procedures and treating other patients   Critical care was time spent personally by me on the following activities:  Development of treatment plan with patient or surrogate, discussions with consultants, evaluation of patient's response to treatment, examination of patient, ordering and review of laboratory studies, ordering and review of radiographic studies, ordering and performing treatments and interventions, pulse oximetry, re-evaluation of patient's condition and review of old charts   Care discussed with: admitting provider       Medications Ordered in ED Medications  cefTRIAXone (ROCEPHIN) 2 g in sodium chloride 0.9 % 100 mL IVPB (2 g Intravenous New Bag/Given 09/20/22 2001)  azithromycin (ZITHROMAX) 500 mg in sodium chloride 0.9 % 250 mL IVPB (has no administration in time range)  ipratropium-albuterol (DUONEB) 0.5-2.5 (3) MG/3ML nebulizer solution 3 mL (3 mLs Nebulization Given 09/20/22 1654)  methylPREDNISolone sodium succinate (SOLU-MEDROL) 125 mg/2 mL injection 125 mg (125 mg Intravenous Given 09/20/22 1729)  iohexol (OMNIPAQUE) 350 MG/ML injection 75 mL (75 mLs Intravenous Contrast Given 09/20/22 1737)  ipratropium-albuterol (DUONEB) 0.5-2.5 (3) MG/3ML nebulizer solution 3 mL (3 mLs Nebulization Given 09/20/22 1949)    ED Course/ Medical Decision Making/ A&P                             Medical Decision Making Amount and/or Complexity of Data Reviewed Labs: ordered. Radiology: ordered.  Risk Prescription drug management. Decision regarding hospitalization.   89 yo F with a cc of not  feeling well.  This been going on for couple weeks.  She has had both urinary symptoms and a bit of a mild cough.  She just had knee surgery about 6 weeks ago.  That seems to be healing well.  On my record review the patient had been seen by her family physician, was started on 1 antibiotic  and then transition to ciprofloxacin.  She is just finished that course and feels like her urine still smells funny.  Patient is hypoxic on arrival.  Oxygen saturation in the mid 80s at rest.  Placed on 2 L with improvement.  Patient does have some wheezing on my exam.  Will give 1 DuoNeb and steroids.  She told me that she has in the past been diagnosed with bronchitis and required nebulized treatment.  Patient's breathing has gotten a bit better after a DuoNeb and Solu-Medrol.  Her troponins negative, chest x-ray independently interpreted by me without focal infiltrate.  CT angiogram of the chest was performed negative for pulmonary embolism or obvious occult pneumonia.  She continues to require oxygen.  She would prefer to go home at this time.  I did have her walk around the department and she was very symptomatic without it and was hypoxic into the upper 80s.  After some continued discussion at bedside the patient is willing to come into the hospital.  Will discuss with medicine.  The patients results and plan were reviewed and discussed.   Any x-rays performed were independently reviewed by myself.   Differential diagnosis were considered with the presenting HPI.  Medications  cefTRIAXone (ROCEPHIN) 2 g in sodium chloride 0.9 % 100 mL IVPB (2 g Intravenous New Bag/Given 09/20/22 2001)  azithromycin (ZITHROMAX) 500 mg in sodium chloride 0.9 % 250 mL IVPB (has no administration in time range)  ipratropium-albuterol (DUONEB) 0.5-2.5 (3) MG/3ML nebulizer solution 3 mL (3 mLs Nebulization Given 09/20/22 1654)  methylPREDNISolone sodium succinate (SOLU-MEDROL) 125 mg/2 mL injection 125 mg (125 mg Intravenous Given  09/20/22 1729)  iohexol (OMNIPAQUE) 350 MG/ML injection 75 mL (75 mLs Intravenous Contrast Given 09/20/22 1737)  ipratropium-albuterol (DUONEB) 0.5-2.5 (3) MG/3ML nebulizer solution 3 mL (3 mLs Nebulization Given 09/20/22 1949)    Vitals:   09/20/22 1645 09/20/22 1915 09/20/22 1949 09/20/22 2000  BP: (!) 149/62   (!) 169/71  Pulse: 76   100  Resp: (!) 22   17  Temp:  98 F (36.7 C)    TempSrc:      SpO2: 93%  97% 96%  Weight:      Height:        Final diagnoses:  Acute respiratory failure with hypoxia (HCC)    Admission/ observation were discussed with the admitting physician, patient and/or family and they are comfortable with the plan.          Final Clinical Impression(s) / ED Diagnoses Final diagnoses:  Acute respiratory failure with hypoxia [facility])    Rx / DC Orders ED Discharge Orders     None         Melene Plan, DO 09/20/22 2005

## 2022-09-20 NOTE — H&P (Signed)
[place]   PATIENT NAME: Rebecca Stanley    MR#:  [id number]  DATE OF BIRTH:  Jul 28, 1930  DATE OF ADMISSION:  09/20/2022  PRIMARY CARE PHYSICIAN: Gordan Payment., MD   Patient is coming from: Home  REQUESTING/REFERRING PHYSICIAN: Melene Plan, DO [facility])  CHIEF COMPLAINT:   Chief Complaint  Patient presents with   Weakness    HISTORY OF PRESENT ILLNESS:  PAITEN LOWY is a 89 y.o. Caucasian female with medical history significant for osteoarthritis status post right TKA about 6 weeks ago at [facility], grade 1 diastolic function, hypertension, dyslipidemia, diverticulosis, mitral valve prolapse and palpitations, who presented to the emergency room with persistent intermittent wheezing over the last 4 weeks with associated dry cough as well as dyspnea worsening today with associated hypoxemia.  She is a non-smoker but her dad was a heavy smoker.  Asked if she had similar episode for which she was given bronchodilator therapy and steroid therapy with improvement.  She was not diagnosed with asthma or chronic bronchitis or emphysema before.  She gained 8 pounds after her surgery.  She admitted to orthopnea sleeping in a recliner however this has been chronic.  She has lower extremity edema which has been worsening and admits to dyspnea on exertion.  No paroxysmal nocturnal dyspnea.  No chest pain or palpitations.  She admits to dysuria without urinary frequency or urgency or flank pain.  She was treated for UTI couple weeks after surgery.  She received a course of Augmentin for 8 days prior to that followed by Cipro for 5 days.  No nausea or vomiting or abdominal pain.  She has been constipated.  No melena or bright red blood rectum.  No other bleeding diathesis.  ED Course: When she came to the ED, BP was 159/64 and later 169/71 with pulse currently 93 to 96% on 2 L of O2 by nasal cannula with otherwise normal vital signs and occasional tachypnea of 21-22.  Labs revealed negative UA  and mild hyponatremia and hypokalemia with normal CMP otherwise.  High sensitive troponin was 8 and lactic acid 0.8.  CBC showed mild anemia. EKG as reviewed by me : EKG showed normal sinus rhythm with a rate of 80 with probable left atrial enlargement Imaging: Portable chest x-ray showed bibasal atelectasis. CTA of the chest revealed no evidence for PE or acute abnormality.  It showed aortic atherosclerosis.  The patient was given IV Rocephin and Zithromax, 2 DuoNebs and 125 mg of IV Solu-Medrol.  She will be admitted to a medical observation bed for further evaluation and management.  PAST MEDICAL HISTORY:   Past Medical History:  Diagnosis Date   (HFpEF) heart failure with preserved ejection fraction (HCC)    a. 09/2020 Echo: EF 55-60%, no rwma, Gr1DD, nl RV size/fxn. RVSP 48.53mmHg. Mild LAE. Mild MR.   Allergy    Arthritis    Blood transfusion    Cancer [facility])    Colitis    Colon polyp    Depression    Diverticulosis    Heart murmur    History of cardiac catheterization    a. 05/2016 Cath ([place]): Nl cors. EF 65%. LVEDP .   History of stress test    a. 11/2018 MV: EF 55-65%, no ischemia/infarct.   Hyperlipidemia    Hypertension    Mitral valve prolapse    a. 09/2020 Echo: Mild MR.   Osteoporosis    Palpitations    a. 07/2020 reported  tachycardia-->08/2020 Zio: RSR 78 (49-143). Rare PACs/PVCs. 3 atrial runs up to 10 beats, max 162 bpm. Triggered events = sinus rhythm.   Thyroid disease     PAST SURGICAL HISTORY:   Past Surgical History:  Procedure Laterality Date   BELPHAROPTOSIS REPAIR     bladder tack     CATARACT EXTRACTION     COLONOSCOPY     DILATION AND CURETTAGE, DIAGNOSTIC / THERAPEUTIC     gluteous medius tendon rupture     done at [facility]   heel tendon repair     HIP ARTHROSCOPY W/ LABRAL REPAIR     kyploplasty     T12, L1, L2, L3, done at [facility]   PARTIAL HYSTERECTOMY      SOCIAL HISTORY:   Social History   Tobacco Use   Smoking status: Never    Smokeless tobacco: Never  Substance Use Topics   Alcohol use: No    FAMILY HISTORY:   Family History  Problem Relation Age of Onset   Heart disease Mother    Heart disease Other        aunt   Diabetes Other        grandmother   Breast cancer Other        aunt   Ovarian cancer Other        cousin   Rheum arthritis Father    Colon cancer Neg Hx     DRUG ALLERGIES:   Allergies  Allergen Reactions   Losartan Swelling    Facial swelling   Amlodipine Swelling   Cholestyramine Other (See Comments)   Cortisone Other (See Comments)     high blood pressure    Prednisone Other (See Comments)   Tramadol     Other reaction(s): Other (See Comments), Unknown Panic attacks with other meds that she is taking     Lisinopril Cough    REVIEW OF SYSTEMS:   ROS As per history of present illness. All pertinent systems were reviewed above. Constitutional, HEENT, cardiovascular, respiratory, GI, GU, musculoskeletal, neuro, psychiatric, endocrine, integumentary and hematologic systems were reviewed and are otherwise negative/unremarkable except for positive findings mentioned above in the HPI.   MEDICATIONS AT HOME:   Prior to Admission medications   Medication Sig Start Date End Date Taking? Authorizing Provider  potassium chloride SA (KLOR-CON M) 10 MEQ tablet Take 1 tablet (10 mEq total) by mouth daily as needed. When you take lasix 07/07/22 10/05/22  Furth, Cadence H, PA-C  amitriptyline (ELAVIL) 25 MG tablet Take 25 mg by mouth at bedtime.    [provider]  Cholecalciferol (VITAMIN D PO) Take 1 tablet by mouth daily.    [provider]  clonazePAM (KLONOPIN) 0.5 MG tablet Take 0.5 mg by mouth 2 (two) times daily as needed for anxiety.    [provider]  DULoxetine (CYMBALTA) 20 MG capsule Take 40 mg by mouth 2 (two) times daily. 05/13/20   [provider]  famotidine (PEPCID) 20 MG tablet Take 1 tablet (20 mg total) by mouth 2 (two) times daily as  needed for heartburn or indigestion. 06/12/19   End, Cristal Deer, MD  furosemide (LASIX) 20 MG tablet Take 1 tablet (20 mg total) by mouth as needed (weight gain or swelling). 01/20/21   End, Cristal Deer, MD  GEMTESA 75 MG TABS Take 1 tablet by mouth daily.    [provider]  hydrALAZINE (APRESOLINE) 10 MG tablet Take 10 mg by mouth 3 (three) times daily.    [provider]  hydrochlorothiazide (HYDRODIURIL) 25 MG tablet Take 1 tablet by mouth in the morning. 01/28/21   [provider]  levothyroxine (SYNTHROID, LEVOTHROID) 25 MCG tablet Take 62.5 mcg by mouth daily.    [provider]  metoprolol succinate (TOPROL-XL) 25 MG 24 hr tablet Take 12.5 mg by mouth daily. Family doctor wants to change to 12.5mg  instead of 25mg  06/22/22   [provider]  nitroGLYCERIN (NITROSTAT) 0.4 MG SL tablet Place 1 tablet (0.4 mg total) under the tongue every 5 (five) minutes as needed for chest pain. Maximum of 3 doses. 08/06/20   End, Cristal Deer, MD  pregabalin (LYRICA) 100 MG capsule Take 100 mg by mouth daily. At 4 pm    [provider]  pregabalin (LYRICA) 50 MG capsule Take 25 mg by mouth at bedtime.    [provider]      VITAL SIGNS:  Blood pressure (!) 168/74, pulse 95, temperature 97.7 F (36.5 C), temperature source Oral, resp. rate 20, height 5\' 1"  (1.549 m), weight 72.6 kg, SpO2 99 %.  PHYSICAL EXAMINATION:  Physical Exam  GENERAL:  89 y.o.-year-old Caucasian female patient lying in the bed with no acute distress however with mild conversational dyspnea.  EYES: Pupils equal, round, reactive to light and accommodation. No scleral icterus. Extraocular muscles intact.  HEENT: Head atraumatic, normocephalic. Oropharynx and nasopharynx clear.  NECK:  Supple, no jugular venous distention. No thyroid enlargement, no tenderness.  LUNGS: Diminished bibasilar breath sounds with prolonged expiratory phase without current wheezing.  No use of  accessory muscles of respiration.  CARDIOVASCULAR: Regular rate and rhythm, S1, S2 normal. No murmurs, rubs, or gallops.  ABDOMEN: Soft, nondistended, nontender. Bowel sounds present. No organomegaly or mass.  EXTREMITIES: Bilateral lower extremity 2+ pitting edema more on the right than the left with no cyanosis, or clubbing.  NEUROLOGIC: Cranial nerves II through XII are intact. Muscle strength 5/5 in all extremities. Sensation intact. Gait not checked.  PSYCHIATRIC: The patient is alert and oriented x 3.  Normal affect and good eye contact. SKIN: No obvious rash, lesion, or ulcer.   LABORATORY PANEL:   CBC Recent Labs  Lab 09/20/22 1530  WBC 7.8  HGB 11.4*  HCT 35.9*  PLT 383   ------------------------------------------------------------------------------------------------------------------  Chemistries  Recent Labs  Lab 09/20/22 1530 09/20/22 1552  NA 133*  --   K 3.9  --   CL 97*  --   CO2 26  --   GLUCOSE 91  --   BUN 18  --   CREATININE 0.71  --   CALCIUM 9.2  --   AST  --  25  ALT  --  18  ALKPHOS  --  58  BILITOT  --  0.7   ------------------------------------------------------------------------------------------------------------------  Cardiac Enzymes No results for input(s): "TROPONINI" in the last 168 hours. ------------------------------------------------------------------------------------------------------------------  RADIOLOGY:  CT Angio Chest PE W and/or Wo Contrast  Result Date: 09/20/2022 CLINICAL DATA:  Chest pain and shortness of breath. EXAM: CT ANGIOGRAPHY CHEST WITH CONTRAST TECHNIQUE: Multidetector CT imaging of the chest was performed using the standard protocol during bolus administration of intravenous contrast. Multiplanar CT image reconstructions and MIPs were obtained to evaluate the vascular anatomy. RADIATION DOSE REDUCTION: This exam was performed according to the departmental dose-optimization program which includes automated  exposure control, adjustment of the mA and/or kV according to patient size and/or use of iterative reconstruction technique. CONTRAST:  75mL OMNIPAQUE IOHEXOL 350 MG/ML SOLN COMPARISON:  April 20, 2018. FINDINGS: Cardiovascular: Satisfactory opacification of  the pulmonary arteries to the segmental level. No evidence of pulmonary embolism. Normal heart size. No pericardial effusion. Mediastinum/Nodes: No enlarged mediastinal, hilar, or axillary lymph nodes. Thyroid gland, trachea, and esophagus demonstrate no significant findings. Lungs/Pleura: No pneumothorax or pleural effusion is noted. Stable nodule seen in right middle lobe which can be considered benign at this point with no further follow-up required. No acute pulmonary disease is noted. Upper Abdomen: No acute abnormality. Musculoskeletal: Status post kyphoplasty of 2 lower thoracic vertebral bodies. No acute osseous abnormality is noted. Review of the MIP images confirms the above findings. IMPRESSION: No definite evidence of pulmonary embolus. No acute abnormality seen in the chest. Aortic Atherosclerosis (ICD10-I70.0). Electronically Signed   By: Lupita Raider M.D.   On: 09/20/2022 18:38   DG Chest Port 1 View  Result Date: 09/20/2022 CLINICAL DATA:  Shortness of breath. EXAM: PORTABLE CHEST 1 VIEW COMPARISON:  Chest radiograph dated 06/18/2022. FINDINGS: Bibasilar linear atelectasis. No focal consolidation, pleural effusion, or pneumothorax. The cardiac silhouette is within normal limits. Atherosclerotic calcification of the aortic arch. No acute osseous pathology. IMPRESSION: Bibasilar atelectasis. No focal consolidation. Electronically Signed   By: Elgie Collard M.D.   On: 09/20/2022 16:31      IMPRESSION AND PLAN:  Assessment and Plan: * Acute respiratory failure with hypoxia (HCC) - This could be related to acute bronchitis with bronchospasm. - Differential diagnosis would include developing acute on chronic diastolic CHF especially  given uncontrolled hypertension and recent weight gain. - The patient will be admitted to a medical observation bed. - We will continue steroid therapy with IV Solu-Medrol and bronchodilator therapy with DuoNebs. - Mucolytic therapy will be provided. - I will hold off further antibiotic therapy for now. - Will add BNP to her labs and will manage with IV diuretic therapy with Lasix as mentioned below. Her most recent 2D echo on 10/05/2021 revealed EF of 60 to 65% with grade 1 diastolic dysfunction.  Acute on chronic diastolic CHF (congestive heart failure) (HCC) - We will place her on IV diuretic therapy with Lasix. - We will follow I's and O's and daily weights. - BNP was ordered as mentioned above.  Uncontrolled hypertension - We will continue her antihypertensives and optimize BP control.  She will be placed on as needed IV labetalol.  Hypothyroidism - We will continue Synthroid and check TSH.  Peripheral neuropathy - We will continue Lyrica.   DVT prophylaxis: Lovenox. Advanced Care Planning:  Code Status: The patient is DNR and DNI.  This was discussed with her. Family Communication:  The plan of care was discussed in details with the patient (and family). I answered all questions. The patient agreed to proceed with the above mentioned plan. Further management will depend upon hospital course. Disposition Plan: Back to previous home environment Consults called: none. All the records are reviewed and case discussed with ED provider.  Status is: Observation  I certify that at the time of admission, it is my clinical judgment that the patient will require hospital care extending less than 2 midnights.                            Dispo: The patient is from: Home              Anticipated d/c is to: Home              Patient currently is not medically stable to d/c.  Difficult to place patient: No  Hannah Beat M.D on 09/21/2022 at 12:34 AM  [facility]   From  7 PM-7 AM, contact night-coverage [email or web address]  CC: Primary care physician; Gordan Payment., MD

## 2022-09-20 NOTE — ED Notes (Signed)
RT ambulated with pt. Pt's O2 saturation fluctuated between 88-92%.

## 2022-09-20 NOTE — ED Triage Notes (Addendum)
Pt reports feeling very weak "I feel out of it" that has progressed over the last week. She also reports chest pain, shortness of breath (onset today around 1pm), also reports feeling shaky and says she has gained 9lbs in one month. She had a right knee replacement 6 weeks ago at [facility]. She also was just diagnosed with a UTI (positive for Pseudomonas aeruginosa). She finished her 5 day course of Cipro and an 8 day course of Augmentin.

## 2022-09-21 ENCOUNTER — Observation Stay (HOSPITAL_BASED_OUTPATIENT_CLINIC_OR_DEPARTMENT_OTHER): Payer: Medicare Other

## 2022-09-21 DIAGNOSIS — J9601 Acute respiratory failure with hypoxia: Secondary | ICD-10-CM | POA: Diagnosis present

## 2022-09-21 DIAGNOSIS — I5033 Acute on chronic diastolic (congestive) heart failure: Secondary | ICD-10-CM | POA: Diagnosis present

## 2022-09-21 DIAGNOSIS — R079 Chest pain, unspecified: Secondary | ICD-10-CM

## 2022-09-21 DIAGNOSIS — E785 Hyperlipidemia, unspecified: Secondary | ICD-10-CM | POA: Diagnosis present

## 2022-09-21 DIAGNOSIS — E039 Hypothyroidism, unspecified: Secondary | ICD-10-CM | POA: Diagnosis present

## 2022-09-21 DIAGNOSIS — J209 Acute bronchitis, unspecified: Secondary | ICD-10-CM

## 2022-09-21 DIAGNOSIS — Z90711 Acquired absence of uterus with remaining cervical stump: Secondary | ICD-10-CM | POA: Diagnosis not present

## 2022-09-21 DIAGNOSIS — I110 Hypertensive heart disease with heart failure: Secondary | ICD-10-CM | POA: Diagnosis present

## 2022-09-21 DIAGNOSIS — Z66 Do not resuscitate: Secondary | ICD-10-CM | POA: Diagnosis present

## 2022-09-21 DIAGNOSIS — F410 Panic disorder [episodic paroxysmal anxiety] without agoraphobia: Secondary | ICD-10-CM | POA: Diagnosis present

## 2022-09-21 DIAGNOSIS — E872 Acidosis, unspecified: Secondary | ICD-10-CM | POA: Diagnosis present

## 2022-09-21 DIAGNOSIS — I341 Nonrheumatic mitral (valve) prolapse: Secondary | ICD-10-CM | POA: Diagnosis present

## 2022-09-21 DIAGNOSIS — G629 Polyneuropathy, unspecified: Secondary | ICD-10-CM

## 2022-09-21 DIAGNOSIS — L89152 Pressure ulcer of sacral region, stage 2: Secondary | ICD-10-CM | POA: Diagnosis present

## 2022-09-21 DIAGNOSIS — Z79899 Other long term (current) drug therapy: Secondary | ICD-10-CM | POA: Diagnosis not present

## 2022-09-21 DIAGNOSIS — D649 Anemia, unspecified: Secondary | ICD-10-CM | POA: Diagnosis present

## 2022-09-21 DIAGNOSIS — Z8041 Family history of malignant neoplasm of ovary: Secondary | ICD-10-CM | POA: Diagnosis not present

## 2022-09-21 DIAGNOSIS — Z833 Family history of diabetes mellitus: Secondary | ICD-10-CM | POA: Diagnosis not present

## 2022-09-21 DIAGNOSIS — Z8261 Family history of arthritis: Secondary | ICD-10-CM | POA: Diagnosis not present

## 2022-09-21 DIAGNOSIS — E876 Hypokalemia: Secondary | ICD-10-CM | POA: Diagnosis present

## 2022-09-21 DIAGNOSIS — Z79890 Hormone replacement therapy (postmenopausal): Secondary | ICD-10-CM | POA: Diagnosis not present

## 2022-09-21 DIAGNOSIS — J9811 Atelectasis: Secondary | ICD-10-CM | POA: Diagnosis present

## 2022-09-21 DIAGNOSIS — Z803 Family history of malignant neoplasm of breast: Secondary | ICD-10-CM | POA: Diagnosis not present

## 2022-09-21 DIAGNOSIS — Z96651 Presence of right artificial knee joint: Secondary | ICD-10-CM | POA: Diagnosis present

## 2022-09-21 DIAGNOSIS — Z86010 Personal history of colonic polyps: Secondary | ICD-10-CM | POA: Diagnosis not present

## 2022-09-21 LAB — TROPONIN I (HIGH SENSITIVITY)
Troponin I (High Sensitivity): 8 ng/L (ref <18)
Troponin I (High Sensitivity): 9 ng/L (ref <18)
Troponin I (High Sensitivity): 8 ng/L (ref <18)
Troponin I (High Sensitivity): 9 ng/L (ref <18)

## 2022-09-21 LAB — BASIC METABOLIC PANEL
Anion gap: 13 (ref 5–15)
BUN: 18 mg/dL (ref 8–23)
CO2: 28 mmol/L (ref 22–32)
Calcium: 9.2 mg/dL (ref 8.9–10.3)
Chloride: 94 mmol/L — ABNORMAL LOW (ref 98–111)
Creatinine, Ser: 0.66 mg/dL (ref 0.44–1.00)
GFR, Estimated: >60 mL/min (ref >60)
Glucose, Bld: 154 mg/dL — ABNORMAL HIGH (ref 70–99)
Potassium: 3 mmol/L — ABNORMAL LOW (ref 3.5–5.1)
Sodium: 135 mmol/L (ref 135–145)

## 2022-09-21 LAB — ECHOCARDIOGRAM COMPLETE
AR max vel: 2.12 cm2
AV Area VTI: 2.51 cm2
AV Area mean vel: 2.03 cm2
AV Mean grad: 7 mmHg
AV Peak grad: 12.4 mmHg
Ao pk vel: 1.76 m/s
Area-P 1/2: 4.49 cm2
Calc EF: 66.8 %
Height: 61 in
MV VTI: 3.53 cm2
S' Lateral: 1.7 cm
Single Plane A2C EF: 67 %
Single Plane A4C EF: 64 %
Weight: 2560 oz

## 2022-09-21 LAB — BRAIN NATRIURETIC PEPTIDE: B Natriuretic Peptide: 75.7 pg/mL (ref 0.0–100.0)

## 2022-09-21 LAB — CBC
HCT: 37 % (ref 36.0–46.0)
Hemoglobin: 11.7 g/dL — ABNORMAL LOW (ref 12.0–15.0)
MCH: 27.5 pg (ref 26.0–34.0)
MCHC: 31.6 g/dL (ref 30.0–36.0)
MCV: 86.9 fL (ref 80.0–100.0)
Platelets: 411 10*3/uL — ABNORMAL HIGH (ref 150–400)
RBC: 4.26 MIL/uL (ref 3.87–5.11)
RDW: 14 % (ref 11.5–15.5)
WBC: 6.3 10*3/uL (ref 4.0–10.5)
nRBC: 0 % (ref 0.0–0.2)

## 2022-09-21 LAB — VITAMIN B12: Vitamin B-12: 384 pg/mL (ref 180–914)

## 2022-09-21 LAB — TSH: TSH: 0.631 u[IU]/mL (ref 0.350–4.500)

## 2022-09-21 LAB — MAGNESIUM: Magnesium: 2 mg/dL (ref 1.7–2.4)

## 2022-09-21 LAB — LACTIC ACID, PLASMA: Lactic Acid, Venous: 3.5 mmol/L (ref 0.5–1.9)

## 2022-09-21 MED ORDER — FUROSEMIDE 10 MG/ML IJ SOLN
20.0000 mg | Freq: Every day | INTRAMUSCULAR | Status: DC
  Administered 2022-09-22 – 2022-09-23 (×2): 20 mg via INTRAVENOUS
  Filled 2022-09-21 (×2): qty 2

## 2022-09-21 MED ORDER — HYDRALAZINE HCL 10 MG PO TABS
10.0000 mg | ORAL_TABLET | Freq: Three times a day (TID) | ORAL | Status: DC
  Administered 2022-09-21 – 2022-09-23 (×6): 10 mg via ORAL
  Filled 2022-09-21 (×6): qty 1

## 2022-09-21 MED ORDER — IPRATROPIUM-ALBUTEROL 0.5-2.5 (3) MG/3ML IN SOLN
3.0000 mL | Freq: Three times a day (TID) | RESPIRATORY_TRACT | Status: DC
  Administered 2022-09-21 – 2022-09-23 (×5): 3 mL via RESPIRATORY_TRACT
  Filled 2022-09-21 (×5): qty 3

## 2022-09-21 MED ORDER — LACTATED RINGERS IV BOLUS
250.0000 mL | Freq: Once | INTRAVENOUS | Status: AC
  Administered 2022-09-21: 250 mL via INTRAVENOUS

## 2022-09-21 MED ORDER — FUROSEMIDE 10 MG/ML IJ SOLN
20.0000 mg | Freq: Once | INTRAMUSCULAR | Status: AC
  Administered 2022-09-21: 20 mg via INTRAVENOUS
  Filled 2022-09-21: qty 2

## 2022-09-21 MED ORDER — FUROSEMIDE 10 MG/ML IJ SOLN
40.0000 mg | Freq: Two times a day (BID) | INTRAMUSCULAR | Status: DC
  Administered 2022-09-21: 40 mg via INTRAVENOUS
  Filled 2022-09-21: qty 4

## 2022-09-21 MED ORDER — ALBUTEROL SULFATE (2.5 MG/3ML) 0.083% IN NEBU: 2.5000 mg | INHALATION_SOLUTION | Freq: Four times a day (QID) | RESPIRATORY_TRACT | Status: DC | PRN

## 2022-09-21 MED ORDER — VITAMIN B-12 1000 MCG PO TABS
1000.0000 ug | ORAL_TABLET | Freq: Every day | ORAL | Status: DC
  Administered 2022-09-21 – 2022-09-23 (×3): 1000 ug via ORAL
  Filled 2022-09-21 (×3): qty 1

## 2022-09-21 MED ORDER — METHYLPREDNISOLONE SODIUM SUCC 125 MG IJ SOLR
40.0000 mg | Freq: Two times a day (BID) | INTRAMUSCULAR | Status: DC
  Administered 2022-09-21 (×2): 40 mg via INTRAVENOUS
  Filled 2022-09-21 (×2): qty 2

## 2022-09-21 MED ORDER — POTASSIUM CHLORIDE CRYS ER 20 MEQ PO TBCR
40.0000 meq | EXTENDED_RELEASE_TABLET | Freq: Once | ORAL | Status: AC
  Administered 2022-09-21: 40 meq via ORAL
  Filled 2022-09-21: qty 2

## 2022-09-21 MED ORDER — MORPHINE SULFATE (PF) 2 MG/ML IV SOLN
0.5000 mg | INTRAVENOUS | Status: DC | PRN
  Administered 2022-09-21: 0.5 mg via INTRAVENOUS
  Filled 2022-09-21: qty 1

## 2022-09-21 MED ORDER — BUDESONIDE 0.25 MG/2ML IN SUSP
0.2500 mg | Freq: Two times a day (BID) | RESPIRATORY_TRACT | Status: DC
  Administered 2022-09-21 – 2022-09-23 (×5): 0.25 mg via RESPIRATORY_TRACT
  Filled 2022-09-21 (×5): qty 2

## 2022-09-21 MED ORDER — LABETALOL HCL 5 MG/ML IV SOLN
20.0000 mg | INTRAVENOUS | Status: DC | PRN
  Filled 2022-09-21: qty 4

## 2022-09-21 NOTE — Assessment & Plan Note (Signed)
-   We will continue her antihypertensives and optimize BP control.  She will be placed on as needed IV labetalol.

## 2022-09-21 NOTE — Assessment & Plan Note (Signed)
-   We will place her on IV diuretic therapy with Lasix. - We will follow I's and O's and daily weights. - BNP was ordered as mentioned above.

## 2022-09-21 NOTE — Assessment & Plan Note (Addendum)
-   This could be related to acute bronchitis with bronchospasm. - Differential diagnosis would include developing acute on chronic diastolic CHF especially given uncontrolled hypertension and recent weight gain. - The patient will be admitted to a medical observation bed. - We will continue steroid therapy with IV Solu-Medrol and bronchodilator therapy with DuoNebs. - Mucolytic therapy will be provided. - I will hold off further antibiotic therapy for now. - Will add BNP to her labs and will manage with IV diuretic therapy with Lasix as mentioned below. Her most recent 2D echo on 10/05/2021 revealed EF of 60 to 65% with grade 1 diastolic dysfunction.

## 2022-09-21 NOTE — Progress Notes (Signed)
PROGRESS NOTE    Rebecca Stanley  ZOX:[id number] DOB: 05-Aug-1930 DOA: 09/20/2022 PCP: Gordan Payment., MD   Brief Narrative: 89 year old with past medical history significant for osteoarthritis status post right TKA about 6 weeks prior to admission at [facility], grade 1 diastolic dysfunction, hypertension, dyslipidemia, diverticulosis, mitral valve prolapse, palpitation presents to the ED complaining of shortness of breath, dry cough, wheezing over the last 4 weeks worse recently.  She reports 8 pounds weight gain after surgery.  She reports orthopnea. Evaluation in the ED she required 2 L to keep oxygen sat 92 to 96% chest x-ray showed by basilar atelectasis.  CTA chest negative for PE.  Assessment & Plan:   Principal Problem:   Acute respiratory failure with hypoxia (HCC) Active Problems:   Acute on chronic diastolic CHF (congestive heart failure) (HCC)   Uncontrolled hypertension   Hypothyroidism   Peripheral neuropathy   Acute bronchitis with bronchospasm   1-Acute respiratory failure with hypoxia: Acute bronchitis with bronchospasm -Continue with IV steroids.  -Schedule Duo-neb -Will add Pulmicort.  -BNP , troponin not significantly elevated, but with weight gain, will give a dose of IV lasix.  She had some chest tightness this am, IV lasix ordered, nebulizer, received nitroglycerin. IV morphine. Per Son, she has history of anxiety and panic attack.   Hypokalemia: Replete orally.   Lactic acidosis. Suspect related to respiratory issues.  Afebrile, no leukocytosis.   Acute on chronic diastolic heart failure: BNP not significantly elevated.  Troponin negative.  Crackles on lung exam, weight gain. Will give IV lasix.   Uncontrolled hypertension: On metoprolol.  PRN Hydralazine If continue to be elevated could try Norvasc.   Hypothyroidism: Continue with Synthroid.   Peripheral neuropathy: On Lyrica.   Anxiety: On Klonopin BID PRN   Pressure Injury 09/20/22 Sacrum  Stage 2 -  Partial thickness loss of dermis presenting as a shallow open injury with a red, pink wound bed without slough. (Active)  09/20/22 2330  Location: Sacrum  Location Orientation:   Staging: Stage 2 -  Partial thickness loss of dermis presenting as a shallow open injury with a red, pink wound bed without slough.  Wound Description (Comments):   Present on Admission: Yes  Dressing Type Foam - Lift dressing to assess site every shift 09/20/22 2336         Estimated body mass index is 30.23 kg/m as calculated from the following:   Height as of this encounter: 5\' 1"  (1.549 m).   Weight as of this encounter: 72.6 kg.   DVT prophylaxis: Lovenox Code Status: DNR Family Communication: Son at bedside Disposition Plan:  Status is: Observation     Consultants:    Procedures:    Antimicrobials:  None  Subjective: Seen this am, complaining of chest tightness, not feeling well.    Objective: Vitals:   09/21/22 0350 09/21/22 0457 09/21/22 0720 09/21/22 0736  BP: (!) 153/57 (!) 141/67 (!) 157/64   Pulse: 89 85 95   Resp: 17  16   Temp: 97.7 F (36.5 C)  (!) 97.5 F (36.4 C)   TempSrc: Oral     SpO2: 96% 95% 99% 99%  Weight:      Height:        Intake/Output Summary (Last 24 hours) at 09/21/2022 0808 Last data filed at 09/21/2022 0500 Gross per 24 hour  Intake 478.36 ml  Output --  Net 478.36 ml   Filed Weights   09/20/22 1526  Weight: 72.6 kg  Examination:  General exam: Appears calm and comfortable  Respiratory system: BL crackles.  Cardiovascular system: S1 & S2 heard, RRR. Gastrointestinal system: Abdomen is nondistended, soft and nontender. No organomegaly or masses felt. Normal bowel sounds heard. Central nervous system: Alert and oriented. No focal neurological deficits. Extremities: Symmetric 5 x 5 power.    Data Reviewed: I have personally reviewed following labs and imaging studies  CBC: Recent Labs  Lab 09/20/22 1530  09/21/22 0631  WBC 7.8 6.3  HGB 11.4* 11.7*  HCT 35.9* 37.0  MCV 87.3 86.9  PLT 383 411*   Basic Metabolic Panel: Recent Labs  Lab 09/20/22 1530 09/21/22 0631  NA 133* 135  K 3.9 3.0*  CL 97* 94*  CO2 26 28  GLUCOSE 91 154*  BUN 18 18  CREATININE 0.71 0.66  CALCIUM 9.2 9.2   GFR: Estimated Creatinine Clearance: 43.4 mL/min (by C-G formula based on SCr of 0.66 mg/dL). Liver Function Tests: Recent Labs  Lab 09/20/22 1552  AST 25  ALT 18  ALKPHOS 58  BILITOT 0.7  PROT 7.5  ALBUMIN 4.0   Recent Labs  Lab 09/20/22 1552  LIPASE 34   No results for input(s): "AMMONIA" in the last 168 hours. Coagulation Profile: No results for input(s): "INR", "PROTIME" in the last 168 hours. Cardiac Enzymes: No results for input(s): "CKTOTAL", "CKMB", "CKMBINDEX", "TROPONINI" in the last 168 hours. BNP (last 3 results) No results for input(s): "PROBNP" in the last 8760 hours. HbA1C: No results for input(s): "HGBA1C" in the last 72 hours. CBG: Recent Labs  Lab 09/20/22 1533  GLUCAP 84   Lipid Profile: No results for input(s): "CHOL", "HDL", "LDLCALC", "TRIG", "CHOLHDL", "LDLDIRECT" in the last 72 hours. Thyroid Function Tests: Recent Labs    09/21/22 0357  TSH 0.631   Anemia Panel: No results for input(s): "VITAMINB12", "FOLATE", "FERRITIN", "TIBC", "IRON", "RETICCTPCT" in the last 72 hours. Sepsis Labs: Recent Labs  Lab 09/20/22 1552 09/21/22 0357  LATICACIDVEN 0.8 3.5*    Recent Results (from the past 240 hour(s))  Blood culture (routine x 2)     Status: None (Preliminary result)   Collection Time: 09/20/22  5:00 PM   Specimen: BLOOD  Result Value Ref Range Status   Specimen Description   Final    BLOOD BLOOD RIGHT FOREARM Performed at [facility], [address] [place], [place] [address]    Special Requests   Final    BOTTLES DRAWN AEROBIC AND ANAEROBIC Blood Culture adequate volume Performed at [facility], [address]  [place], [place] [address]    Culture   Final    NO GROWTH < 12 HOURS Performed at [facility], [address], [place], [place] [address]    Report Status PENDING  Incomplete  Blood culture (routine x 2)     Status: None (Preliminary result)   Collection Time: 09/20/22  5:16 PM   Specimen: BLOOD  Result Value Ref Range Status   Specimen Description   Final    BLOOD LEFT ANTECUBITAL Performed at [facility], [address] [place], [place] [address]    Special Requests   Final    BOTTLES DRAWN AEROBIC AND ANAEROBIC Blood Culture adequate volume Performed at [facility], [address] [place], [place] [address]    Culture   Final    NO GROWTH < 12 HOURS Performed at [facility], [address], [place], [place]  [address]    Report Status PENDING  Incomplete         Radiology Studies: CT Angio Chest PE W and/or Wo Contrast  Result Date: 09/20/2022 CLINICAL DATA:  Chest pain and shortness of breath. EXAM: CT ANGIOGRAPHY CHEST WITH CONTRAST TECHNIQUE: Multidetector CT imaging of the chest was performed using the standard protocol during bolus administration of intravenous contrast. Multiplanar CT image reconstructions and MIPs were obtained to evaluate the vascular anatomy. RADIATION DOSE REDUCTION: This exam was performed according to the departmental dose-optimization program which includes automated exposure control, adjustment of the mA and/or kV according to patient size and/or use of iterative reconstruction technique. CONTRAST:  75mL OMNIPAQUE IOHEXOL 350 MG/ML SOLN COMPARISON:  April 20, 2018. FINDINGS: Cardiovascular: Satisfactory opacification of the pulmonary arteries to the segmental level. No evidence of pulmonary embolism. Normal heart size. No pericardial effusion. Mediastinum/Nodes: No enlarged mediastinal, hilar, or axillary lymph nodes. Thyroid gland, trachea, and esophagus demonstrate no significant findings. Lungs/Pleura: No  pneumothorax or pleural effusion is noted. Stable nodule seen in right middle lobe which can be considered benign at this point with no further follow-up required. No acute pulmonary disease is noted. Upper Abdomen: No acute abnormality. Musculoskeletal: Status post kyphoplasty of 2 lower thoracic vertebral bodies. No acute osseous abnormality is noted. Review of the MIP images confirms the above findings. IMPRESSION: No definite evidence of pulmonary embolus. No acute abnormality seen in the chest. Aortic Atherosclerosis (ICD10-I70.0). Electronically Signed   By: Lupita Raider M.D.   On: 09/20/2022 18:38   DG Chest Port 1 View  Result Date: 09/20/2022 CLINICAL DATA:  Shortness of breath. EXAM: PORTABLE CHEST 1 VIEW COMPARISON:  Chest radiograph dated 06/18/2022. FINDINGS: Bibasilar linear atelectasis. No focal consolidation, pleural effusion, or pneumothorax. The cardiac silhouette is within normal limits. Atherosclerotic calcification of the aortic arch. No acute osseous pathology. IMPRESSION: Bibasilar atelectasis. No focal consolidation. Electronically Signed   By: Elgie Collard M.D.   On: 09/20/2022 16:31        Scheduled Meds:  cholecalciferol  1,000 Units Oral Daily   DULoxetine  40 mg Oral BID   enoxaparin (LOVENOX) injection  40 mg Subcutaneous Q24H   guaiFENesin  600 mg Oral BID   hydrALAZINE  10 mg Oral TID   ipratropium-albuterol  3 mL Nebulization TID   levothyroxine  62.5 mcg Oral Q0600   methylPREDNISolone (SOLU-MEDROL) injection  40 mg Intravenous Q8H   metoprolol succinate  12.5 mg Oral Daily   potassium chloride  10 mEq Oral Daily   potassium chloride  40 mEq Oral Once   pregabalin  100 mg Oral q1600   Continuous Infusions:   LOS: 0 days    Time spent: 35 minutes    Kerie Badger A Haili Donofrio, MD [facility]   If 7PM-7AM, please contact night-coverage [email or web address]  09/21/2022, 8:08 AM

## 2022-09-21 NOTE — Assessment & Plan Note (Signed)
-   We will continue Synthroid and check TSH. 

## 2022-09-21 NOTE — Progress Notes (Signed)
  Echocardiogram 2D Echocardiogram has been performed.  Janalyn Harder 09/21/2022, 3:54 PM

## 2022-09-21 NOTE — Assessment & Plan Note (Signed)
-   We will continue Lyrica. 

## 2022-09-22 DIAGNOSIS — J9601 Acute respiratory failure with hypoxia: Secondary | ICD-10-CM | POA: Diagnosis not present

## 2022-09-22 LAB — BASIC METABOLIC PANEL
Anion gap: 10 (ref 5–15)
BUN: 30 mg/dL — ABNORMAL HIGH (ref 8–23)
CO2: 29 mmol/L (ref 22–32)
Calcium: 9.4 mg/dL (ref 8.9–10.3)
Chloride: 98 mmol/L (ref 98–111)
Creatinine, Ser: 0.74 mg/dL (ref 0.44–1.00)
GFR, Estimated: >60 mL/min (ref >60)
Glucose, Bld: 162 mg/dL — ABNORMAL HIGH (ref 70–99)
Potassium: 4 mmol/L (ref 3.5–5.1)
Sodium: 137 mmol/L (ref 135–145)

## 2022-09-22 LAB — CBC
HCT: 34.8 % — ABNORMAL LOW (ref 36.0–46.0)
Hemoglobin: 11.1 g/dL — ABNORMAL LOW (ref 12.0–15.0)
MCH: 28.2 pg (ref 26.0–34.0)
MCHC: 31.9 g/dL (ref 30.0–36.0)
MCV: 88.5 fL (ref 80.0–100.0)
Platelets: 395 10*3/uL (ref 150–400)
RBC: 3.93 MIL/uL (ref 3.87–5.11)
RDW: 14.1 % (ref 11.5–15.5)
WBC: 13 10*3/uL — ABNORMAL HIGH (ref 4.0–10.5)
nRBC: 0 % (ref 0.0–0.2)

## 2022-09-22 MED ORDER — PREDNISONE 20 MG PO TABS
40.0000 mg | ORAL_TABLET | Freq: Every day | ORAL | Status: DC
  Administered 2022-09-22 – 2022-09-23 (×2): 40 mg via ORAL
  Filled 2022-09-22 (×2): qty 2

## 2022-09-22 MED ORDER — PREDNISONE 20 MG PO TABS: 40.0000 mg | ORAL_TABLET | Freq: Every day | ORAL | Status: DC

## 2022-09-22 NOTE — Progress Notes (Addendum)
PROGRESS NOTE    Rebecca Stanley  ZOX:[id number] DOB: [birth year] DOA: 09/20/2022 PCP: Gordan Payment., MD   Brief Narrative: 89 year old with past medical history significant for osteoarthritis status post right TKA about 6 weeks prior to admission at [facility], grade 1 diastolic dysfunction, hypertension, dyslipidemia, diverticulosis, mitral valve prolapse, palpitation presents to the ED complaining of shortness of breath, dry cough, wheezing over the last 4 weeks worse recently.  She reports 8 pounds weight gain after surgery.  She reports orthopnea. Evaluation in the ED she required 2 L to keep oxygen sat 92 to 96% chest x-ray showed by basilar atelectasis.  CTA chest negative for PE.  Assessment & Plan:   Principal Problem:   Acute respiratory failure with hypoxia (HCC) Active Problems:   Acute on chronic diastolic CHF (congestive heart failure) (HCC)   Uncontrolled hypertension   Hypothyroidism   Peripheral neuropathy   Acute bronchitis with bronchospasm   1-Acute respiratory failure with hypoxia: Acute bronchitis with bronchospasm -Schedule Duo-neb -Continue with  Pulmicort.  -BNP , troponin not significantly elevated, but with weight gain, continnue with IV lasix. Weight down from 160--154. She had some chest tightness this am, IV lasix ordered, nebulizer, received nitroglycerin. IV morphine. Per Son, she has history of anxiety and panic attack.  Off oxygen today, feels better.  Change IV steroids to oral.   Hypokalemia: Replaced.   Lactic acidosis. Suspect related to respiratory issues.  Afebrile, no leukocytosis.   Acute on chronic diastolic heart failure: BNP not significantly elevated.  Troponin negative.  Crackles on lung exam, weight gain. Continue with IV lasix.  Weight: 160---154  Uncontrolled hypertension: On metoprolol.  PRN Hydralazine If continue to be elevated could try Norvasc.   Hypothyroidism: Continue with Synthroid.   Peripheral neuropathy: On  Lyrica.   Anxiety: On Klonopin BID PRN  See documentation Below.  Pressure Injury 09/20/22 Sacrum Stage 2 -  Partial thickness loss of dermis presenting as a shallow open injury with a red, pink wound bed without slough. (Active)  09/20/22 2330  Location: Sacrum  Location Orientation:   Staging: Stage 2 -  Partial thickness loss of dermis presenting as a shallow open injury with a red, pink wound bed without slough.  Wound Description (Comments):   Present on Admission: Yes  Dressing Type Foam - Lift dressing to assess site every shift 09/22/22 1100         Estimated body mass index is 29.25 kg/m as calculated from the following:   Height as of this encounter: 5\' 1"  (1.549 m).   Weight as of this encounter: 70.2 kg.   DVT prophylaxis: Lovenox Code Status: DNR Family Communication: Son at bedside Disposition Plan:  Status is: Observation     Consultants:    Procedures:    Antimicrobials:  None  Subjective: She is feeling better, denies dyspnea,. Denies pain.   Objective: Vitals:   09/22/22 0802 09/22/22 1027 09/22/22 1100 09/22/22 1252  BP:    (!) 156/64  Pulse:    80  Resp:    17  Temp:    98 F (36.7 C)  TempSrc:    Oral  SpO2: 98%  94% 98%  Weight:  70.2 kg    Height:        Intake/Output Summary (Last 24 hours) at 09/22/2022 1604 Last data filed at 09/22/2022 0900 Gross per 24 hour  Intake 300 ml  Output --  Net 300 ml   Filed Weights   09/20/22 1526 09/22/22 1027  Weight: 72.6 kg 70.2 kg    Examination:  General exam: NAD Respiratory system: BL crackles, ronchus Cardiovascular system: S 1,. S 2 RRR Gastrointestinal system: BS present, soft, nt Central nervous system: Alert.  Extremities:Symmetric power.     Data Reviewed: I have personally reviewed following labs and imaging studies  CBC: Recent Labs  Lab 09/20/22 1530 09/21/22 0631 09/22/22 0359  WBC 7.8 6.3 13.0*  HGB 11.4* 11.7* 11.1*  HCT 35.9* 37.0 34.8*  MCV 87.3  86.9 88.5  PLT 383 411* 395   Basic Metabolic Panel: Recent Labs  Lab 09/20/22 1530 09/21/22 0631 09/21/22 0950 09/22/22 0359  NA 133* 135  --  137  K 3.9 3.0*  --  4.0  CL 97* 94*  --  98  CO2 26 28  --  29  GLUCOSE 91 154*  --  162*  BUN 18 18  --  30*  CREATININE 0.71 0.66  --  0.74  CALCIUM 9.2 9.2  --  9.4  MG  --   --  2.0  --    GFR: Estimated Creatinine Clearance: 42.7 mL/min (by C-G formula based on SCr of 0.74 mg/dL). Liver Function Tests: Recent Labs  Lab 09/20/22 1552  AST 25  ALT 18  ALKPHOS 58  BILITOT 0.7  PROT 7.5  ALBUMIN 4.0   Recent Labs  Lab 09/20/22 1552  LIPASE 34   No results for input(s): "AMMONIA" in the last 168 hours. Coagulation Profile: No results for input(s): "INR", "PROTIME" in the last 168 hours. Cardiac Enzymes: No results for input(s): "CKTOTAL", "CKMB", "CKMBINDEX", "TROPONINI" in the last 168 hours. BNP (last 3 results) No results for input(s): "PROBNP" in the last 8760 hours. HbA1C: No results for input(s): "HGBA1C" in the last 72 hours. CBG: Recent Labs  Lab 09/20/22 1533  GLUCAP 84   Lipid Profile: No results for input(s): "CHOL", "HDL", "LDLCALC", "TRIG", "CHOLHDL", "LDLDIRECT" in the last 72 hours. Thyroid Function Tests: Recent Labs    09/21/22 0357  TSH 0.631   Anemia Panel: Recent Labs    09/21/22 0950  VITAMINB12 384   Sepsis Labs: Recent Labs  Lab 09/20/22 1552 09/21/22 0357  LATICACIDVEN 0.8 3.5*    Recent Results (from the past 240 hour(s))  Blood culture (routine x 2)     Status: None (Preliminary result)   Collection Time: 09/20/22  5:00 PM   Specimen: BLOOD  Result Value Ref Range Status   Specimen Description   Final    BLOOD BLOOD RIGHT FOREARM Performed at [facility], [address] [place], [place] [address]    Special Requests   Final    BOTTLES DRAWN AEROBIC AND ANAEROBIC Blood Culture adequate volume Performed at [facility], [address]  [place], [place] [address]    Culture   Final    NO GROWTH 2 DAYS Performed at [facility], [address], [place], [place] [address]    Report Status PENDING  Incomplete  Blood culture (routine x 2)     Status: None (Preliminary result)   Collection Time: 09/20/22  5:16 PM   Specimen: BLOOD  Result Value Ref Range Status   Specimen Description   Final    BLOOD LEFT ANTECUBITAL Performed at [facility], [address] [place], [place] [address]    Special Requests   Final    BOTTLES DRAWN AEROBIC AND ANAEROBIC Blood Culture adequate volume Performed at [facility]  [address], [place], [place] [address]    Culture   Final    NO GROWTH 2 DAYS Performed at [facility], [address], [place], [place] [address]    Report Status PENDING  Incomplete         Radiology Studies: ECHOCARDIOGRAM COMPLETE  Result Date: 09/21/2022    ECHOCARDIOGRAM REPORT   Patient Name:   EULINE KIMBLER Heyer Date of Exam: 09/21/2022 Medical Rec #:  [id number]     Height:       61.0 in Accession #:    [id number]    Weight:       160.0 lb Date of Birth:  03-26-1930     BSA:          1.718 m Patient Age:    89 years      BP:           144/58 mmHg Patient Gender: F             HR:           91 bpm. Exam Location:  Inpatient Procedure: 2D Echo, Color Doppler and Cardiac Doppler Indications:    R07.9* Chest pain, unspecified  History:        Patient has prior history of Echocardiogram examinations, most                 recent 10/05/2021. CHF, Signs/Symptoms:Dyspnea, Syncope and                 Edema; Risk Factors:Hypertension.  Sonographer:    Sheralyn Boatman RDCS Referring Phys: [id number] [facility] A Robel Wuertz  Sonographer Comments: Technically difficult study due to poor echo windows. IMPRESSIONS  1. Left ventricular ejection fraction, by estimation, is 65 to 70%. The left ventricle has normal function. The left ventricle has no regional wall motion abnormalities. There is moderate concentric left  ventricular hypertrophy. Left ventricular diastolic parameters are consistent with Grade I diastolic dysfunction (impaired relaxation).  2. Right ventricular systolic function is normal. The right ventricular size is normal. There is normal pulmonary artery systolic pressure.  3. Left atrial size was mildly dilated.  4. The mitral valve is normal in structure. Trivial mitral valve regurgitation. No evidence of mitral stenosis.  5. The aortic valve is normal in structure. Aortic valve regurgitation is trivial. Aortic valve sclerosis/calcification is present, without any evidence of aortic stenosis. Aortic valve area, by VTI measures 2.51 cm. Aortic valve mean gradient measures  7.0 mmHg. Aortic valve Vmax measures 1.76 m/s.  6. The inferior vena cava is normal in size with greater than 50% respiratory variability, suggesting right atrial pressure of 3 mmHg. FINDINGS  Left Ventricle: Left ventricular ejection fraction, by estimation, is 65 to 70%. The left ventricle has normal function. The left ventricle has no regional wall motion abnormalities. The left ventricular internal cavity size was normal in size. There is  moderate concentric left ventricular hypertrophy. Left ventricular diastolic parameters are consistent with Grade I diastolic dysfunction (impaired relaxation). Right Ventricle: The right ventricular size is normal. No increase in right ventricular wall thickness. Right ventricular systolic function is normal. There is normal pulmonary artery systolic pressure. The tricuspid regurgitant velocity is 2.63 m/s, and  with an assumed right atrial pressure of 8 mmHg, the estimated right ventricular systolic pressure is 35.7 mmHg. Left Atrium: Left atrial size was mildly dilated. Right Atrium: Right atrial size was normal in size. Pericardium: There is no evidence of pericardial effusion. Mitral Valve: The  mitral valve is normal in structure. There is mild calcification of the mitral valve leaflet(s). Trivial  mitral valve regurgitation. No evidence of mitral valve stenosis. MV peak gradient, 10.5 mmHg. The mean mitral valve gradient is 4.0 mmHg. Tricuspid Valve: The tricuspid valve is normal in structure. Tricuspid valve regurgitation is trivial. No evidence of tricuspid stenosis. Aortic Valve: The aortic valve is normal in structure. Aortic valve regurgitation is trivial. Aortic valve sclerosis/calcification is present, without any evidence of aortic stenosis. Aortic valve mean gradient measures 7.0 mmHg. Aortic valve peak gradient measures 12.4 mmHg. Aortic valve area, by VTI measures 2.51 cm. Pulmonic Valve: The pulmonic valve was normal in structure. Pulmonic valve regurgitation is trivial. No evidence of pulmonic stenosis. Aorta: The aortic root is normal in size and structure. Venous: The inferior vena cava is normal in size with greater than 50% respiratory variability, suggesting right atrial pressure of 3 mmHg. IAS/Shunts: No atrial level shunt detected by color flow Doppler.  LEFT VENTRICLE PLAX 2D LVIDd:         2.50 cm     Diastology LVIDs:         1.70 cm     LV e' medial:    4.13 cm/s LV PW:         1.30 cm     LV E/e' medial:  22.0 LV IVS:        1.50 cm     LV e' lateral:   5.77 cm/s LVOT diam:     2.00 cm     LV E/e' lateral: 15.7 LV SV:         100 LV SV Index:   58 LVOT Area:     3.14 cm  LV Volumes (MOD) LV vol d, MOD A2C: 83.4 ml LV vol d, MOD A4C: 50.8 ml LV vol s, MOD A2C: 27.5 ml LV vol s, MOD A4C: 18.3 ml LV SV MOD A2C:     55.9 ml LV SV MOD A4C:     50.8 ml LV SV MOD BP:      45.9 ml RIGHT VENTRICLE            IVC RV S prime:     9.57 cm/s  IVC diam: 2.00 cm TAPSE (M-mode): 1.8 cm LEFT ATRIUM             Index        RIGHT ATRIUM          Index LA diam:        2.50 cm 1.46 cm/m   RA Area:     8.82 cm LA Vol (A2C):   41.6 ml 24.22 ml/m  RA Volume:   14.60 ml 8.50 ml/m LA Vol (A4C):   38.6 ml 22.47 ml/m LA Biplane Vol: 42.4 ml 24.68 ml/m  AORTIC VALVE AV Area (Vmax):    2.12 cm AV Area  (Vmean):   2.03 cm AV Area (VTI):     2.51 cm AV Vmax:           176.00 cm/s AV Vmean:          126.000 cm/s AV VTI:            0.397 m AV Peak Grad:      12.4 mmHg AV Mean Grad:      7.0 mmHg LVOT Vmax:         119.00 cm/s LVOT Vmean:        81.500 cm/s LVOT VTI:  0.317 m LVOT/AV VTI ratio: 0.80  AORTA Ao Root diam: 2.90 cm Ao Asc diam:  3.10 cm MITRAL VALVE                TRICUSPID VALVE MV Area (PHT): 4.49 cm     TR Peak grad:   27.7 mmHg MV Area VTI:   3.53 cm     TR Vmax:        263.00 cm/s MV Peak grad:  10.5 mmHg MV Mean grad:  4.0 mmHg     SHUNTS MV Vmax:       1.62 m/s     Systemic VTI:  0.32 m MV Vmean:      88.4 cm/s    Systemic Diam: 2.00 cm MV Decel Time: 169 msec MV E velocity: 90.70 cm/s MV A velocity: 140.00 cm/s MV E/A ratio:  0.65 Arvilla Meres MD Electronically signed by Arvilla Meres MD Signature Date/Time: 09/21/2022/4:04:08 PM    Final    CT Angio Chest PE W and/or Wo Contrast  Result Date: 09/20/2022 CLINICAL DATA:  Chest pain and shortness of breath. EXAM: CT ANGIOGRAPHY CHEST WITH CONTRAST TECHNIQUE: Multidetector CT imaging of the chest was performed using the standard protocol during bolus administration of intravenous contrast. Multiplanar CT image reconstructions and MIPs were obtained to evaluate the vascular anatomy. RADIATION DOSE REDUCTION: This exam was performed according to the departmental dose-optimization program which includes automated exposure control, adjustment of the mA and/or kV according to patient size and/or use of iterative reconstruction technique. CONTRAST:  75mL OMNIPAQUE IOHEXOL 350 MG/ML SOLN COMPARISON:  April 20, 2018. FINDINGS: Cardiovascular: Satisfactory opacification of the pulmonary arteries to the segmental level. No evidence of pulmonary embolism. Normal heart size. No pericardial effusion. Mediastinum/Nodes: No enlarged mediastinal, hilar, or axillary lymph nodes. Thyroid gland, trachea, and esophagus demonstrate no significant  findings. Lungs/Pleura: No pneumothorax or pleural effusion is noted. Stable nodule seen in right middle lobe which can be considered benign at this point with no further follow-up required. No acute pulmonary disease is noted. Upper Abdomen: No acute abnormality. Musculoskeletal: Status post kyphoplasty of 2 lower thoracic vertebral bodies. No acute osseous abnormality is noted. Review of the MIP images confirms the above findings. IMPRESSION: No definite evidence of pulmonary embolus. No acute abnormality seen in the chest. Aortic Atherosclerosis (ICD10-I70.0). Electronically Signed   By: Lupita Raider M.D.   On: 09/20/2022 18:38   DG Chest Port 1 View  Result Date: 09/20/2022 CLINICAL DATA:  Shortness of breath. EXAM: PORTABLE CHEST 1 VIEW COMPARISON:  Chest radiograph dated 06/18/2022. FINDINGS: Bibasilar linear atelectasis. No focal consolidation, pleural effusion, or pneumothorax. The cardiac silhouette is within normal limits. Atherosclerotic calcification of the aortic arch. No acute osseous pathology. IMPRESSION: Bibasilar atelectasis. No focal consolidation. Electronically Signed   By: Elgie Collard M.D.   On: 09/20/2022 16:31        Scheduled Meds:  budesonide (PULMICORT) nebulizer solution  0.25 mg Nebulization BID   cholecalciferol  1,000 Units Oral Daily   vitamin B-12  1,000 mcg Oral Daily   DULoxetine  40 mg Oral BID   enoxaparin (LOVENOX) injection  40 mg Subcutaneous Q24H   furosemide  20 mg Intravenous Daily   guaiFENesin  600 mg Oral BID   hydrALAZINE  10 mg Oral Q8H   ipratropium-albuterol  3 mL Nebulization TID   levothyroxine  62.5 mcg Oral Q0600   metoprolol succinate  12.5 mg Oral Daily   potassium chloride  10 mEq Oral Daily  predniSONE  40 mg Oral Q breakfast   pregabalin  100 mg Oral q1600   Continuous Infusions:   LOS: 1 day    Time spent: 35 minutes    Dorina Ribaudo A Kahleb Mcclane, MD [facility]   If 7PM-7AM, please contact  night-coverage [email or web address]  09/22/2022, 4:04 PM

## 2022-09-22 NOTE — Plan of Care (Signed)

## 2022-09-23 DIAGNOSIS — J9601 Acute respiratory failure with hypoxia: Secondary | ICD-10-CM | POA: Diagnosis not present

## 2022-09-23 LAB — BASIC METABOLIC PANEL
Anion gap: 8 (ref 5–15)
BUN: 28 mg/dL — ABNORMAL HIGH (ref 8–23)
CO2: 29 mmol/L (ref 22–32)
Calcium: 8.6 mg/dL — ABNORMAL LOW (ref 8.9–10.3)
Chloride: 97 mmol/L — ABNORMAL LOW (ref 98–111)
Creatinine, Ser: 0.63 mg/dL (ref 0.44–1.00)
GFR, Estimated: >60 mL/min (ref >60)
Glucose, Bld: 99 mg/dL (ref 70–99)
Potassium: 3.7 mmol/L (ref 3.5–5.1)
Sodium: 134 mmol/L — ABNORMAL LOW (ref 135–145)

## 2022-09-23 LAB — CULTURE, BLOOD (ROUTINE X 2): Special Requests: ADEQUATE

## 2022-09-23 MED ORDER — HYDRALAZINE HCL 10 MG PO TABS
10.0000 mg | ORAL_TABLET | Freq: Three times a day (TID) | ORAL | Status: DC
  Filled 2022-09-23: qty 1

## 2022-09-23 MED ORDER — IPRATROPIUM-ALBUTEROL 0.5-2.5 (3) MG/3ML IN SOLN: 3.0000 mL | Freq: Three times a day (TID) | RESPIRATORY_TRACT | 0 refills | Status: DC

## 2022-09-23 MED ORDER — PREDNISONE 20 MG PO TABS: 40.0000 mg | ORAL_TABLET | Freq: Every day | ORAL | 0 refills | Status: AC

## 2022-09-23 MED ORDER — GUAIFENESIN ER 600 MG PO TB12: 600.0000 mg | ORAL_TABLET | Freq: Two times a day (BID) | ORAL | 0 refills | Status: AC

## 2022-09-23 MED ORDER — HYDRALAZINE HCL 10 MG PO TABS
10.0000 mg | ORAL_TABLET | Freq: Once | ORAL | Status: AC
  Administered 2022-09-23: 10 mg via ORAL
  Filled 2022-09-23 (×2): qty 1

## 2022-09-23 MED ORDER — HYDRALAZINE HCL 50 MG PO TABS: 25.0000 mg | ORAL_TABLET | Freq: Three times a day (TID) | ORAL | Status: DC

## 2022-09-23 MED ORDER — HYDRALAZINE HCL 25 MG PO TABS: 25.0000 mg | ORAL_TABLET | Freq: Three times a day (TID) | ORAL | 0 refills | Status: DC

## 2022-09-23 MED ORDER — ALBUTEROL SULFATE HFA 108 (90 BASE) MCG/ACT IN AERS: 2.0000 | INHALATION_SPRAY | Freq: Four times a day (QID) | RESPIRATORY_TRACT | 0 refills | Status: DC | PRN

## 2022-09-23 MED ORDER — CYANOCOBALAMIN 1000 MCG PO TABS: 1000.0000 ug | ORAL_TABLET | Freq: Every day | ORAL | 0 refills | Status: DC

## 2022-09-23 MED ORDER — FUROSEMIDE 20 MG PO TABS: 20.0000 mg | ORAL_TABLET | ORAL | Status: DC | PRN

## 2022-09-23 MED ORDER — IPRATROPIUM-ALBUTEROL 0.5-2.5 (3) MG/3ML IN SOLN: 3.0000 mL | Freq: Two times a day (BID) | RESPIRATORY_TRACT | Status: DC

## 2022-09-23 NOTE — Plan of Care (Signed)

## 2022-09-23 NOTE — Plan of Care (Signed)
Patient slept this shift. No shortness of breath reported this morning.  Denies pain. Confusion noted when awake, pt was easily oriented to care and surroundings. Assistance given with transfer to [facility] with use of walker. Tolerated well. Needs attended. Continue treatment regimen.  Problem: Education: Goal: Knowledge of General Education information will improve Description: Including pain rating scale, medication(s)/side effects and non-pharmacologic comfort measures Outcome: Progressing   Problem: Health Behavior/Discharge Planning: Goal: Ability to manage health-related needs will improve Outcome: Progressing   Problem: Clinical Measurements: Goal: Will remain free from infection Outcome: Progressing Goal: Respiratory complications will improve Outcome: Progressing   Problem: Activity: Goal: Risk for activity intolerance will decrease Outcome: Progressing   Problem: Nutrition: Goal: Adequate nutrition will be maintained Outcome: Progressing   Problem: Coping: Goal: Level of anxiety will decrease Outcome: Progressing   Problem: Pain Managment: Goal: General experience of comfort will improve Outcome: Progressing   Problem: Safety: Goal: Ability to remain free from injury will improve Outcome: Progressing

## 2022-09-23 NOTE — Progress Notes (Signed)
Pt is discharging home with no needs. Pt is alert and oriented. Son is at the bedside. AVS was given and explained to pt and pt son. Time was given to ask questions. IV lines were discontinued. Pt belongings were packed and sent home with the pt. Pt son to transport pt home at discharge. Pt escorted to main entrance via wheelchair by RN.

## 2022-09-23 NOTE — TOC Transition Note (Signed)
Transition of Care [facility]) - CM/SW Discharge Note  Patient Details  Name: Rebecca Stanley MRN: [id number] Date of Birth: February 20, 1931  Transition of Care [facility]) CM/SW Contact:  Ewing Schlein, LCSW Phone Number: 09/23/2022, 1:02 PM  Clinical Narrative: Patient will need a nebulizer machine and is agreeable to having [employer] delivering a nebulizer machine to her room. CSW made DME referral to [place] with [employer], which was accepted. [employer] to deliver DME to room. PT recommended resuming OPPT, which son confirmed patient will resume at [facility] on 09/28/22. TOC signing off.  Final next level of care: OP Rehab Barriers to Discharge: No Barriers Identified  Patient Goals and CMS Choice CMS Medicare.gov Compare Post Acute Care list provided to:: Patient Choice offered to / list presented to : Patient  Discharge Plan and Services Additional resources added to the After Visit Summary for          DME Arranged: Nebulizer machine DME Agency: [employer] Date DME Agency Contacted: 09/23/22 Time DME Agency Contacted: 1218 Representative spoke with at DME Agency: [place]  Social Determinants of Health (SDOH) Interventions SDOH Screenings   Food Insecurity: No Food Insecurity (09/20/2022)  Housing: Low Risk  (09/20/2022)  Transportation Needs: No Transportation Needs (09/20/2022)  Utilities: Not At Risk (09/20/2022)  Financial Resource Strain: Low Risk  (08/04/2022)   Received from [facility], [facility]  Physical Activity: Insufficiently Active (07/04/2022)   Received from [facility], [facility]  Social Connections: Moderately Isolated (07/04/2022)   Received from [facility], [facility]  Tobacco Use: Low Risk  (09/20/2022)   Readmission Risk Interventions    09/22/2022    3:01 PM  Readmission Risk Prevention Plan  Transportation Screening Complete  PCP or Specialist Appt within 5-7 Days Complete   Home Care Screening Complete  Medication Review (RN CM) Complete

## 2022-09-23 NOTE — Evaluation (Signed)
Occupational Therapy Evaluation Patient Details Name: Rebecca Stanley MRN: [id number] DOB: [birth year] Today's Date: 09/23/2022   History of Present Illness 89 yo female presented to ED on 09/20/2022 due to progressive weakness and "feeling out of it" over the last week. Pt recently dx with UTI and completed 5 day course of Cipro and 8 day course of Augmentin. Pt underwent R TKA 6 wks ago at [facility]. 09/20/2022 CXR and chest CT negative for PE and indicative of bibasilar atelectasis.pt is currently requiring supplemental O2 and has dx of heart failure with EF of 55-60%, dCHF and acute respiratory failure with hypoxia. Pt has PMH including but not limited to: cancer, HDL, HTN, mitral valve prolapse, OA, colitis, peripheral neuropathy, thyroid dz, glute medius tendon rupture and repair, heel tendon repair, hip surgery, and kyphoplasty, T12-L3.   Clinical Impression   Patient evaluated by Occupational Therapy with no further acute OT needs identified. All education has been completed and the patient has no further questions. Pt lives at home with family and receives necessary assistance for ADL and IADL tasks. Caregiver assists 3X/wk for 3-4 hours as well. No follow-up Occupational Therapy or equipment needs. OT is signing off. Thank you for this referral.       Recommendations for follow up therapy are one component of a multi-disciplinary discharge planning process, led by the attending physician.  Recommendations may be updated based on patient status, additional functional criteria and insurance authorization.   Assistance Recommended at Discharge Intermittent Supervision/Assistance  Patient can return home with the following A little help with walking and/or transfers;A little help with bathing/dressing/bathroom;Help with stairs or ramp for entrance;Assist for transportation    Functional Status Assessment  Patient has not had a recent decline in their functional status  Equipment Recommendations  None  recommended by OT (pt has all necessary DME at home)       Precautions / Restrictions Precautions Precautions: Fall Precaution Comments: R TKA (7 weeks ago) Required Braces or Orthoses: Other Brace Other Brace: Left wrist brace (thumb arthritis) Restrictions Weight Bearing Restrictions: No      Mobility Bed Mobility Overal bed mobility:  (Pt up in recliner upon therapy arrival)    Patient Response: Cooperative  Transfers Overall transfer level:  (defer to PT evaluation for transfer level)         Balance Overall balance assessment: Mild deficits observed, not formally tested (Defer to PT eval for balance assessment)       ADL either performed or assessed with clinical judgement   ADL         General ADL Comments: Requires assistance for BADL tasks  which pt received at baseline from family and caregiver.     Vision Baseline Vision/History: 1 Wears glasses Ability to See in Adequate Light: 0 Adequate Patient Visual Report: No change from baseline              Pertinent Vitals/Pain Pain Assessment Pain Assessment: Faces Faces Pain Scale: No hurt     Hand Dominance Right   Extremity/Trunk Assessment Upper Extremity Assessment Upper Extremity Assessment: Overall WFL for tasks assessed (Age appropriate strength in BUE. Arthritic changes to bilateral hands with increased left thumb pain with use d/t arthritis. Wear left hand brace to help with pain management and joint support.)   Lower Extremity Assessment Lower Extremity Assessment: Overall WFL for tasks assessed   Cervical / Trunk Assessment Cervical / Trunk Assessment: Back Surgery   Communication Communication Communication: HOH (bilateral hearing aids)   Cognition  Arousal/Alertness: Awake/alert Behavior During Therapy: WFL for tasks assessed/performed Overall Cognitive Status: Within Functional Limits for tasks assessed       General Comments  Education provided on use of Vitamin E oil for scar  management and healing on right knee (TKA) and completing scar massage with application.            Home Living Family/patient expects to be discharged to:: Private residence Living Arrangements: Children Available Help at Discharge: Family Type of Home: House Home Access: Stairs to enter [employer] of Steps: 4 Entrance Stairs-Rails: Right;Left Home Layout: Multi-level;Able to live on main level with bedroom/bathroom;Laundry or work area in basement Alternate Teacher, music of Steps: chair lift to basement   [employer] Shower/Tub: Estate manager/land agent Accessibility: Yes How Accessible: Accessible via walker Home Equipment: Agricultural consultant (2 wheels);Rollator (4 wheels);BSC/3in1;Wheelchair - manual;Shower seat          Prior Functioning/Environment Prior Level of Function : Needs assist       Physical Assist : Mobility (physical);ADLs (physical) Mobility (physical): Stairs ADLs (physical): Bathing;IADLs;Dressing Mobility Comments: Pt required A for step navigation with B UE support on one handrail to enter home, use of rollator for amb in home setting.  Pt previously participating with OPPT through [facility] following R TKA. pt and family requesting to resume OPPT ADLs Comments: Caregiver assist 3 x wk (~3-4 hours) for bathing tasks and any other additional ADL task needs. Family support for household management and IADLs.                 OT Goals(Current goals can be found in the care plan section) Acute Rehab OT Goals Patient Stated Goal: to go home and get some sleep  OT Frequency:  1X visit       AM-PAC OT "6 Clicks" Daily Activity     Outcome Measure Help from another person eating meals?: None Help from another person taking care of personal grooming?: A Little Help from another person toileting, which includes using toliet, bedpan, or urinal?: A Little Help from another person bathing (including washing, rinsing, drying)?: A Little Help  from another person to put on and taking off regular upper body clothing?: A Little Help from another person to put on and taking off regular lower body clothing?: A Little 6 Click Score: 19   End of Session    Activity Tolerance: Patient tolerated treatment well Patient left: in chair;with chair alarm set;with call bell/phone within reach;with family/visitor present  OT Visit Diagnosis: Muscle weakness (generalized) (M62.81)                Time: 8657-8469 OT Time Calculation (min): 10 min Charges:  OT General Charges $OT Visit: 1 Visit OT Evaluation $OT Eval Low Complexity: 1 Low  Limmie Patricia, OTR/L,CBIS  Supplemental OT - [facility] and [facility] Secure Chat Preferred    Tyreese Thain, Charisse March 09/23/2022, 2:38 PM

## 2022-09-23 NOTE — Discharge Summary (Signed)
Physician Discharge Summary   Patient: Rebecca Stanley MRN: [id number] DOB: 01/25/1931  Admit date:     09/20/2022  Discharge date: 09/23/22  Discharge Physician: Alba Cory   PCP: Gordan Payment., MD   Recommendations at discharge:    Keep appointment with Pulmonologist on 7/16 Monitor weight, use lasix as needed.  Adjust BP medications as needed.   Discharge Diagnoses: Principal Problem:   Acute respiratory failure with hypoxia (HCC) Active Problems:   Acute on chronic diastolic CHF (congestive heart failure) (HCC)   Uncontrolled hypertension   Hypothyroidism   Peripheral neuropathy   Acute bronchitis with bronchospasm  Resolved Problems:   * No resolved hospital problems. *  Hospital Course: 89 year old with past medical history significant for osteoarthritis status post right TKA about 6 weeks prior to admission at [facility], grade 1 diastolic dysfunction, hypertension, dyslipidemia, diverticulosis, mitral valve prolapse, palpitation presents to the ED complaining of shortness of breath, dry cough, wheezing over the last 4 weeks worse recently.  She reports 8 pounds weight gain after surgery.  She reports orthopnea. Evaluation in the ED she required 2 L to keep oxygen sat 92 to 96% chest x-ray showed by basilar atelectasis.  CTA chest negative for PE.    Assessment and Plan: 1-Acute respiratory failure with hypoxia: Acute bronchitis with bronchospasm -Schedule Duo-neb -Treated  with  Pulmicort.  -BNP , troponin not significantly elevated, but with weight gain, continnue with IV lasix. Weight down from 160--154.--152 She had some chest tightness this am, IV lasix ordered, nebulizer, received nitroglycerin. IV morphine. Per Son, she has history of anxiety and panic attack.  Off oxygen today, feels better.  Change IV steroids to oral.  Transition oral lasix at discharge--to be use as needed for more than 2 pounds weight gain in 24 hours.  Will order DME nebulizer.    Hypokalemia: Replaced.    Lactic acidosis. Suspect related to respiratory issues.  Afebrile, no leukocytosis.    Acute on chronic diastolic heart failure: BNP not significantly elevated.  Troponin negative.  Crackles on lung exam, weight gain. Treated with IV lasix.  Weight: 160---154--152. Resume lasix oral PRN for weight gain.    Uncontrolled hypertension: On metoprolol.  PRN Hydralazine Increase Hydralazine to 25 mg TID. Might need to resume prior home dose of 10 mg after she complete prednisone.   Hypothyroidism: Continue with Synthroid.    Peripheral neuropathy: On Lyrica.    Anxiety: On Klonopin BID PRN   See documentation Below.      Pressure Injury 09/20/22 Sacrum Stage 2 -  Partial thickness loss of dermis presenting as a shallow open injury with a red, pink wound bed without slough. (Active)  09/20/22 2330  Location: Sacrum  Location Orientation:   Staging: Stage 2 -  Partial thickness loss of dermis presenting as a shallow open injury with a red, pink wound bed without slough.  Wound Description (Comments):   Present on Admission: Yes  Dressing Type Foam - Lift dressing to assess site every shift 09/22/22 1100            Estimated body mass index is 29.25 kg/m as calculated from the following:   Height as of this encounter: 5\' 1"  (1.549 m).   Weight as of this encounter: 70.2 kg.        Consultants: None Procedures performed: None Disposition: Home Diet recommendation:  Discharge Diet Orders (From admission, onward)     Start     Ordered   09/23/22 0000  Diet - low sodium heart healthy        09/23/22 1128           Cardiac diet DISCHARGE MEDICATION: Allergies as of 09/23/2022       Reactions   Losartan Swelling   Facial swelling   Amlodipine Swelling   Tolerates 2.5mg .    Cholestyramine Other (See Comments)   Cortisone Other (See Comments)    High blood pressure   Prednisone Other (See Comments)   High blood pressure   Tramadol     Other reaction(s): Other (See Comments), Unknown Panic attacks with other meds that she is taking    Lisinopril Cough        Medication List     STOP taking these medications    amLODipine 2.5 MG tablet Commonly known as: NORVASC   amoxicillin-clavulanate 875-125 MG tablet Commonly known as: AUGMENTIN   ciprofloxacin 500 MG tablet Commonly known as: CIPRO       TAKE these medications    Acetaminophen Extra Strength 500 MG Tabs Take 1,000 mg by mouth in the morning and at bedtime.   albuterol 108 (90 Base) MCG/ACT inhaler Commonly known as: VENTOLIN HFA Inhale 2 puffs into the lungs every 6 (six) hours as needed for wheezing or shortness of breath. What changed:  when to take this reasons to take this   amitriptyline 25 MG tablet Commonly known as: ELAVIL Take 25 mg by mouth at bedtime.   aspirin 81 MG chewable tablet Chew 81 mg by mouth 2 (two) times daily.   clonazePAM 0.5 MG tablet Commonly known as: KLONOPIN Take 0.25-0.5 mg by mouth at bedtime.   cyanocobalamin 1000 MCG tablet Take 1 tablet (1,000 mcg total) by mouth daily. Start taking on: September 24, 2022   DULoxetine 20 MG capsule Commonly known as: CYMBALTA Take 20 mg by mouth 2 (two) times daily.   furosemide 20 MG tablet Commonly known as: LASIX Take 1 tablet (20 mg total) by mouth as needed (weight gain or swelling).   Gemtesa 75 MG Tabs Generic drug: Vibegron Take 1 tablet by mouth at bedtime.   guaiFENesin 600 MG 12 hr tablet Commonly known as: MUCINEX Take 1 tablet (600 mg total) by mouth 2 (two) times daily.   hydrALAZINE 25 MG tablet Commonly known as: APRESOLINE Take 1 tablet (25 mg total) by mouth every 8 (eight) hours. What changed:  medication strength how much to take when to take this   ipratropium-albuterol 0.5-2.5 (3) MG/3ML Soln Commonly known as: DUONEB Take 3 mLs by nebulization 3 (three) times daily.   levothyroxine 25 MCG tablet Commonly known as:  SYNTHROID Take 62.5 mcg by mouth daily.   metoprolol succinate 25 MG 24 hr tablet Commonly known as: TOPROL-XL Take 12.5 mg by mouth at bedtime.   nitroGLYCERIN 0.4 MG SL tablet Commonly known as: Nitrostat Place 1 tablet (0.4 mg total) under the tongue every 5 (five) minutes as needed for chest pain. Maximum of 3 doses.   polyethylene glycol 17 g packet Commonly known as: MIRALAX / GLYCOLAX Take 1 packet by mouth daily.   potassium chloride 10 MEQ tablet Commonly known as: KLOR-CON M Take 1 tablet (10 mEq total) by mouth daily as needed. When you take lasix   predniSONE 20 MG tablet Commonly known as: DELTASONE Take 2 tablets (40 mg total) by mouth daily with breakfast for 5 days. Start taking on: September 24, 2022   pregabalin 100 MG capsule Commonly known as: LYRICA Take 100 mg  by mouth daily. At 4 pm   VITAMIN D PO Take 1 tablet by mouth daily.               Durable Medical Equipment  (From admission, onward)           Start     Ordered   09/23/22 1125  For home use only DME Nebulizer machine  Once       Question Answer Comment  Patient needs a nebulizer to treat with the following condition Acute bronchitis   Length of Need Lifetime      09/23/22 1124              Discharge Care Instructions  (From admission, onward)           Start     Ordered   09/23/22 0000  Discharge wound care:       Comments: See above   09/23/22 1128            Discharge Exam: Filed Weights   09/20/22 1526 09/22/22 1027 09/23/22 0500  Weight: 72.6 kg 70.2 kg 69.2 kg   General: NAD Lung: BL ronchus.   Condition at discharge: stable  The results of significant diagnostics from this hospitalization (including imaging, microbiology, ancillary and laboratory) are listed below for reference.   Imaging Studies: ECHOCARDIOGRAM COMPLETE  Result Date: 09/21/2022    ECHOCARDIOGRAM REPORT   Patient Name:   TRAMIYAH QUAYE Parsley Date of Exam: 09/21/2022 Medical Rec #:   [id number]     Height:       61.0 in Accession #:    [id number]    Weight:       160.0 lb Date of Birth:  [birth year]     BSA:          1.718 m Patient Age:    89 years      BP:           144/58 mmHg Patient Gender: F             HR:           91 bpm. Exam Location:  Inpatient Procedure: 2D Echo, Color Doppler and Cardiac Doppler Indications:    R07.9* Chest pain, unspecified  History:        Patient has prior history of Echocardiogram examinations, most                 recent 10/05/2021. CHF, Signs/Symptoms:Dyspnea, Syncope and                 Edema; Risk Factors:Hypertension.  Sonographer:    Sheralyn Boatman RDCS Referring Phys: [id number] [facility] A Renne Cornick  Sonographer Comments: Technically difficult study due to poor echo windows. IMPRESSIONS  1. Left ventricular ejection fraction, by estimation, is 65 to 70%. The left ventricle has normal function. The left ventricle has no regional wall motion abnormalities. There is moderate concentric left ventricular hypertrophy. Left ventricular diastolic parameters are consistent with Grade I diastolic dysfunction (impaired relaxation).  2. Right ventricular systolic function is normal. The right ventricular size is normal. There is normal pulmonary artery systolic pressure.  3. Left atrial size was mildly dilated.  4. The mitral valve is normal in structure. Trivial mitral valve regurgitation. No evidence of mitral stenosis.  5. The aortic valve is normal in structure. Aortic valve regurgitation is trivial. Aortic valve sclerosis/calcification is present, without any evidence of aortic stenosis. Aortic valve area, by VTI measures 2.51 cm. Aortic valve mean gradient measures  7.0 mmHg. Aortic valve Vmax measures 1.76 m/s.  6. The inferior vena cava is normal in size with greater than 50% respiratory variability, suggesting right atrial pressure of 3 mmHg. FINDINGS  Left Ventricle: Left ventricular ejection fraction, by estimation, is 65 to 70%. The left ventricle has normal function.  The left ventricle has no regional wall motion abnormalities. The left ventricular internal cavity size was normal in size. There is  moderate concentric left ventricular hypertrophy. Left ventricular diastolic parameters are consistent with Grade I diastolic dysfunction (impaired relaxation). Right Ventricle: The right ventricular size is normal. No increase in right ventricular wall thickness. Right ventricular systolic function is normal. There is normal pulmonary artery systolic pressure. The tricuspid regurgitant velocity is 2.63 m/s, and  with an assumed right atrial pressure of 8 mmHg, the estimated right ventricular systolic pressure is 35.7 mmHg. Left Atrium: Left atrial size was mildly dilated. Right Atrium: Right atrial size was normal in size. Pericardium: There is no evidence of pericardial effusion. Mitral Valve: The mitral valve is normal in structure. There is mild calcification of the mitral valve leaflet(s). Trivial mitral valve regurgitation. No evidence of mitral valve stenosis. MV peak gradient, 10.5 mmHg. The mean mitral valve gradient is 4.0 mmHg. Tricuspid Valve: The tricuspid valve is normal in structure. Tricuspid valve regurgitation is trivial. No evidence of tricuspid stenosis. Aortic Valve: The aortic valve is normal in structure. Aortic valve regurgitation is trivial. Aortic valve sclerosis/calcification is present, without any evidence of aortic stenosis. Aortic valve mean gradient measures 7.0 mmHg. Aortic valve peak gradient measures 12.4 mmHg. Aortic valve area, by VTI measures 2.51 cm. Pulmonic Valve: The pulmonic valve was normal in structure. Pulmonic valve regurgitation is trivial. No evidence of pulmonic stenosis. Aorta: The aortic root is normal in size and structure. Venous: The inferior vena cava is normal in size with greater than 50% respiratory variability, suggesting right atrial pressure of 3 mmHg. IAS/Shunts: No atrial level shunt detected by color flow Doppler.  LEFT  VENTRICLE PLAX 2D LVIDd:         2.50 cm     Diastology LVIDs:         1.70 cm     LV e' medial:    4.13 cm/s LV PW:         1.30 cm     LV E/e' medial:  22.0 LV IVS:        1.50 cm     LV e' lateral:   5.77 cm/s LVOT diam:     2.00 cm     LV E/e' lateral: 15.7 LV SV:         100 LV SV Index:   58 LVOT Area:     3.14 cm  LV Volumes (MOD) LV vol d, MOD A2C: 83.4 ml LV vol d, MOD A4C: 50.8 ml LV vol s, MOD A2C: 27.5 ml LV vol s, MOD A4C: 18.3 ml LV SV MOD A2C:     55.9 ml LV SV MOD A4C:     50.8 ml LV SV MOD BP:      45.9 ml RIGHT VENTRICLE            IVC RV S prime:     9.57 cm/s  IVC diam: 2.00 cm TAPSE (M-mode): 1.8 cm LEFT ATRIUM             Index        RIGHT ATRIUM          Index LA diam:  2.50 cm 1.46 cm/m   RA Area:     8.82 cm LA Vol (A2C):   41.6 ml 24.22 ml/m  RA Volume:   14.60 ml 8.50 ml/m LA Vol (A4C):   38.6 ml 22.47 ml/m LA Biplane Vol: 42.4 ml 24.68 ml/m  AORTIC VALVE AV Area (Vmax):    2.12 cm AV Area (Vmean):   2.03 cm AV Area (VTI):     2.51 cm AV Vmax:           176.00 cm/s AV Vmean:          126.000 cm/s AV VTI:            0.397 m AV Peak Grad:      12.4 mmHg AV Mean Grad:      7.0 mmHg LVOT Vmax:         119.00 cm/s LVOT Vmean:        81.500 cm/s LVOT VTI:          0.317 m LVOT/AV VTI ratio: 0.80  AORTA Ao Root diam: 2.90 cm Ao Asc diam:  3.10 cm MITRAL VALVE                TRICUSPID VALVE MV Area (PHT): 4.49 cm     TR Peak grad:   27.7 mmHg MV Area VTI:   3.53 cm     TR Vmax:        263.00 cm/s MV Peak grad:  10.5 mmHg MV Mean grad:  4.0 mmHg     SHUNTS MV Vmax:       1.62 m/s     Systemic VTI:  0.32 m MV Vmean:      88.4 cm/s    Systemic Diam: 2.00 cm MV Decel Time: 169 msec MV E velocity: 90.70 cm/s MV A velocity: 140.00 cm/s MV E/A ratio:  0.65 Arvilla Meres MD Electronically signed by Arvilla Meres MD Signature Date/Time: 09/21/2022/4:04:08 PM    Final    CT Angio Chest PE W and/or Wo Contrast  Result Date: 09/20/2022 CLINICAL DATA:  Chest pain and shortness of  breath. EXAM: CT ANGIOGRAPHY CHEST WITH CONTRAST TECHNIQUE: Multidetector CT imaging of the chest was performed using the standard protocol during bolus administration of intravenous contrast. Multiplanar CT image reconstructions and MIPs were obtained to evaluate the vascular anatomy. RADIATION DOSE REDUCTION: This exam was performed according to the departmental dose-optimization program which includes automated exposure control, adjustment of the mA and/or kV according to patient size and/or use of iterative reconstruction technique. CONTRAST:  75mL OMNIPAQUE IOHEXOL 350 MG/ML SOLN COMPARISON:  April 20, 2018. FINDINGS: Cardiovascular: Satisfactory opacification of the pulmonary arteries to the segmental level. No evidence of pulmonary embolism. Normal heart size. No pericardial effusion. Mediastinum/Nodes: No enlarged mediastinal, hilar, or axillary lymph nodes. Thyroid gland, trachea, and esophagus demonstrate no significant findings. Lungs/Pleura: No pneumothorax or pleural effusion is noted. Stable nodule seen in right middle lobe which can be considered benign at this point with no further follow-up required. No acute pulmonary disease is noted. Upper Abdomen: No acute abnormality. Musculoskeletal: Status post kyphoplasty of 2 lower thoracic vertebral bodies. No acute osseous abnormality is noted. Review of the MIP images confirms the above findings. IMPRESSION: No definite evidence of pulmonary embolus. No acute abnormality seen in the chest. Aortic Atherosclerosis (ICD10-I70.0). Electronically Signed   By: Lupita Raider M.D.   On: 09/20/2022 18:38   DG Chest Port 1 View  Result Date: 09/20/2022 CLINICAL DATA:  Shortness of breath.  EXAM: PORTABLE CHEST 1 VIEW COMPARISON:  Chest radiograph dated 06/18/2022. FINDINGS: Bibasilar linear atelectasis. No focal consolidation, pleural effusion, or pneumothorax. The cardiac silhouette is within normal limits. Atherosclerotic calcification of the aortic arch.  No acute osseous pathology. IMPRESSION: Bibasilar atelectasis. No focal consolidation. Electronically Signed   By: Elgie Collard M.D.   On: 09/20/2022 16:31    Microbiology: Results for orders placed or performed during the hospital encounter of 09/20/22  Blood culture (routine x 2)     Status: None (Preliminary result)   Collection Time: 09/20/22  5:00 PM   Specimen: BLOOD  Result Value Ref Range Status   Specimen Description   Final    BLOOD BLOOD RIGHT FOREARM Performed at [facility], [address] [place], [place] [address]    Special Requests   Final    BOTTLES DRAWN AEROBIC AND ANAEROBIC Blood Culture adequate volume Performed at [facility], [address] [place], [place] [address]    Culture   Final    NO GROWTH 3 DAYS Performed at [facility], [address], [place], [place] [address]    Report Status PENDING  Incomplete  Blood culture (routine x 2)     Status: None (Preliminary result)   Collection Time: 09/20/22  5:16 PM   Specimen: BLOOD  Result Value Ref Range Status   Specimen Description   Final    BLOOD LEFT ANTECUBITAL Performed at [facility], [address] [place], [place] [address]    Special Requests   Final    BOTTLES DRAWN AEROBIC AND ANAEROBIC Blood Culture adequate volume Performed at [facility], [address] [place], [place] [address]    Culture   Final    NO GROWTH 3 DAYS Performed at [facility], [address], [place], [place] [address]    Report Status PENDING  Incomplete    Labs: CBC: Recent Labs  Lab 09/20/22 1530 09/21/22 0631 09/22/22 0359  WBC 7.8 6.3 13.0*  HGB 11.4* 11.7* 11.1*  HCT 35.9* 37.0 34.8*  MCV 87.3 86.9 88.5  PLT 383 411* 395   Basic Metabolic Panel: Recent Labs  Lab 09/20/22 1530 09/21/22 0631 09/21/22 0950 09/22/22 0359 09/23/22 0743  NA 133* 135  --  137 134*  K 3.9 3.0*  --  4.0 3.7  CL 97* 94*  --  98 97*  CO2 26 28  --  29  29  GLUCOSE 91 154*  --  162* 99  BUN 18 18  --  30* 28*  CREATININE 0.71 0.66  --  0.74 0.63  CALCIUM 9.2 9.2  --  9.4 8.6*  MG  --   --  2.0  --   --    Liver Function Tests: Recent Labs  Lab 09/20/22 1552  AST 25  ALT 18  ALKPHOS 58  BILITOT 0.7  PROT 7.5  ALBUMIN 4.0   CBG: Recent Labs  Lab 09/20/22 1533  GLUCAP 84    Discharge time spent: greater than 30 minutes.  Signed: Alba Cory, MD [facility] 09/23/2022

## 2022-09-23 NOTE — Evaluation (Signed)
Physical Therapy Evaluation Patient Details Name: Rebecca Stanley MRN: [id number] DOB: 12-27-1930 Today's Date: 09/23/2022  History of Present Illness  89 yo female presented to ED on 09/20/2022 due to progressive weakness and "feeling out of it" over the last week. Pt recently dx with UTI and completed 5 day course of Cipro and 8 day course of Augmentin. Pt underwent R TKA 6 wks ago at [facility]. 09/20/2022 CXR and chest CT negative for PE and indicative of bibasilar atelectasis.pt is currently requiring supplemental O2 and has dx of heart failure with EF of 55-60%, dCHF and acute respiratory failure with hypoxia. Pt has PMH including but not limited to: cancer, HDL, HTN, mitral valve prolapse, OA, colitis, peripheral neuropathy, thyroid dz, glute medius tendon rupture and repair, heel tendon repair, hip surgery, and kyphoplasty, T12-L3.  Clinical Impression    Pt admitted with above diagnosis.  Pt currently with functional limitations due to the deficits listed below (see PT Problem List). Pt in bed when PT arrived, son present. Pt weaned from supplemental O2 and O2 saturation monitored t/o intervention. At rest 93% on RA and with exertion 91-93% on RA. Pt required increased time and min guard for supine to sit, min guard and cues for transfer tasks with use of personal rollator. Gait assessed with personal rollator 80 feet with min guard. PT encouraged pt and son to amb in hallway with rollator. Pt and son indicated pt was participating with OPPT at [facility] following R TKA and would like to resume OP services at time of d/c. Pt agreeable to sitting up in recliner following assist with toilting  noted posterior LOB with pt performing clothing management in standing with min A and cues to recover. Family present and all needs in place.   Pt will benefit from acute skilled PT to increase their independence and safety with mobility to allow discharge.          Assistance Recommended at Discharge Intermittent  Supervision/Assistance  If plan is discharge home, recommend the following:  Can travel by private vehicle  A little help with walking and/or transfers;A lot of help with bathing/dressing/bathroom;Assistance with cooking/housework;Assist for transportation;Help with stairs or ramp for entrance        Equipment Recommendations None recommended by PT (DME in home setting)  Recommendations for Other Services       Functional Status Assessment Patient has had a recent decline in their functional status and demonstrates the ability to make significant improvements in function in a reasonable and predictable amount of time.     Precautions / Restrictions Precautions Precautions: Fall Required Braces or Orthoses: Other Brace (L wrist splint) Restrictions Weight Bearing Restrictions: No      Mobility  Bed Mobility Overal bed mobility: Needs Assistance Bed Mobility: Supine to Sit     Supine to sit: Min guard     General bed mobility comments: min cues and HOB elevated    Transfers Overall transfer level: Needs assistance Equipment used: Rollator (4 wheels) (personal rollator) Transfers: Sit to/from Stand Sit to Stand: Min guard           General transfer comment: min cues, pt demonstrated poor recall for rollator brake management    Ambulation/Gait Ambulation/Gait assistance: Min guard Gait Distance (Feet): 80 Feet Assistive device: Rollator (4 wheels) Gait Pattern/deviations: Step-through pattern Gait velocity: decreased     General Gait Details: lateral sway noted with hx of glute rupture and repair,B  knee flexion t/o gait cycle  Stairs  Wheelchair Mobility     Tilt Bed    Modified Rankin (Stroke Patients Only)       Balance Overall balance assessment: Needs assistance Sitting-balance support: Feet supported Sitting balance-Leahy Scale: Good     Standing balance support: Bilateral upper extremity supported, During functional  activity, Reliant on assistive device for balance Standing balance-Leahy Scale: Poor                               Pertinent Vitals/Pain Pain Assessment Pain Assessment:  (pt reports wearing L wrist splint due to L thumb pain and anatomical abnormality, son reports internal pain stimulator in back)    Home Living Family/patient expects to be discharged to:: Private residence Living Arrangements: Children Available Help at Discharge: Family Type of Home: House Home Access: Stairs to enter Entrance Stairs-Rails: Doctor, general practice of Steps: 4 Alternate Level Stairs-Number of Steps: chair lift to basement Home Layout: Multi-level;Able to live on main level with bedroom/bathroom;Laundry or work area in [employer] Equipment: Agricultural consultant (2 wheels);Rollator (4 wheels);BSC/3in1;Wheelchair - manual      Prior Function Prior Level of Function : Needs assist       Physical Assist : Mobility (physical);ADLs (physical) Mobility (physical): Stairs ADLs (physical): Bathing;IADLs Mobility Comments: pt required A for step navigation with B UE support on one handrail to enter home, use of rollator for amb in home setting, caregiver assist 3 x wk for bathing tasks and family support for household management and IADLs. pt previously participating with OPPT through [facility] following R TKA. pt and family requesting to resume OPPT       Hand Dominance        Extremity/Trunk Assessment        Lower Extremity Assessment Lower Extremity Assessment: Overall WFL for tasks assessed    Cervical / Trunk Assessment Cervical / Trunk Assessment: Back Surgery  Communication   Communication: HOH (B hearing aids)  Cognition Arousal/Alertness: Awake/alert Behavior During Therapy: WFL for tasks assessed/performed Overall Cognitive Status: Within Functional Limits for tasks assessed                                          General Comments       Exercises     Assessment/Plan    PT Assessment Patient needs continued PT services  PT Problem List Decreased strength;Decreased range of motion;Decreased activity tolerance;Decreased balance;Decreased mobility;Decreased coordination       PT Treatment Interventions DME instruction;Gait training;Stair training;Functional mobility training;Therapeutic activities;Therapeutic exercise;Balance training;Neuromuscular re-education;Patient/family education    PT Goals (Current goals can be found in the Care Plan section)  Acute Rehab PT Goals Patient Stated Goal: to get stonger and improve activity tolerance as well as return to OPPT PT Goal Formulation: With patient Time For Goal Achievement: 10/07/22 Potential to Achieve Goals: Good    Frequency Min 1X/week     Co-evaluation               AM-PAC PT "6 Clicks" Mobility  Outcome Measure Help needed turning from your back to your side while in a flat bed without using bedrails?: None Help needed moving from lying on your back to sitting on the side of a flat bed without using bedrails?: A Little Help needed moving to and from a bed to a chair (including a wheelchair)?: A Little Help needed  standing up from a chair using your arms (e.g., wheelchair or bedside chair)?: A Little Help needed to walk in hospital room?: A Little Help needed climbing 3-5 steps with a railing? : A Lot 6 Click Score: 18    End of Session Equipment Utilized During Treatment: Gait belt (L wrist splint) Activity Tolerance: Patient tolerated treatment well Patient left: in chair;with call bell/phone within reach;with family/visitor present Nurse Communication: Mobility status PT Visit Diagnosis: Unsteadiness on feet (R26.81);Other abnormalities of gait and mobility (R26.89);Muscle weakness (generalized) (M62.81);Difficulty in walking, not elsewhere classified (R26.2)    Time: 1610-9604 PT Time Calculation (min) (ACUTE ONLY): 31 min   Charges:   PT  Evaluation $PT Eval Low Complexity: 1 Low PT Treatments $Gait Training: 8-22 mins PT General Charges $$ ACUTE PT VISIT: 1 Visit         Johnny Bridge, PT Acute Rehab   Rebecca Stanley 09/23/2022, 12:38 PM

## 2022-09-24 LAB — CULTURE, BLOOD (ROUTINE X 2)

## 2022-09-25 LAB — CULTURE, BLOOD (ROUTINE X 2)
Culture: NO GROWTH
Culture: NO GROWTH
Special Requests: ADEQUATE

## 2022-09-27 ENCOUNTER — Ambulatory Visit (HOSPITAL_BASED_OUTPATIENT_CLINIC_OR_DEPARTMENT_OTHER): Payer: Medicare Other

## 2022-09-27 ENCOUNTER — Encounter (HOSPITAL_BASED_OUTPATIENT_CLINIC_OR_DEPARTMENT_OTHER): Payer: Self-pay | Admitting: Pulmonary Disease

## 2022-09-27 ENCOUNTER — Ambulatory Visit (HOSPITAL_BASED_OUTPATIENT_CLINIC_OR_DEPARTMENT_OTHER): Payer: Medicare Other | Admitting: Pulmonary Disease

## 2022-09-27 VITALS — BP 148/64 | HR 81 | Temp 98.2°F | Ht 61.0 in | Wt 152.0 lb

## 2022-09-27 DIAGNOSIS — J42 Unspecified chronic bronchitis: Secondary | ICD-10-CM | POA: Diagnosis not present

## 2022-09-27 DIAGNOSIS — R0602 Shortness of breath: Secondary | ICD-10-CM | POA: Diagnosis not present

## 2022-09-27 NOTE — Patient Instructions (Signed)
Acute bronchitis - on steroid taper History of severe acute bronchitis Deconditioning, post-op and recent infection --Complete steroid taper --CONTINUE Albuterol spray or nebulizer AS NEEDED for wheezing or shortness of breath --RESTART physical therapy with [facility] --Continue aerobic activity as tolerated

## 2022-09-27 NOTE — Progress Notes (Unsigned)
Subjective:   PATIENT ID: Rebecca Stanley GENDER: female DOB: 04/15/1930, MRN: [id number]   HPI  Chief Complaint  Patient presents with   Follow-up    Admitted with respiratory failure 09/20/22-09/23/22. She has had some swelling in her feet.     Reason for Visit: Follow-up  Ms. Tonjua Rossetti is a 89 year old female never smoker with mitral valve proplapse, OA s/p right TKA 2024, depression, HLD, HTN who presents for follow-up.   Initial consult Previously followed by Dr. Craige Cotta. Last seen in 02/2018 She has had bronchitis that began 5-6 weeks ago. Seen in urgent care she received antibiotic and inhaler. Did not improved and returned to urgent care for nebulizer and IM steroid and IM antibiotics. CXR neg for pneumonia. On 3/16 and 3/29 for similar symptoms. Steroids seemed to improve. She followed up her PCP. She began having cough, shortness of breath after completing steroids. Returned to urgent care for additional steroids. After completing this two weeks ago, she has had no further symptoms.  She previously had COVID in 11/2020 that was severe. But improved after Paxlovid. She reports working 15 min daily. Awaiting evaluation for knee replacement. Also has chronic back pain and currently physical therapy. Reports significant fatigue since covid.  09/22/21 Since our last visit she reports she has been sleeping in upright/reclined position with improvement in breathing. Has not tried any inhalers. Currently not having cough or wheezing.  09/27/22 Since our last visit she had a right TKA in May and then presented to the ED for for shortness of breath cough and wheezing. Found to be hypoxemic requiring 2L O2. She was treated with nebulizer including Pulmicort and lasix and steroids. Weaned off oxygen. Since discharge she reports feeling weak. She tries to walk 6 rounds around the house. She is having shortness of breath and wheezing. Using albuterol nebulizer three times a day. But son who  provides additional history states she is not active at all. Family med doctor has started prednisone taper.  Social History: Second hand smoke exposure Wood burning stove  Past Medical History:  Diagnosis Date   (HFpEF) heart failure with preserved ejection fraction (HCC)    a. 09/2020 Echo: EF 55-60%, no rwma, Gr1DD, nl RV size/fxn. RVSP 48.35mmHg. Mild LAE. Mild MR.   Allergy    Arthritis    Blood transfusion    Cancer [facility])    Colitis    Colon polyp    Depression    Diverticulosis    Heart murmur    History of cardiac catheterization    a. 05/2016 Cath ([place]): Nl cors. EF 65%. LVEDP .   History of stress test    a. 11/2018 MV: EF 55-65%, no ischemia/infarct.   Hyperlipidemia    Hypertension    Mitral valve prolapse    a. 09/2020 Echo: Mild MR.   Osteoporosis    Palpitations    a. 07/2020 reported tachycardia-->08/2020 [facility]io: RSR 78 (49-143). Rare PACs/PVCs. 3 atrial runs up to 10 beats, max 162 bpm. Triggered events = sinus rhythm.   Thyroid disease      Family History  Problem Relation Age of Onset   Heart disease Mother    Heart disease Other        aunt   Diabetes Other        grandmother   Breast cancer Other        aunt   Ovarian cancer Other        cousin  Rheum arthritis Father    Colon cancer Neg Hx      Social History   Occupational History   Occupation: retired  Tobacco Use   Smoking status: Never   Smokeless tobacco: Never  Vaping Use   Vaping status: Never Used  Substance and Sexual Activity   Alcohol use: No   Drug use: No   Sexual activity: Not on file    Allergies  Allergen Reactions   Losartan Swelling    Facial swelling   Amlodipine Swelling    Tolerates 2.5mg .    Cholestyramine Other (See Comments)   Cortisone Other (See Comments)     High blood pressure    Prednisone Other (See Comments)    High blood pressure   Tramadol     Other reaction(s): Other (See Comments), Unknown Panic attacks with other meds that she is  taking     Lisinopril Cough     Outpatient Medications Prior to Visit  Medication Sig Dispense Refill   Acetaminophen Extra Strength 500 MG TABS Take 1,000 mg by mouth in the morning and at bedtime.     albuterol (VENTOLIN HFA) 108 (90 Base) MCG/ACT inhaler Inhale 2 puffs into the lungs every 6 (six) hours as needed for wheezing or shortness of breath. 1 each 0   amitriptyline (ELAVIL) 25 MG tablet Take 25 mg by mouth at bedtime.     amLODipine (NORVASC) 2.5 MG tablet Take 2.5 mg by mouth daily.     aspirin 81 MG chewable tablet Chew 81 mg by mouth 2 (two) times daily.     Cholecalciferol (VITAMIN D PO) Take 1 tablet by mouth daily.     clonazePAM (KLONOPIN) 0.5 MG tablet Take 0.25-0.5 mg by mouth at bedtime.     cyanocobalamin 1000 MCG tablet Take 1 tablet (1,000 mcg total) by mouth daily. 30 tablet 0   DULoxetine (CYMBALTA) 20 MG capsule Take 20 mg by mouth 2 (two) times daily.     furosemide (LASIX) 20 MG tablet Take 1 tablet (20 mg total) by mouth as needed (weight gain or swelling).     GEMTESA 75 MG TABS Take 1 tablet by mouth at bedtime.     guaiFENesin (MUCINEX) 600 MG 12 hr tablet Take 1 tablet (600 mg total) by mouth 2 (two) times daily. 60 tablet 0   hydrALAZINE (APRESOLINE) 25 MG tablet Take 1 tablet (25 mg total) by mouth every 8 (eight) hours. 90 tablet 0   ipratropium-albuterol (DUONEB) 0.5-2.5 (3) MG/3ML SOLN Take 3 mLs by nebulization 3 (three) times daily. 360 mL 0   levothyroxine (SYNTHROID, LEVOTHROID) 25 MCG tablet Take 62.5 mcg by mouth daily.     metoprolol succinate (TOPROL-XL) 25 MG 24 hr tablet Take 12.5 mg by mouth at bedtime.     nitroGLYCERIN (NITROSTAT) 0.4 MG SL tablet Place 1 tablet (0.4 mg total) under the tongue every 5 (five) minutes as needed for chest pain. Maximum of 3 doses. 25 tablet 2   polyethylene glycol (MIRALAX / GLYCOLAX) 17 g packet Take 1 packet by mouth daily.     potassium chloride SA (KLOR-CON M) 10 MEQ tablet Take 1 tablet (10 mEq total) by  mouth daily as needed. When you take lasix 30 tablet 2   predniSONE (DELTASONE) 20 MG tablet Take 2 tablets (40 mg total) by mouth daily with breakfast for 5 days. 10 tablet 0   pregabalin (LYRICA) 100 MG capsule Take 100 mg by mouth daily. At 4 pm  No facility-administered medications prior to visit.    Review of Systems  Constitutional:  Positive for malaise/fatigue. Negative for chills, diaphoresis, fever and weight loss.  HENT:  Negative for congestion.   Respiratory:  Positive for shortness of breath. Negative for cough, hemoptysis, sputum production and wheezing.   Cardiovascular:  Negative for chest pain, palpitations and leg swelling.     Objective:   Vitals:   09/27/22 1327  BP: (!) 148/64  Pulse: 81  Temp: 98.2 F (36.8 C)  TempSrc: Oral  SpO2: 92%  Weight: 152 lb (68.9 kg)  Height: 5\' 1"  (1.549 m)   SpO2: 92 % (on RA) O2 Device: None (Room air)  Physical Exam: General: Elderly, well-appearing, no acute distress HENT: [place], AT Eyes: EOMI, no scleral icterus Respiratory: Clear to auscultation bilaterally.  No crackles, wheezing or rales Cardiovascular: RRR, -M/R/G, no JVD Extremities:-Edema,-tenderness Neuro: AAO x4, CNII-XII grossly intact Psych: Normal mood, normal affect   Data Reviewed:  Imaging: CT Chest 04/20/18 - stable 5mm RML nodule compared to 2018. Bibasilar atelectasis. DDD s/p kyphoplasty CXR 06/09/21 - Left basilar atelectasis CTA 09/20/22 - No PE. Visualized lung parenchyma with no pulmonary nodules, masses, infiltrate, effusion or pneumothorax. Stable RML nodule compared to 2020.  PFT: 09/22/21 FVC 1.15 (63%) FEV1 0.93 (70%) Ratio 85. No significant bronchodilator response however does not preclude benefit of therapy. Interpretation: No obstructive defect. Reduced FVC and FEV1 suggestive of poor effort vs restrictive defect.  Labs: CBC    Component Value Date/Time   WBC 13.0 (H) 09/22/2022 0359   RBC 3.93 09/22/2022 0359   HGB 11.1 (L)  09/22/2022 0359   HCT 34.8 (L) 09/22/2022 0359   PLT 395 09/22/2022 0359   MCV 88.5 09/22/2022 0359   MCH 28.2 09/22/2022 0359   MCHC 31.9 09/22/2022 0359   RDW 14.1 09/22/2022 0359   LYMPHSABS 1.4 06/09/2021 1955   MONOABS 0.9 06/09/2021 1955   EOSABS 0.0 06/09/2021 1955   BASOSABS 0.0 06/09/2021 1955      Assessment & Plan:   Discussion: 89 year old never smoker with mitral valve prolapse, OA s/p right TKA, chronic diastolic heart failure depression, HLD, HTN who presents for follow-up. Recent hospitalization for acute diastolic heart failure. Euvolemic on exam. Ambulatory O2 demonstrates no desaturation. CXR normal without infection or fluid.   Acute bronchitis - on steroid taper History of severe acute bronchitis Deconditioning, post-op and recent infection --Complete steroid taper --CONTINUE Albuterol spray or nebulizer AS NEEDED for wheezing or shortness of breath --RESTART physical therapy with [facility] --Continue aerobic activity as tolerated   Health Maintenance Immunization History  Administered Date(s) Administered   Covid-19, Mrna,Vaccine(Spikevax)99yrs and older 12/07/2021   Influenza Split 12/31/2014, 01/10/2017   Influenza, High Dose Seasonal PF 12/26/2016, 12/27/2017, 12/23/2019, 01/29/2020, 01/07/2021   Influenza, Seasonal, Injecte, Preservative Fre 12/31/2014   Influenza,inj,Quad PF,6+ Mos 01/11/2016, 12/25/2018   Influenza,inj,Quad PF,6-35 Mos 01/11/2016   Influenza-Unspecified 12/31/2014, 12/23/2019   PFIZER Comirnaty(Gray Top)Covid-19 Tri-Sucrose Vaccine 03/25/2019, 04/15/2019, 11/28/2019, 01/29/2020, 06/11/2020   PFIZER(Purple Top)SARS-COV-2 Vaccination 11/07/2019, 01/29/2020   Pneumococcal Conjugate-13 07/10/2013   Pneumococcal Polysaccharide-23 03/14/2006, 01/29/2020   Unspecified SARS-COV-2 Vaccination 01/29/2020   Zoster Recombinant(Shingrix) 08/25/2017, 10/11/2017, 01/30/2018   Zoster, Live 03/14/2009    No orders of the defined types were placed  in this encounter.  No orders of the defined types were placed in this encounter.   No follow-ups on file.   I have spent a total time of 35-minutes on the day of the appointment including chart  review, data review, collecting history, coordinating care and discussing medical diagnosis and plan with the patient/family. Past medical history, allergies, medications were reviewed. Pertinent imaging, labs and tests included in this note have been reviewed and interpreted independently by me.  Breandan People Mechele Collin, MD [facility] 09/27/2022 1:31 PM  Office Number [phone]

## 2022-09-28 ENCOUNTER — Encounter (HOSPITAL_BASED_OUTPATIENT_CLINIC_OR_DEPARTMENT_OTHER): Payer: Self-pay | Admitting: Pulmonary Disease

## 2022-10-12 ENCOUNTER — Ambulatory Visit: Payer: Medicare Other | Attending: Medical | Admitting: Medical

## 2022-10-12 ENCOUNTER — Encounter: Payer: Self-pay | Admitting: Medical

## 2022-10-12 VITALS — BP 108/58 | HR 93 | Ht 61.0 in | Wt 156.0 lb

## 2022-10-12 DIAGNOSIS — R600 Localized edema: Secondary | ICD-10-CM | POA: Insufficient documentation

## 2022-10-12 DIAGNOSIS — I5032 Chronic diastolic (congestive) heart failure: Secondary | ICD-10-CM | POA: Diagnosis present

## 2022-10-12 DIAGNOSIS — R002 Palpitations: Secondary | ICD-10-CM | POA: Insufficient documentation

## 2022-10-12 DIAGNOSIS — I10 Essential (primary) hypertension: Secondary | ICD-10-CM | POA: Insufficient documentation

## 2022-10-12 DIAGNOSIS — Z79899 Other long term (current) drug therapy: Secondary | ICD-10-CM | POA: Diagnosis present

## 2022-10-12 MED ORDER — METOPROLOL SUCCINATE ER 25 MG PO TB24: 25.0000 mg | ORAL_TABLET | Freq: Every evening | ORAL | 3 refills | Status: DC

## 2022-10-12 MED ORDER — FUROSEMIDE 20 MG PO TABS: 20.0000 mg | ORAL_TABLET | ORAL | 3 refills | Status: DC

## 2022-10-12 NOTE — Patient Instructions (Signed)
Medication Instructions:  Your physician recommends the following medication changes.  STOP TAKING: Hydralazine   INCREASE: Metoprolol 25 mg (1 tablet) by mouth daily Lasix to 20 mg (1 tablet) by mouth every other day   *If you need a refill on your cardiac medications before your next appointment, please call your pharmacy*   Lab Work: Your provider would like for you to return in 2 weeks to have the following labs drawn: (BMP).   Please go to [facility] [address] ([facility]) [address], [place] [address] You do not need an appointment.  They are open from 7:30 am-4 pm.  Lunch from 1:00 pm- 2:00 pm You will not need to be fasting.   You may also go to any of these [facility] locations:  [facility]  - [address] [employer] [address] Chief Technology Officer)   Testing/Procedures: None ordered today   Follow-Up: At [facility], you and your health needs are our priority.  As part of our continuing mission to provide you with exceptional heart care, we have created designated Provider Care Teams.  These Care Teams include your primary Cardiologist (physician) and Advanced Practice Providers (APPs -  Physician Assistants and Nurse Practitioners) who all work together to provide you with the care you need, when you need it.  We recommend signing up for the patient portal called "MyChart".  Sign up information is provided on this After Visit Summary.  MyChart is used to connect with patients for Virtual Visits (Telemedicine).  Patients are able to view lab/test results, encounter notes, upcoming appointments, etc.  Non-urgent messages can be sent to your provider as well.   To learn more about what you can do with MyChart, go to [email or web address].    Your next appointment:   2 month(s)  Provider:   You may see Yvonne Kendall, MD or one of the following Advanced Practice Providers on your designated Care Team:   Nicolasa Ducking, NP Eula Listen, PA-C Cadence Fransico Michael, PA-C Charlsie Quest, NP

## 2022-10-12 NOTE — Progress Notes (Signed)
Cardiology Office Note:    Date:  10/12/2022   ID:  Rebecca Stanley, DOB [birth year], MRN [id number]  PCP:  Gordan Payment., MD  [facility] Cardiologist:  Yvonne Kendall, MD  [facility] Electrophysiologist:  None   Referring MD: Gordan Payment., MD   Chief Complaint: 3 month follow-up  History of Present Illness:    Rebecca Stanley is a 89 y.o. female with a hx of HFpEF, mitral valve prolapse, HTN, HLD, chronic back pain who presents for 3 month follow-up.     She previously underwent diagnostic catheterization in March 2018 at [facility], showing normal coronaries with an EF of 65%.  She subsequently underwent stress testing in September 2020 showing EF 55 to 65% without ischemia or infarct.  She was recently evaluated by Dr. Okey Dupre in May of this year following hospitalization at [facility] for tachypalpitations and documented tachycardia.  Records have not been available for review.  Patient wore a Zio monitor in June of this year which did not show any significant arrhythmias.  Triggered events were associated with sinus rhythm.  Echocardiogram earlier this month showed an EF of 55 to 60%, grade 1 diastolic dysfunction, and an elevated RVSP at 48.2 mmHg.  In that setting, she was prescribed Lasix 20 mg daily as well as potassium chloride 10 mEq daily.   She was last seen June 2023 reporting weakness and fatigue.  Patient was off Lasix due to low sodium levels.  She is on hydrochlorothiazide.  Patient had lower leg edema on exam, which seem to be a chronic issue.  Echo was ordered.  Echo showed normal LVEF 60 to 65%, grade 1 diastolic dysfunction, trivial MR.   Patient was last seen in April 2024 for preop evaluation for right knee replacement.  She had been seen in the [facility] ER for elevated heart rate.  Workup in the ER was overall normal.  She was started on metoprolol.  A 2-week heart monitor was ordered.  Heart monitor showed predominantly normal sinus rhythm, rare  PACs and PVCs and several runs of SVT lasting up to 32 seconds.  Triggered events responded with normal sinus rhythm.  Patient was admitted July 2024 with acute respiratory failure with hypoxia due to acute bronchitis, hypokalemia, acute on chronic diastolic heart failure and elevated blood pressure.  Echocardiogram showed LVEF 65 to 70%, moderate LVH, grade 1 diastolic dysfunction, trivial MR. She was given IV lasix, duonebs and steroids. She was sent home with lasix 20mg  PRN.  Today, the patient reports she is not doing too well. They are concerned with fluid retention. She has not been taking lasix and has gained 2-3 lbs. Breathing is good. She denies chest pain and palpitations. Patient is not taking amlodipine. She is taking Hydralazine 25mg BID and Toprol 12.5mg  daily.   Past Medical History:  Diagnosis Date   (HFpEF) heart failure with preserved ejection fraction (HCC)    a. 09/2020 Echo: EF 55-60%, no rwma, Gr1DD, nl RV size/fxn. RVSP 48.81mmHg. Mild LAE. Mild MR.   Allergy    Arthritis    Blood transfusion    Cancer [facility])    Colitis    Colon polyp    Depression    Diverticulosis    Heart murmur    History of cardiac catheterization    a. 05/2016 Cath ([place]): Nl cors. EF 65%. LVEDP .   History of stress test    a. 11/2018 MV: EF 55-65%, no ischemia/infarct.   Hyperlipidemia  Hypertension    Mitral valve prolapse    a. 09/2020 Echo: Mild MR.   Osteoporosis    Palpitations    a. 07/2020 reported tachycardia-->08/2020 Zio: RSR 78 (49-143). Rare PACs/PVCs. 3 atrial runs up to 10 beats, max 162 bpm. Triggered events = sinus rhythm.   Thyroid disease     Past Surgical History:  Procedure Laterality Date   BELPHAROPTOSIS REPAIR     bladder tack     CATARACT EXTRACTION     COLONOSCOPY     DILATION AND CURETTAGE, DIAGNOSTIC / THERAPEUTIC     gluteous medius tendon rupture     done at [facility]   heel tendon repair     HIP ARTHROSCOPY W/ LABRAL REPAIR     kyploplasty      T12, L1, L2, L3, done at [facility]   PARTIAL HYSTERECTOMY      Current Medications: Current Meds  Medication Sig   Acetaminophen Extra Strength 500 MG TABS Take 1,000 mg by mouth in the morning and at bedtime.   albuterol (VENTOLIN HFA) 108 (90 Base) MCG/ACT inhaler Inhale 2 puffs into the lungs every 6 (six) hours as needed for wheezing or shortness of breath.   amitriptyline (ELAVIL) 25 MG tablet Take 25 mg by mouth at bedtime.   aspirin 81 MG chewable tablet Chew 81 mg by mouth 2 (two) times daily.   Cholecalciferol (VITAMIN D PO) Take 1 tablet by mouth daily.   clonazePAM (KLONOPIN) 0.5 MG tablet Take 0.25-0.5 mg by mouth at bedtime.   cyanocobalamin 1000 MCG tablet Take 1 tablet (1,000 mcg total) by mouth daily.   DULoxetine (CYMBALTA) 20 MG capsule Take 20 mg by mouth 2 (two) times daily.   GEMTESA 75 MG TABS Take 1 tablet by mouth at bedtime.   guaiFENesin (MUCINEX) 600 MG 12 hr tablet Take 1 tablet (600 mg total) by mouth 2 (two) times daily.   ipratropium-albuterol (DUONEB) 0.5-2.5 (3) MG/3ML SOLN Take 3 mLs by nebulization 3 (three) times daily.   levothyroxine (SYNTHROID, LEVOTHROID) 25 MCG tablet Take 62.5 mcg by mouth daily.   nitroGLYCERIN (NITROSTAT) 0.4 MG SL tablet Place 1 tablet (0.4 mg total) under the tongue every 5 (five) minutes as needed for chest pain. Maximum of 3 doses.   polyethylene glycol (MIRALAX / GLYCOLAX) 17 g packet Take 1 packet by mouth daily.   potassium chloride SA (KLOR-CON M) 10 MEQ tablet Take 1 tablet (10 mEq total) by mouth daily as needed. When you take lasix   pregabalin (LYRICA) 100 MG capsule Take 100 mg by mouth daily. At 4 pm   [DISCONTINUED] amLODipine (NORVASC) 2.5 MG tablet Take 2.5 mg by mouth daily.   [DISCONTINUED] furosemide (LASIX) 20 MG tablet Take 1 tablet (20 mg total) by mouth as needed (weight gain or swelling).   [DISCONTINUED] hydrALAZINE (APRESOLINE) 25 MG tablet Take 1 tablet (25 mg total) by mouth every 8 (eight) hours.    [DISCONTINUED] metoprolol succinate (TOPROL-XL) 25 MG 24 hr tablet Take 12.5 mg by mouth at bedtime.     Allergies:   Losartan, Amlodipine, Cholestyramine, Cortisone, Prednisone, Tramadol, and Lisinopril   Social History   Socioeconomic History   Marital status: Widowed    Spouse name: Not on file   Number of children: 1   Years of education: Not on file   Highest education level: Not on file  Occupational History   Occupation: retired  Tobacco Use   Smoking status: Never   Smokeless tobacco: Never  Vaping Use  Vaping status: Never Used  Substance and Sexual Activity   Alcohol use: No   Drug use: No   Sexual activity: Not on file  Other Topics Concern   Not on file  Social History Narrative   Not on file   Social Determinants of Health   Financial Resource Strain: Low Risk  (08/04/2022)   Received from [facility], [facility]   Overall Financial Resource Strain (CARDIA)    Difficulty of Paying Living Expenses: Not hard at all  Food Insecurity: No Food Insecurity (09/20/2022)   Hunger Vital Sign    Worried About Running Out of Food in the Last Year: Never true    Ran Out of Food in the Last Year: Never true  Transportation Needs: No Transportation Needs (09/20/2022)   PRAPARE - Administrator, Civil Service (Medical): No    Lack of Transportation (Non-Medical): No  Physical Activity: Insufficiently Active (07/04/2022)   Received from [facility], [facility]   Exercise Vital Sign    Days of Exercise per Week: 7 days    Minutes of Exercise per Session: 20 min  Stress: Not on file  Social Connections: Moderately Isolated (07/04/2022)   Received from [facility], [facility]   Social Connection and Isolation Panel [NHANES]    Frequency of Communication with Friends and Family: More than three times a week    Frequency of Social Gatherings with Friends and  Family: More than three times a week    Attends Religious Services: More than 4 times per year    Active Member of [employer] or Organizations: No    Attends Banker Meetings: Never    Marital Status: Widowed     Family History: The patient's family history includes Breast cancer in an other family member; Diabetes in an other family member; Heart disease in her mother and another family member; Ovarian cancer in an other family member; Rheum arthritis in her father. There is no history of Colon cancer.  ROS:   Please see the history of present illness.     All other systems reviewed and are negative.  EKGs/Labs/Other Studies Reviewed:    The following studies were reviewed today:  Echo 09/2022   1. Left ventricular ejection fraction, by estimation, is 65 to 70%. The  left ventricle has normal function. The left ventricle has no regional  wall motion abnormalities. There is moderate concentric left ventricular  hypertrophy. Left ventricular  diastolic parameters are consistent with Grade I diastolic dysfunction  (impaired relaxation).   2. Right ventricular systolic function is normal. The right ventricular  size is normal. There is normal pulmonary artery systolic pressure.   3. Left atrial size was mildly dilated.   4. The mitral valve is normal in structure. Trivial mitral valve  regurgitation. No evidence of mitral stenosis.   5. The aortic valve is normal in structure. Aortic valve regurgitation is  trivial. Aortic valve sclerosis/calcification is present, without any  evidence of aortic stenosis. Aortic valve area, by VTI measures 2.51 cm.  Aortic valve mean gradient measures   7.0 mmHg. Aortic valve Vmax measures 1.76 m/s.   6. The inferior vena cava is normal in size with greater than 50%  respiratory variability, suggesting right atrial pressure of 3 mmHg.   Echo 09/2021   1. Left ventricular ejection fraction, by estimation, is 60 to 65%. The  left ventricle  has normal function.  The left ventricle has no regional  wall motion abnormalities. Left ventricular diastolic parameters are  consistent with Grade I diastolic  dysfunction (impaired relaxation).   2. Right ventricular systolic function is normal. The right ventricular  size is normal. There is mildly elevated pulmonary artery systolic  pressure. The estimated right ventricular systolic pressure is 41.2 mmHg.   3. The mitral valve is normal in structure. Trivial mitral valve  regurgitation. No evidence of mitral stenosis.   4. The aortic valve is tricuspid. Aortic valve regurgitation is not  visualized. No aortic stenosis is present.   5. The inferior vena cava is normal in size with greater than 50%  respiratory variability, suggesting right atrial pressure of 3 mmHg.    Echo 09/2020  1. Left ventricular ejection fraction, by estimation, is 55 to 60%. The  left ventricle has normal function. The left ventricle has no regional  wall motion abnormalities. Left ventricular diastolic parameters are  consistent with Grade I diastolic  dysfunction (impaired relaxation).   2. Right ventricular systolic function is normal. The right ventricular  size is normal. There is moderately elevated pulmonary artery systolic  pressure. The estimated right ventricular systolic pressure is 48.2 mmHg.   3. Left atrial size was mildly dilated.   4. The mitral valve is normal in structure. Mild mitral valve  regurgitation.   5. The inferior vena cava is dilated in size with >50% respiratory  variability, suggesting right atrial pressure of 8 mmHg.    Heart monitor 08/2020 The patient was monitored for 13 days, 2 hours. The predominant rhythm was sinus with an average rate of 78 bpm (range 49-143 bpm and sinus). There were rare PACs and PVCs. 3 atrial runs lasting up to 10 beats with a maximum rate of 162 bpm were observed. No sustained arrhythmia or prolonged pause occurred. Patient triggered events  correspond to sinus rhythm.   Predominantly sinus rhythm with rare PACs and PVCs as well as 3 brief episodes of PSVT.  No significant arrhythmia observed to explain patient's symptoms.   Myoview Lexiscan 07/2020 Narrative & Impression  There was no ST segment deviation noted during stress. No T wave inversion was noted during stress. The study is normal. This is a low risk study. The left ventricular ejection fraction is normal (55-65%).     EKG:  EKG is not ordered today.    Recent Labs: 09/20/2022: ALT 18 09/21/2022: B Natriuretic Peptide 75.7; Magnesium 2.0; TSH 0.631 09/22/2022: Hemoglobin 11.1; Platelets 395 09/23/2022: BUN 28; Creatinine, Ser 0.63; Potassium 3.7; Sodium 134  Recent Lipid Panel    Component Value Date/Time   CHOL 306 (H) 06/12/2019 1711   TRIG 206 (H) 06/12/2019 1711   HDL 63 06/12/2019 1711   CHOLHDL 4.9 06/12/2019 1711   VLDL 41 (H) 06/12/2019 1711   LDLCALC 202 (H) 06/12/2019 1711   LDLDIRECT 188.3 (H) 06/12/2019 1711    Physical Exam:    VS:  BP (!) 108/58 (BP Location: Right Arm, Patient Position: Sitting, Cuff Size: Normal)   Pulse 93   Ht 5\' 1"  (1.549 m)   Wt 156 lb (70.8 kg)   SpO2 94%   BMI 29.48 kg/m     Wt Readings from Last 3 Encounters:  10/12/22 156 lb (70.8 kg)  09/27/22 152 lb (68.9 kg)  09/23/22 152 lb 8.9 oz (69.2 kg)     GEN:  Well nourished, well developed in no acute distress HEENT: Normal NECK: No JVD; No carotid bruits LYMPHATICS: No lymphadenopathy  CARDIAC: RRR, no murmurs, rubs, gallops RESPIRATORY:  Clear to auscultation without rales, wheezing or rhonchi  ABDOMEN: Soft, non-tender, non-distended MUSCULOSKELETAL:  1+ lower leg edema; No deformity  SKIN: Warm and dry NEUROLOGIC:  Alert and oriented x 3 PSYCHIATRIC:  Normal affect   ASSESSMENT:    1. Palpitations   2. Medication management   3. Essential hypertension   4. Leg edema   5. Chronic diastolic heart failure (HCC)    PLAN:    In order of problems  listed above:  Palpitations Heart monitor showed NSR with average HR of 79bpm, rare PACs/PVCs, 7 runs of SVT, lasting up to 31.6 seconds, prolonged pause, triggered events correspond to NSR. Patient reports no further palpitations. BP is soft today. I will stop Hydralazine and increase Toprol to 25mg  daily.   HTN BP is normal. Patient is not taking amlodipine. I will stop Hydralazine and increase Toprol as above.   Lower leg edema HFpEF Recent echo showed LVEF 65-60%, G1DD, trivial MR. During recent admission she was treated with IV lasix and sent home on lasix 20mg  PRN for lower leg swelling. Patient has not been taking lasix at home. Weight is up about 4lbs from prior visit. Continue Toprol 25mg  daily. I will start lasix 20mg  every other day, BMET in 2 weeks.  Disposition: Follow up in 2 month(s) with MD/APP    Signed, Elizabethann Lackey David Stall, PA-C  10/12/2022 4:29 PM    [facility]

## 2022-10-13 ENCOUNTER — Telehealth: Payer: Self-pay | Admitting: Medical

## 2022-10-13 MED ORDER — FUROSEMIDE 20 MG PO TABS: 20.0000 mg | ORAL_TABLET | ORAL | 0 refills | Status: DC

## 2022-10-13 NOTE — Telephone Encounter (Signed)
*  STAT* If patient is at the pharmacy, call can be transferred to refill team.   1. Which medications need to be refilled? (please list name of each medication and dose if known)   furosemide (LASIX) 20 MG tablet       4. Which pharmacy/location (including street and city if local pharmacy) is medication to be sent to? [facility] [phone] - [place], [place] - [address] AT [facility] [address] Phone: [phone]  Fax: [phone]       5. Do they need a 30 day or 90 day supply? 14 day supply, until [facility] rx is received.

## 2022-10-25 ENCOUNTER — Other Ambulatory Visit: Payer: Self-pay | Admitting: Medical

## 2022-12-15 ENCOUNTER — Ambulatory Visit: Payer: Medicare Other | Admitting: Medical

## 2022-12-16 ENCOUNTER — Encounter (HOSPITAL_BASED_OUTPATIENT_CLINIC_OR_DEPARTMENT_OTHER): Payer: Self-pay

## 2022-12-16 ENCOUNTER — Emergency Department (HOSPITAL_BASED_OUTPATIENT_CLINIC_OR_DEPARTMENT_OTHER)
Admission: EM | Admit: 2022-12-16 | Discharge: 2022-12-16 | Disposition: A | Discharge status: Home / Self Care | Payer: Medicare Other | Attending: Emergency Medicine | Admitting: Emergency Medicine

## 2022-12-16 ENCOUNTER — Other Ambulatory Visit: Payer: Self-pay

## 2022-12-16 ENCOUNTER — Emergency Department (HOSPITAL_BASED_OUTPATIENT_CLINIC_OR_DEPARTMENT_OTHER): Payer: Medicare Other

## 2022-12-16 DIAGNOSIS — I509 Heart failure, unspecified: Secondary | ICD-10-CM | POA: Insufficient documentation

## 2022-12-16 DIAGNOSIS — R002 Palpitations: Secondary | ICD-10-CM | POA: Diagnosis not present

## 2022-12-16 DIAGNOSIS — Z7982 Long term (current) use of aspirin: Secondary | ICD-10-CM | POA: Diagnosis not present

## 2022-12-16 DIAGNOSIS — Z79899 Other long term (current) drug therapy: Secondary | ICD-10-CM | POA: Insufficient documentation

## 2022-12-16 DIAGNOSIS — R000 Tachycardia, unspecified: Secondary | ICD-10-CM | POA: Diagnosis present

## 2022-12-16 DIAGNOSIS — I110 Hypertensive heart disease with heart failure: Secondary | ICD-10-CM | POA: Insufficient documentation

## 2022-12-16 LAB — CBC
HCT: 42.9 % (ref 36.0–46.0)
Hemoglobin: 13.7 g/dL (ref 12.0–15.0)
MCH: 26.6 pg (ref 26.0–34.0)
MCHC: 31.9 g/dL (ref 30.0–36.0)
MCV: 83.1 fL (ref 80.0–100.0)
Platelets: 361 10*3/uL (ref 150–400)
RBC: 5.16 MIL/uL — ABNORMAL HIGH (ref 3.87–5.11)
RDW: 15.6 % — ABNORMAL HIGH (ref 11.5–15.5)
WBC: 7.1 10*3/uL (ref 4.0–10.5)
nRBC: 0 % (ref 0.0–0.2)

## 2022-12-16 LAB — BASIC METABOLIC PANEL
Anion gap: 12 (ref 5–15)
BUN: 26 mg/dL — ABNORMAL HIGH (ref 8–23)
CO2: 29 mmol/L (ref 22–32)
Calcium: 9.8 mg/dL (ref 8.9–10.3)
Chloride: 97 mmol/L — ABNORMAL LOW (ref 98–111)
Creatinine, Ser: 0.77 mg/dL (ref 0.44–1.00)
GFR, Estimated: >60 mL/min (ref >60)
Glucose, Bld: 114 mg/dL — ABNORMAL HIGH (ref 70–99)
Potassium: 3.3 mmol/L — ABNORMAL LOW (ref 3.5–5.1)
Sodium: 138 mmol/L (ref 135–145)

## 2022-12-16 LAB — MAGNESIUM: Magnesium: 2 mg/dL (ref 1.7–2.4)

## 2022-12-16 LAB — BRAIN NATRIURETIC PEPTIDE: B Natriuretic Peptide: 40.5 pg/mL (ref 0.0–100.0)

## 2022-12-16 LAB — TROPONIN I (HIGH SENSITIVITY): Troponin I (High Sensitivity): 11 ng/L (ref <18)

## 2022-12-16 MED ORDER — DILTIAZEM HCL 30 MG PO TABS: 30.0000 mg | ORAL_TABLET | Freq: Four times a day (QID) | ORAL | 0 refills | Status: AC | PRN

## 2022-12-16 MED ORDER — POTASSIUM CHLORIDE CRYS ER 20 MEQ PO TBCR
40.0000 meq | EXTENDED_RELEASE_TABLET | Freq: Once | ORAL | Status: AC
  Administered 2022-12-16: 40 meq via ORAL
  Filled 2022-12-16: qty 2

## 2022-12-16 MED ORDER — DILTIAZEM HCL-DEXTROSE 125-5 MG/125ML-% IV SOLN (PREMIX)
5.0000 mg/h | INTRAVENOUS | Status: DC
  Administered 2022-12-16: 5 mg/h via INTRAVENOUS
  Filled 2022-12-16: qty 125

## 2022-12-16 MED ORDER — SODIUM CHLORIDE 0.9 % IV BOLUS
500.0000 mL | Freq: Once | INTRAVENOUS | Status: AC
  Administered 2022-12-16: 500 mL via INTRAVENOUS

## 2022-12-16 MED ORDER — DILTIAZEM LOAD VIA INFUSION
10.0000 mg | Freq: Once | INTRAVENOUS | Status: AC
  Administered 2022-12-16: 10 mg via INTRAVENOUS
  Filled 2022-12-16: qty 10

## 2022-12-16 NOTE — ED Triage Notes (Signed)
Pt here for feeling like her "heart is flying"  her son states that her HR was 160-170 at home and she took a dose of metoprolol.  HR 143 in triage

## 2022-12-16 NOTE — ED Provider Notes (Signed)
Rebecca Stanley Provider Note   CSN: [id number] Arrival date & time: 12/16/22  2013     History  Chief Complaint  Patient presents with   Tachycardia    Rebecca Stanley is a 89 y.o. female.  The history is provided by the patient and medical records. No language interpreter was used.  Palpitations Palpitations quality:  Fast Onset quality:  Gradual Duration:  3 days Timing:  Intermittent Progression:  Worsening Chronicity:  Recurrent Relieved by:  Nothing Worsened by:  Nothing Ineffective treatments:  Beta blockers Associated symptoms: lower extremity edema (chronic and unchanged) and malaise/fatigue   Associated symptoms: no back pain, no chest pain, no chest pressure, no cough, no dizziness, no hemoptysis, no nausea, no near-syncope, no numbness, no shortness of breath, no syncope, no vomiting and no weakness   Risk factors: no hx of PE        Home Medications Prior to Admission medications   Medication Sig Start Date End Date Taking? Authorizing Provider  Acetaminophen Extra Strength 500 MG TABS Take 1,000 mg by mouth in the morning and at bedtime. 08/09/22   [provider]  albuterol (VENTOLIN HFA) 108 (90 Base) MCG/ACT inhaler Inhale 2 puffs into the lungs every 6 (six) hours as needed for wheezing or shortness of breath. 09/23/22   Regalado, Belkys A, MD  amitriptyline (ELAVIL) 25 MG tablet Take 25 mg by mouth at bedtime.    [provider]  aspirin 81 MG chewable tablet Chew 81 mg by mouth 2 (two) times daily. 08/09/22   [provider]  Cholecalciferol (VITAMIN D PO) Take 1 tablet by mouth daily.    [provider]  clonazePAM (KLONOPIN) 0.5 MG tablet Take 0.25-0.5 mg by mouth at bedtime.    [provider]  cyanocobalamin 1000 MCG tablet Take 1 tablet (1,000 mcg total) by mouth daily. 09/24/22   Regalado, Belkys A, MD  DULoxetine (CYMBALTA) 20 MG capsule Take 20 mg by mouth 2 (two)  times daily. 05/13/20   [provider]  furosemide (LASIX) 20 MG tablet TAKE 1 TABLET(20 MG) BY MOUTH EVERY OTHER DAY 10/25/22   End, Cristal Deer, MD  GEMTESA 75 MG TABS Take 1 tablet by mouth at bedtime.    [provider]  ipratropium-albuterol (DUONEB) 0.5-2.5 (3) MG/3ML SOLN Take 3 mLs by nebulization 3 (three) times daily. 09/23/22   Regalado, Belkys A, MD  levothyroxine (SYNTHROID, LEVOTHROID) 25 MCG tablet Take 62.5 mcg by mouth daily.    [provider]  metoprolol succinate (TOPROL-XL) 25 MG 24 hr tablet Take 1 tablet (25 mg total) by mouth at bedtime. 10/12/22   Furth, Cadence H, PA-C  nitroGLYCERIN (NITROSTAT) 0.4 MG SL tablet Place 1 tablet (0.4 mg total) under the tongue every 5 (five) minutes as needed for chest pain. Maximum of 3 doses. 08/06/20   End, Cristal Deer, MD  polyethylene glycol (MIRALAX / GLYCOLAX) 17 g packet Take 1 packet by mouth daily. 08/09/22   [provider]  potassium chloride SA (KLOR-CON M) 10 MEQ tablet Take 1 tablet (10 mEq total) by mouth daily as needed. When you take lasix 07/07/22 10/12/22  Furth, Cadence H, PA-C  pregabalin (LYRICA) 100 MG capsule Take 100 mg by mouth daily. At 4 pm    [provider]      Allergies    Losartan, Amlodipine, Cholestyramine, Cortisone, Prednisone, Tramadol, and Lisinopril    Review of Systems   Review of Systems  Constitutional:  Positive for malaise/fatigue. Negative for chills, fatigue and fever.  HENT:  Negative for congestion.   Respiratory:  Negative for cough, hemoptysis, chest tightness and shortness of breath.   Cardiovascular:  Positive for palpitations. Negative for chest pain, syncope and near-syncope.  Gastrointestinal:  Negative for abdominal pain, nausea and vomiting.  Genitourinary:  Negative for dysuria and flank pain.  Musculoskeletal:  Negative for back pain.  Skin:  Negative for rash.  Neurological:  Positive for light-headedness. Negative for dizziness, speech  difficulty, weakness, numbness and headaches.  Psychiatric/Behavioral:  Negative for agitation and confusion.   All other systems reviewed and are negative.   Physical Exam Updated Vital Signs BP (!) 179/101   Pulse (!) 143   Temp (!) 97.4 F (36.3 C)   Resp 20   SpO2 93%  Physical Exam Vitals and nursing note reviewed.  Constitutional:      General: She is not in acute distress.    Appearance: She is well-developed. She is not ill-appearing, toxic-appearing or diaphoretic.  HENT:     Head: Normocephalic and atraumatic.     Nose: No congestion or rhinorrhea.     Mouth/Throat:     Mouth: Mucous membranes are moist.     Pharynx: No oropharyngeal exudate or posterior oropharyngeal erythema.  Eyes:     Extraocular Movements: Extraocular movements intact.     Conjunctiva/sclera: Conjunctivae normal.     Pupils: Pupils are equal, round, and reactive to light.  Cardiovascular:     Rate and Rhythm: Regular rhythm. Tachycardia present.     Heart sounds: Murmur heard.  Pulmonary:     Effort: Pulmonary effort is normal. No respiratory distress.     Breath sounds: Normal breath sounds. No wheezing, rhonchi or rales.  Chest:     Chest wall: No tenderness.  Abdominal:     General: Abdomen is flat.     Palpations: Abdomen is soft.     Tenderness: There is no abdominal tenderness. There is no right CVA tenderness, left CVA tenderness, guarding or rebound.  Musculoskeletal:        General: No swelling or tenderness.     Cervical back: Neck supple. No tenderness.     Right lower leg: Edema present.     Left lower leg: Edema present.  Skin:    General: Skin is warm and dry.     Capillary Refill: Capillary refill takes less than 2 seconds.     Findings: No erythema.  Neurological:     General: No focal deficit present.     Mental Status: She is alert.     Sensory: No sensory deficit.     Motor: No weakness.  Psychiatric:        Mood and Affect: Mood normal.     ED Results /  Procedures / Treatments   Labs (all labs ordered are listed, but only abnormal results are displayed) Labs Reviewed  BASIC METABOLIC PANEL - Abnormal; Notable for the following components:      Result Value   Potassium 3.3 (*)    Chloride 97 (*)    Glucose, Bld 114 (*)    BUN 26 (*)    All other components within normal limits  CBC - Abnormal; Notable for the following components:   RBC 5.16 (*)    RDW 15.6 (*)    All other components within normal limits  MAGNESIUM  BRAIN NATRIURETIC PEPTIDE  TSH  TROPONIN I (HIGH SENSITIVITY)    EKG EKG Interpretation Date/Time:  Friday December 16 2022 20:33:53 EDT Ventricular Rate:  139 PR Interval:  60 QRS Duration:  123 QT Interval:  350 QTC Calculation: 533 R Axis:   113  Text Interpretation: Ectopic atrial tachycardia, unifocal RBBB and LPFB when compared to prior, still tachycardia. No STEMI per cards Confirmed by Theda Belfast ([id number]) on 12/16/2022 8:58:56 PM  Radiology DG Chest Port 1 View  Result Date: 12/16/2022 CLINICAL DATA:  Tachycardia. EXAM: PORTABLE CHEST 1 VIEW COMPARISON:  09/27/2022 FINDINGS: Normal heart size. Aortic atherosclerosis and tortuosity. Subsegmental scarring at the left lung base. No acute airspace disease. No pulmonary edema. No pleural effusion or pneumothorax. On limited assessment, no acute osseous findings. IMPRESSION: No acute chest findings. Subsegmental scarring at the left lung base. Electronically Signed   By: Narda Rutherford M.D.   On: 12/16/2022 22:36    Procedures Procedures    Medications Ordered in ED Medications  diltiazem (CARDIZEM) 1 mg/mL load via infusion 10 mg (10 mg Intravenous Bolus from Bag 12/16/22 2100)    And  diltiazem (CARDIZEM) 125 mg in dextrose 5% 125 mL (1 mg/mL) infusion (0 mg/hr Intravenous Stopped 12/16/22 2110)  sodium chloride 0.9 % bolus 500 mL (0 mLs Intravenous Stopped 12/16/22 2137)  potassium chloride SA (KLOR-CON M) CR tablet 40 mEq (40 mEq Oral Given 12/16/22  2101)    ED Course/ Medical Decision Making/ A&P                                 Medical Decision Making Amount and/or Complexity of Data Reviewed Labs: ordered. Radiology: ordered.  Risk Prescription drug management.     Rebecca Stanley is a 89 y.o. female with a past medical history significant for heart failure, thyroid disease, hyperlipidemia, hypertension, diverticulosis, previous tachycardia, and osteoporosis who presents with palpitations and fast heart rate.  According to patient, for the last few days she has had some fatigue and occasional chills as well as intermittent palpitations but this evening after dinner started having more sustained fast palpitations and fast heart rate.  She reports she checked her heart rate with her pulse oximeter and it was found to be in the 180s.  She felt like her heart was racing and she was concerned although she denied significant chest pain or shortness of breath.  She did feel fatigued and little bit lightheaded but did not pass out.  She has had this in the past and has had workup with cardiology and took an extra metoprolol before coming here without resolution.  On exam, lungs clear.  Chest nontender.  Abdomen nontender.  Slight murmur but she is tachycardic with rates in the 150s.  Legs are slightly edematous but she reports it is unchanged from her baseline.  Intact pulses in extremities.  No focal neurologic deficits initially.  Patient appears anxious about her tachycardia.  Slightly dry mucous membranes.  EKG shows tachycardia but is unclear if it is an atrial tachycardia, SVT, or an arrhythmia like a flutter with RVR.  I called and spoke to Dr. Anne Fu with cardiology who reviewed the EKG and he agrees this is not a STEMI.  He suspect this is either an atrial tachycardia versus flutter.  He recommended a Dilt bolus and drip and some fluids and getting labs.  If she converts and is feeling better he thought she might be stable for discharge  home however if she remains in arrhythmia or tachycardic she would likely  need admission.  Shortly after getting the fluid bolus of 500 cc normal saline and the diltiazem bolus, she converted to sinus rhythm and has remained in the 60s ever since.  She reports she is feeling much better but still does not feel completely back to her baseline.  Reports feeling somewhat fatigued.  Workup continues to return, metabolic panel showed slight hypokalemia with potassium of 3.3, oral potassium ordered.  BNP not elevated.  Magnesium normal.  Initial troponin negative, CBC shows no leukocytosis or anemia.  Waiting on results of chest x-ray and other labs however now that she is in sinus rhythm, anticipate reassessment and discussion again with cardiology about disposition.  Spoke again to cardiology who felt she was ready for discharge home given her stability and resolution of symptoms.  They requested we give for prescription for 30 mg diltiazem to use as a rescue medication and follow-up with her cardiology team this week.  Patient and family agreed and patient was discharged in good condition.         Final Clinical Impression(s) / ED Diagnoses Final diagnoses:  Tachycardia  Palpitations    Rx / DC Orders ED Discharge Orders          Ordered    diltiazem (CARDIZEM) 30 MG tablet  4 times daily PRN        12/16/22 2242           Clinical Impression: 1. Tachycardia   2. Palpitations     Disposition: Discharge  Condition: Good  I have discussed the results, Dx and Tx plan with the pt(& family if present). He/she/they expressed understanding and agree(s) with the plan. Discharge instructions discussed at great length. Strict return precautions discussed and pt &/or family have verbalized understanding of the instructions. No further questions at time of discharge.    Discharge Medication List as of 12/16/2022 10:46 PM     START taking these medications   Details  diltiazem  (CARDIZEM) 30 MG tablet Take 1 tablet (30 mg total) by mouth 4 (four) times daily as needed (for tachycardia and palpitations that do not improve with regular meds, fluids, and rest)., Starting Fri 12/16/2022, Print        Follow Up: Gordan Payment., MD [address] [place] [address] [phone]     your cardiology team        Consuela Widener, Canary Brim, MD 12/16/22 [phone]

## 2022-12-16 NOTE — Discharge Instructions (Signed)
Your history, exam, and evaluation today are consistent with palpitations related to the tachycardia that you experience.  We were able to get you out of that rhythm with fluids and the diltiazem and your workup otherwise was reassuring.  I spoke with cardiology several times who felt you were safe for discharge home to follow-up with your cardiology team this week.  They did want me to print a prescription for the 30 mg diltiazem to use as a rescue medicine if your home medicine is not helping and your palpitations return.  Please rest and stay hydrated and see your primary team this week.  If any symptoms change or worsen acutely and do not improve with medications, return to the nearest emergency department.

## 2022-12-17 LAB — TSH: TSH: 3.172 u[IU]/mL (ref 0.350–4.500)

## 2022-12-30 ENCOUNTER — Ambulatory Visit: Payer: Medicare Other

## 2022-12-30 ENCOUNTER — Encounter: Payer: Self-pay | Admitting: Medical

## 2022-12-30 ENCOUNTER — Ambulatory Visit: Payer: Medicare Other | Attending: Medical | Admitting: Medical

## 2022-12-30 VITALS — HR 59 | Ht 61.0 in | Wt 155.4 lb

## 2022-12-30 DIAGNOSIS — I471 Supraventricular tachycardia, unspecified: Secondary | ICD-10-CM | POA: Diagnosis present

## 2022-12-30 DIAGNOSIS — R000 Tachycardia, unspecified: Secondary | ICD-10-CM | POA: Diagnosis present

## 2022-12-30 DIAGNOSIS — E876 Hypokalemia: Secondary | ICD-10-CM | POA: Insufficient documentation

## 2022-12-30 DIAGNOSIS — I5032 Chronic diastolic (congestive) heart failure: Secondary | ICD-10-CM | POA: Diagnosis present

## 2022-12-30 NOTE — Progress Notes (Unsigned)
Cardiology Office Note:    Date:  01/02/2023   ID:  BRADFORD HEID, DOB 26-Nov-1930, MRN [id number]  PCP:  Gordan Payment., MD  [facility] Cardiologist:  Yvonne Kendall, MD  [facility] Electrophysiologist:  None   Referring MD: Gordan Payment., MD   Chief Complaint: ER follow-up  History of Present Illness:    Rebecca Stanley is a 89 y.o. female with a hx of HFpEF, mitral valve prolapse, HTN, HLD, chronic back pain who presents for 3 month follow-up.     She previously underwent diagnostic catheterization in March 2018 at [facility], showing normal coronaries with an EF of 65%.  She subsequently underwent stress testing in September 2020 showing EF 55 to 65% without ischemia or infarct.  She was recently evaluated by Dr. Okey Dupre in May of this year following hospitalization at [facility] for tachypalpitations and documented tachycardia.  Records have not been available for review.  Patient wore a Zio monitor in June of this year which did not show any significant arrhythmias.  Triggered events were associated with sinus rhythm.  Echocardiogram earlier this month showed an EF of 55 to 60%, grade 1 diastolic dysfunction, and an elevated RVSP at 48.2 mmHg.  In that setting, she was prescribed Lasix 20 mg daily as well as potassium chloride 10 mEq daily.   She was last seen June 2023 reporting weakness and fatigue.  Patient was off Lasix due to low sodium levels.  She is on hydrochlorothiazide.  Patient had lower leg edema on exam, which seem to be a chronic issue.  Echo was ordered.  Echo showed normal LVEF 60 to 65%, grade 1 diastolic dysfunction, trivial MR.   Patient was last seen in April 2024 for preop evaluation for right knee replacement.  She had been seen in the [facility] ER for elevated heart rate.  Workup in the ER was overall normal.  She was started on metoprolol.  A 2-week heart monitor was ordered.  Heart monitor showed predominantly normal sinus rhythm, rare PACs  and PVCs and several runs of SVT lasting up to 32 seconds.  Triggered events responded with normal sinus rhythm.   Patient was admitted July 2024 with acute respiratory failure with hypoxia due to acute bronchitis, hypokalemia, acute on chronic diastolic heart failure and elevated blood pressure.  Echocardiogram showed LVEF 65 to 70%, moderate LVH, grade 1 diastolic dysfunction, trivial MR. She was given IV lasix, duonebs and steroids. She was sent home with lasix 20mg  PRN.  The patient was last seen 10/12/22 and was concerned with retaining fluid. The patient was started on lasix 20mg  daily.   The patient was seen in the ER 10/4 for tachycardia. EKG showed atrial tachycardia vs aflutter. Cardiology was consulted who recommended a dilt bolus. The patient was given IVF and IV dilt and converted to NSR. She was discharged home.   Today, the patient reports before the ER visit she went to eat and said her heart was flying. HR was 160s. She felt her heart racing and overall felt poorly. She was given metoprolol prior to arriving, but without much improvement. In the ER, the patient converted quickly with IV dilt. EKG reviewed, unclear rhythm. Possible ?SVT. She denies further tachycardiac episodes. She has been taking Toprol 12.5mg  daily. She takes lasix every other day. She has left lower leg edema, she had a knee replacement on the left side in May 2024 and has had swelling since then. I said she can take  an extra lasix to see if that helps. In the ER, K was low so we will re-check this. She needs potassium refilled.   Past Medical History:  Diagnosis Date   (HFpEF) heart failure with preserved ejection fraction (HCC)    a. 09/2020 Echo: EF 55-60%, no rwma, Gr1DD, nl RV size/fxn. RVSP 48.68mmHg. Mild LAE. Mild MR.   Allergy    Arthritis    Blood transfusion    Cancer [facility])    Colitis    Colon polyp    Depression    Diverticulosis    Heart murmur    History of cardiac catheterization    a. 05/2016  Cath ([place]): Nl cors. EF 65%. LVEDP .   History of stress test    a. 11/2018 MV: EF 55-65%, no ischemia/infarct.   Hyperlipidemia    Hypertension    Mitral valve prolapse    a. 09/2020 Echo: Mild MR.   Osteoporosis    Palpitations    a. 07/2020 reported tachycardia-->08/2020 Zio: RSR 78 (49-143). Rare PACs/PVCs. 3 atrial runs up to 10 beats, max 162 bpm. Triggered events = sinus rhythm.   Thyroid disease     Past Surgical History:  Procedure Laterality Date   BELPHAROPTOSIS REPAIR     bladder tack     CATARACT EXTRACTION     COLONOSCOPY     DILATION AND CURETTAGE, DIAGNOSTIC / THERAPEUTIC     gluteous medius tendon rupture     done at [facility]   heel tendon repair     HIP ARTHROSCOPY W/ LABRAL REPAIR     kyploplasty     T12, L1, L2, L3, done at [facility]   PARTIAL HYSTERECTOMY      Current Medications: Current Meds  Medication Sig   Acetaminophen Extra Strength 500 MG TABS Take 1,000 mg by mouth in the morning and at bedtime.   albuterol (VENTOLIN HFA) 108 (90 Base) MCG/ACT inhaler Inhale 2 puffs into the lungs every 6 (six) hours as needed for wheezing or shortness of breath.   amitriptyline (ELAVIL) 25 MG tablet Take 25 mg by mouth at bedtime.   aspirin 81 MG chewable tablet Chew 81 mg by mouth 2 (two) times daily.   Cholecalciferol (VITAMIN D PO) Take 1 tablet by mouth daily.   clonazePAM (KLONOPIN) 0.5 MG tablet Take 0.25-0.5 mg by mouth at bedtime.   cyanocobalamin 1000 MCG tablet Take 1 tablet (1,000 mcg total) by mouth daily.   diltiazem (CARDIZEM) 30 MG tablet Take 1 tablet (30 mg total) by mouth 4 (four) times daily as needed (for tachycardia and palpitations that do not improve with regular meds, fluids, and rest).   DULoxetine (CYMBALTA) 20 MG capsule Take 20 mg by mouth 2 (two) times daily.   furosemide (LASIX) 20 MG tablet TAKE 1 TABLET(20 MG) BY MOUTH EVERY OTHER DAY   GEMTESA 75 MG TABS Take 1 tablet by mouth at bedtime.   ipratropium-albuterol (DUONEB) 0.5-2.5  (3) MG/3ML SOLN Take 3 mLs by nebulization 3 (three) times daily.   levothyroxine (SYNTHROID, LEVOTHROID) 25 MCG tablet Take 62.5 mcg by mouth daily.   metoprolol succinate (TOPROL-XL) 25 MG 24 hr tablet Take 1 tablet (25 mg total) by mouth at bedtime.   nitroGLYCERIN (NITROSTAT) 0.4 MG SL tablet Place 1 tablet (0.4 mg total) under the tongue every 5 (five) minutes as needed for chest pain. Maximum of 3 doses.   polyethylene glycol (MIRALAX / GLYCOLAX) 17 g packet Take 1 packet by mouth daily.   pregabalin (  LYRICA) 100 MG capsule Take 100 mg by mouth daily. At 4 pm     Allergies:   Losartan, Amlodipine, Cholestyramine, Cortisone, Prednisone, Tramadol, and Lisinopril   Social History   Socioeconomic History   Marital status: Widowed    Spouse name: Not on file   Number of children: 1   Years of education: Not on file   Highest education level: Not on file  Occupational History   Occupation: retired  Tobacco Use   Smoking status: Never   Smokeless tobacco: Never  Vaping Use   Vaping status: Never Used  Substance and Sexual Activity   Alcohol use: No   Drug use: No   Sexual activity: Not on file  Other Topics Concern   Not on file  Social History Narrative   Not on file   Social Determinants of Health   Financial Resource Strain: Low Risk  (10/17/2022)   Received [facility]   Overall Financial Resource Strain (CARDIA)    Difficulty of Paying Living Expenses: Not hard at all  Food Insecurity: No Food Insecurity (10/17/2022)   Received from [facility]   Hunger Vital Sign    Worried About Running Out of Food in the Last Year: Never true    Ran Out of Food in the Last Year: Never true  Transportation Needs: No Transportation Needs (10/17/2022)   Received from [facility]    In the past 12 months, has lack of transportation kept you from medical appointments or from getting medications?: No     Lack of Transportation (Non-Medical): No  Physical Activity: Insufficiently Active (07/04/2022)   Received from [facility], [facility]   Exercise Vital Sign    Days of Exercise per Week: 7 days    Minutes of Exercise per Session: 20 min  Stress: Not on file  Social Connections: Moderately Isolated (07/04/2022)   Received from [facility], [facility]   Social Connection and Isolation Panel [NHANES]    Frequency of Communication with Friends and Family: More than three times a week    Frequency of Social Gatherings with Friends and Family: More than three times a week    Attends Religious Services: More than 4 times per year    Active Member of [employer] or Organizations: No    Attends Banker Meetings: Never    Marital Status: Widowed     Family History: The patient's family history includes Breast cancer in an other family member; Diabetes in an other family member; Heart disease in her mother and another family member; Ovarian cancer in an other family member; Rheum arthritis in her father. There is no history of Colon cancer.  ROS:   Please see the history of present illness.     All other systems reviewed and are negative.  EKGs/Labs/Other Studies Reviewed:    The following studies were reviewed today:  Echo 09/2022  1. Left ventricular ejection fraction, by estimation, is 65 to 70%. The  left ventricle has normal function. The left ventricle has no regional  wall motion abnormalities. There is moderate concentric left ventricular  hypertrophy. Left ventricular  diastolic parameters are consistent with Grade I diastolic dysfunction  (impaired relaxation).   2. Right ventricular systolic function is normal. The right ventricular  size is normal. There is normal pulmonary artery systolic pressure.   3. Left atrial size was mildly dilated.  4. The mitral valve is normal in structure. Trivial  mitral valve  regurgitation. No evidence of mitral stenosis.   5. The aortic valve is normal in structure. Aortic valve regurgitation is  trivial. Aortic valve sclerosis/calcification is present, without any  evidence of aortic stenosis. Aortic valve area, by VTI measures 2.51 cm.  Aortic valve mean gradient measures   7.0 mmHg. Aortic valve Vmax measures 1.76 m/s.   6. The inferior vena cava is normal in size with greater than 50%  respiratory variability, suggesting right atrial pressure of 3 mmHg.   Heart monitor 07/2022    The patient was monitored for 13 days, 20 hours.   The predominant rhythm was sinus with an average rate of 79 bpm (range 49-123 bpm in sinus).   There were rare PAC's and PVC's.   7 supraventricular runs were observed, lasting up to 31.6 seconds with a maximum rate of 174 bpm.   No prolonged pause occurred.   Patient triggered events correspond to normal sinus rhythm.   Predominantly sinus rhythm with rare PAC's and PVC's.  Several runs of PSVT noted, lasting up to 32 seconds.    EKG:  EKG is ordered today.  The ekg ordered today demonstrates SB 59bpm, no changes  Recent Labs: 09/20/2022: ALT 18 12/16/2022: B Natriuretic Peptide 40.5; Hemoglobin 13.7; Magnesium 2.0; Platelets 361; TSH 3.172 12/30/2022: BUN 15; Creatinine, Ser 0.87; Potassium 4.3; Sodium 139  Recent Lipid Panel    Component Value Date/Time   CHOL 306 (H) 06/12/2019 1711   TRIG 206 (H) 06/12/2019 1711   HDL 63 06/12/2019 1711   CHOLHDL 4.9 06/12/2019 1711   VLDL 41 (H) 06/12/2019 1711   LDLCALC 202 (H) 06/12/2019 1711   LDLDIRECT 188.3 (H) 06/12/2019 1711    Physical Exam:    VS:  Pulse (!) 59   Ht 5\' 1"  (1.549 m)   Wt 155 lb 6.4 oz (70.5 kg)   SpO2 94%   BMI 29.36 kg/m     Wt Readings from Last 3 Encounters:  12/30/22 155 lb 6.4 oz (70.5 kg)  10/12/22 156 lb (70.8 kg)  09/27/22 152 lb (68.9 kg)     GEN:  Well nourished, well developed in no acute distress HEENT:  Normal NECK: No JVD; No carotid bruits LYMPHATICS: No lymphadenopathy CARDIAC: RRR, no murmurs, rubs, gallops RESPIRATORY:  Clear to auscultation without rales, wheezing or rhonchi  ABDOMEN: Soft, non-tender, non-distended MUSCULOSKELETAL:  No edema; No deformity  SKIN: Warm and dry NEUROLOGIC:  Alert and oriented x 3 PSYCHIATRIC:  Normal affect   ASSESSMENT:    1. Tachyarrhythmia   2. Paroxysmal SVT (supraventricular tachycardia) (HCC)   3. Hypokalemia   4. Chronic diastolic heart failure (HCC)    PLAN:    In order of problems listed above:  Tachyarrhythmia pSVT Recent ER visit for symptomatic SVT vs aflutter vs atrial tachycardia. EKG reviewed by cardiology at the time, but rhythm was unclear. The patient converted to NSR IV dilt in the ER. She is in NSR today and denies any further tachycardic episodes. She has been taking Toprol 12.5mg  daily. She has dilt 30mg  to take as needed. EKG from the ER reviewed by myself and MD, unclear rhythm, but suspect SVT. Prior heart monitor showed 7 runs of SVT, longest 31 seconds. I will repeat a heart monitor. Recent labs reviewed. Cannot increase Toprol given HR 59bpm.  Hypokalemia K 3.3 in the ER. Repeat BMET today. She is on potassium with lasix.   HFpEF She  has unilateral swelling on the left since her left knee replacement in May 2024. She takes lasix 20mg  3-4 times weekly. They can try to take it for 3 days in a row to see if swelling improves. Recent BNP was normal, which is overall reassuring. Recommend leg elevation, low salt and compression socks.   Disposition: Follow up in 1 month(s) with MD/APP   Signed, Rebecca Losey David Stall, PA-C  01/02/2023 10:34 AM    [facility]

## 2022-12-30 NOTE — Patient Instructions (Signed)
Medication Instructions:  No changes *If you need a refill on your cardiac medications before your next appointment, please call your pharmacy*   Lab Work: Your provider would like for you to have following labs drawn today BMET.   If you have labs (blood work) drawn today and your tests are completely normal, you will receive your results only by: MyChart Message (if you have MyChart) OR A paper copy in the mail If you have any lab test that is abnormal or we need to change your treatment, we will call you to review the results.   Testing/Procedures: None ordered   Follow-Up: At [facility], you and your health needs are our priority.  As part of our continuing mission to provide you with exceptional heart care, we have created designated Provider Care Teams.  These Care Teams include your primary Cardiologist (physician) and Advanced Practice Providers (APPs -  Physician Assistants and Nurse Practitioners) who all work together to provide you with the care you need, when you need it.  We recommend signing up for the patient portal called "MyChart".  Sign up information is provided on this After Visit Summary.  MyChart is used to connect with patients for Virtual Visits (Telemedicine).  Patients are able to view lab/test results, encounter notes, upcoming appointments, etc.  Non-urgent messages can be sent to your provider as well.   To learn more about what you can do with MyChart, go to [email or web address].    Your next appointment:   3 month(s)  Provider:   You may see Yvonne Kendall, MD or one of the following Advanced Practice Providers on your designated Care Team:   Cadence Fransico Michael, [place]  A referral has been placed to EP.   Other Instructions ZIO XT- Long Term Monitor Instructions  Your physician has requested you wear a ZIO patch monitor for 14 days.  This is a single patch monitor. [employer] supplies one patch monitor per enrollment. Additional stickers are  not available. Please do not apply patch if you will be having a Nuclear Stress Test,  Echocardiogram, Cardiac CT, MRI, or Chest Xray during the period you would be wearing the  monitor. The patch cannot be worn during these tests. You cannot remove and re-apply the  ZIO XT patch monitor.  Your ZIO patch monitor will be mailed 3 day USPS to your address on file. It may take 3-5 days  to receive your monitor after you have been enrolled.  Once you have received your monitor, please review the enclosed instructions. Your monitor  has already been registered assigning a specific monitor serial # to you.  Billing and Patient Assistance Program Information  We have supplied [employer] with any of your insurance information on file for billing purposes. [employer] offers a sliding scale Patient Assistance Program for patients that do not have  insurance, or whose insurance does not completely cover the cost of the ZIO monitor.  You must apply for the Patient Assistance Program to qualify for this discounted rate.  To apply, please call [employer] at [phone], select option 4, select option 2, ask to apply for  Patient Assistance Program. Meredeth Ide will ask your household income, and how many people  are in your household. They will quote your out-of-pocket cost based on that information.  [employer] will also be able to set up a 28-month, interest-free payment plan if needed.  Applying the monitor   Shave hair from upper left chest.  Hold abrader disc by orange tab. Rub abrader  in 40 strokes over the upper left chest as  indicated in your monitor instructions.  Clean area with 4 enclosed alcohol pads. Let dry.  Apply patch as indicated in monitor instructions. Patch will be placed under collarbone on left  side of chest with arrow pointing upward.  Rub patch adhesive wings for 2 minutes. Remove white label marked "1". Remove the white  label marked "2". Rub patch adhesive wings for 2 additional minutes.   While looking in a mirror, press and release button in center of patch. A small green light will  flash 3-4 times. This will be your only indicator that the monitor has been turned on.  Do not shower for the first 24 hours. You may shower after the first 24 hours.  Press the button if you feel a symptom. You will hear a small click. Record Date, Time and  Symptom in the Patient Logbook.  When you are ready to remove the patch, follow instructions on the last 2 pages of Patient  Logbook. Stick patch monitor onto the last page of Patient Logbook.  Place Patient Logbook in the blue and white box. Use locking tab on box and tape box closed  securely. The blue and white box has prepaid postage on it. Please place it in the mailbox as  soon as possible. Your physician should have your test results approximately 7 days after the  monitor has been mailed back to [facility].  Call [facility] Customer Care at [phone] if you have questions regarding  your ZIO XT patch monitor. Call them immediately if you see an orange light blinking on your  monitor.  If your monitor falls off in less than 4 days, contact our Monitor department at [phone].  If your monitor becomes loose or falls off after 4 days call [employer] at [phone] for  suggestions on securing your monitor

## 2022-12-31 LAB — BASIC METABOLIC PANEL
BUN/Creatinine Ratio: 17 (ref 12–28)
BUN: 15 mg/dL (ref 10–36)
CO2: 25 mmol/L (ref 20–29)
Calcium: 9.9 mg/dL (ref 8.7–10.3)
Chloride: 98 mmol/L (ref 96–106)
Creatinine, Ser: 0.87 mg/dL (ref 0.57–1.00)
Glucose: 90 mg/dL (ref 70–99)
Potassium: 4.3 mmol/L (ref 3.5–5.2)
Sodium: 139 mmol/L (ref 134–144)
eGFR: 63 mL/min/{1.73_m2} (ref >59)

## 2023-01-03 DIAGNOSIS — R000 Tachycardia, unspecified: Secondary | ICD-10-CM | POA: Diagnosis not present

## 2023-02-03 ENCOUNTER — Telehealth: Payer: Self-pay | Admitting: Internal Medicine

## 2023-02-03 NOTE — Telephone Encounter (Signed)
Spoke to patient's son Viviann Spare as outlined in [place]. Informed Viviann Spare of the following results from the provider as follows:  "Heart monitor showed NSR with rare PAC,PVC, 4 fast runs, no sustained arrhythmia or pause"  Patient's son understood with read back

## 2023-02-03 NOTE — Telephone Encounter (Signed)
Pt returning nurses phone call regarding results. Please advise.

## 2023-02-06 ENCOUNTER — Other Ambulatory Visit: Payer: Self-pay

## 2023-02-06 ENCOUNTER — Encounter (HOSPITAL_BASED_OUTPATIENT_CLINIC_OR_DEPARTMENT_OTHER): Payer: Self-pay | Admitting: *Deleted

## 2023-02-06 ENCOUNTER — Emergency Department (HOSPITAL_BASED_OUTPATIENT_CLINIC_OR_DEPARTMENT_OTHER)
Admission: EM | Admit: 2023-02-06 | Discharge: 2023-02-06 | Disposition: A | Discharge status: Home / Self Care | Payer: Medicare Other | Attending: Emergency Medicine | Admitting: Emergency Medicine

## 2023-02-06 DIAGNOSIS — R000 Tachycardia, unspecified: Secondary | ICD-10-CM | POA: Diagnosis present

## 2023-02-06 DIAGNOSIS — Z7982 Long term (current) use of aspirin: Secondary | ICD-10-CM | POA: Diagnosis not present

## 2023-02-06 DIAGNOSIS — I10 Essential (primary) hypertension: Secondary | ICD-10-CM | POA: Diagnosis not present

## 2023-02-06 DIAGNOSIS — Z79899 Other long term (current) drug therapy: Secondary | ICD-10-CM | POA: Diagnosis not present

## 2023-02-06 DIAGNOSIS — Z96651 Presence of right artificial knee joint: Secondary | ICD-10-CM | POA: Insufficient documentation

## 2023-02-06 LAB — CBC WITH DIFFERENTIAL/PLATELET
Abs Immature Granulocytes: 0.04 10*3/uL (ref 0.00–0.07)
Basophils Absolute: 0.1 10*3/uL (ref 0.0–0.1)
Basophils Relative: 1 %
Eosinophils Absolute: 0.5 10*3/uL (ref 0.0–0.5)
Eosinophils Relative: 6 %
HCT: 42.2 % (ref 36.0–46.0)
Hemoglobin: 13.6 g/dL (ref 12.0–15.0)
Immature Granulocytes: 1 %
Lymphocytes Relative: 25 %
Lymphs Abs: 2 10*3/uL (ref 0.7–4.0)
MCH: 27.4 pg (ref 26.0–34.0)
MCHC: 32.2 g/dL (ref 30.0–36.0)
MCV: 84.9 fL (ref 80.0–100.0)
Monocytes Absolute: 1 10*3/uL (ref 0.1–1.0)
Monocytes Relative: 12 %
Neutro Abs: 4.5 10*3/uL (ref 1.7–7.7)
Neutrophils Relative %: 55 %
Platelets: 388 10*3/uL (ref 150–400)
RBC: 4.97 MIL/uL (ref 3.87–5.11)
RDW: 14.8 % (ref 11.5–15.5)
WBC: 8 10*3/uL (ref 4.0–10.5)
nRBC: 0 % (ref 0.0–0.2)

## 2023-02-06 LAB — BASIC METABOLIC PANEL
Anion gap: 14 (ref 5–15)
BUN: 21 mg/dL (ref 8–23)
CO2: 24 mmol/L (ref 22–32)
Calcium: 10 mg/dL (ref 8.9–10.3)
Chloride: 99 mmol/L (ref 98–111)
Creatinine, Ser: 0.73 mg/dL (ref 0.44–1.00)
GFR, Estimated: >60 mL/min (ref >60)
Glucose, Bld: 120 mg/dL — ABNORMAL HIGH (ref 70–99)
Potassium: 3.3 mmol/L — ABNORMAL LOW (ref 3.5–5.1)
Sodium: 137 mmol/L (ref 135–145)

## 2023-02-06 LAB — TROPONIN I (HIGH SENSITIVITY): Troponin I (High Sensitivity): 12 ng/L (ref <18)

## 2023-02-06 MED ORDER — DILTIAZEM HCL-DEXTROSE 125-5 MG/125ML-% IV SOLN (PREMIX): 5.0000 mg/h | INTRAVENOUS | Status: DC

## 2023-02-06 MED ORDER — DILTIAZEM HCL-DEXTROSE 125-5 MG/125ML-% IV SOLN (PREMIX)
INTRAVENOUS | Status: AC
  Administered 2023-02-06: 5 mg/h via INTRAVENOUS
  Filled 2023-02-06: qty 125

## 2023-02-06 MED ORDER — DILTIAZEM LOAD VIA INFUSION
10.0000 mg | Freq: Once | INTRAVENOUS | Status: AC
  Administered 2023-02-06: 10 mg via INTRAVENOUS
  Filled 2023-02-06: qty 10

## 2023-02-06 NOTE — Discharge Instructions (Signed)
Continue medications as previously prescribed.  Follow-up with electrophysiology on Wednesday as scheduled, and return to the ER if symptoms significantly worsen or change.

## 2023-02-06 NOTE — ED Triage Notes (Signed)
Pt arrives POV with son.  Pt has been having tachycardia since before midnight.  Son gave her 30mg  diltiazem po around 23:50 and again at 12:20 and she receved a 25mg  metoprolol around 23:50.  Initial HR 169.  Pt helped out of car and brought back to room

## 2023-02-06 NOTE — ED Provider Notes (Addendum)
[place] [facility] Provider Note   CSN: [id number] Arrival date & time: 02/06/23  0046     History  Chief Complaint  Patient presents with   Tachycardia    Rebecca Stanley is a 89 y.o. female.  Patient is a 89 year old female with a past medical history of hyperlipidemia, hypertension, right total knee replacement, and recent visit to the ER for palpitations/possible atrial flutter.  She has an appointment upcoming with electrophysiology this Wednesday.  Patient presenting this evening stating that her heart began racing this evening.  She denies any chest pain, but does feel somewhat short of breath.  No increased leg swelling.  Family member at bedside states that she took 2 doses of her Cardizem prior to coming here, but this did not seem to help.  The history is provided by the patient.       Home Medications Prior to Admission medications   Medication Sig Start Date End Date Taking? Authorizing Provider  Acetaminophen Extra Strength 500 MG TABS Take 1,000 mg by mouth in the morning and at bedtime. 08/09/22   [provider]  albuterol (VENTOLIN HFA) 108 (90 Base) MCG/ACT inhaler Inhale 2 puffs into the lungs every 6 (six) hours as needed for wheezing or shortness of breath. 09/23/22   Regalado, Belkys A, MD  amitriptyline (ELAVIL) 25 MG tablet Take 25 mg by mouth at bedtime.    [provider]  aspirin 81 MG chewable tablet Chew 81 mg by mouth 2 (two) times daily. 08/09/22   [provider]  Cholecalciferol (VITAMIN D PO) Take 1 tablet by mouth daily.    [provider]  clonazePAM (KLONOPIN) 0.5 MG tablet Take 0.25-0.5 mg by mouth at bedtime.    [provider]  cyanocobalamin 1000 MCG tablet Take 1 tablet (1,000 mcg total) by mouth daily. 09/24/22   Regalado, Belkys A, MD  diltiazem (CARDIZEM) 30 MG tablet Take 1 tablet (30 mg total) by mouth 4 (four) times daily as needed (for tachycardia and  palpitations that do not improve with regular meds, fluids, and rest). 12/16/22   Tegeler, Canary Brim, MD  DULoxetine (CYMBALTA) 20 MG capsule Take 20 mg by mouth 2 (two) times daily. 05/13/20   [provider]  furosemide (LASIX) 20 MG tablet TAKE 1 TABLET(20 MG) BY MOUTH EVERY OTHER DAY 10/25/22   End, Cristal Deer, MD  GEMTESA 75 MG TABS Take 1 tablet by mouth at bedtime.    [provider]  ipratropium-albuterol (DUONEB) 0.5-2.5 (3) MG/3ML SOLN Take 3 mLs by nebulization 3 (three) times daily. 09/23/22   Regalado, Belkys A, MD  levothyroxine (SYNTHROID, LEVOTHROID) 25 MCG tablet Take 62.5 mcg by mouth daily.    [provider]  metoprolol succinate (TOPROL-XL) 25 MG 24 hr tablet Take 1 tablet (25 mg total) by mouth at bedtime. 10/12/22   Furth, Cadence H, PA-C  nitroGLYCERIN (NITROSTAT) 0.4 MG SL tablet Place 1 tablet (0.4 mg total) under the tongue every 5 (five) minutes as needed for chest pain. Maximum of 3 doses. 08/06/20   End, Cristal Deer, MD  polyethylene glycol (MIRALAX / GLYCOLAX) 17 g packet Take 1 packet by mouth daily. 08/09/22   [provider]  potassium chloride SA (KLOR-CON M) 10 MEQ tablet Take 1 tablet (10 mEq total) by mouth daily as needed. When you take lasix 07/07/22 10/12/22  Furth, Cadence H, PA-C  pregabalin (LYRICA) 100 MG capsule Take 100 mg by mouth daily. At 4 pm  [provider]      Allergies    Losartan, Amlodipine, Cholestyramine, Cortisone, Prednisone, Tramadol, and Lisinopril    Review of Systems   Review of Systems  All other systems reviewed and are negative.   Physical Exam Updated Vital Signs BP (!) 150/84   Pulse (!) 144   Temp 98.1 F (36.7 C) (Oral)   Resp (!) 23   Wt 70.3 kg   SpO2 93%   BMI 29.29 kg/m  Physical Exam Vitals and nursing note reviewed.  Constitutional:      General: She is not in acute distress.    Appearance: She is well-developed. She is not diaphoretic.  HENT:     Head:  Normocephalic and atraumatic.  Cardiovascular:     Rate and Rhythm: Regular rhythm. Tachycardia present.     Heart sounds: No murmur heard.    No friction rub. No gallop.  Pulmonary:     Effort: Pulmonary effort is normal. No respiratory distress.     Breath sounds: Normal breath sounds. No wheezing.  Abdominal:     General: Bowel sounds are normal. There is no distension.     Palpations: Abdomen is soft.     Tenderness: There is no abdominal tenderness.  Musculoskeletal:        General: Normal range of motion.     Cervical back: Normal range of motion and neck supple.  Skin:    General: Skin is warm and dry.  Neurological:     General: No focal deficit present.     Mental Status: She is alert and oriented to person, place, and time.     ED Results / Procedures / Treatments   Labs (all labs ordered are listed, but only abnormal results are displayed) Labs Reviewed - No data to display  EKG None  Radiology No results found.  Procedures Procedures    Medications Ordered in ED Medications  diltiazem (CARDIZEM) 1 mg/mL load via infusion 10 mg (has no administration in time range)    And  diltiazem (CARDIZEM) 125 mg in dextrose 5% 125 mL (1 mg/mL) infusion (has no administration in time range)    ED Course/ Medical Decision Making/ A&P  Patient is a 89 year old female presenting with palpitations as described in the HPI.  Patient arrives here with a heart rate of 150 and an EKG showing a wide-complex tachycardia that is regular and most consistent with atrial flutter.  Her vital signs are otherwise stable and physical examination otherwise unremarkable.  Laboratory studies obtained including CBC and metabolic panel.  Her potassium is 3.3, but other laboratory studies are unremarkable.  Shortly after arrival, patient was placed on a Cardizem drip and within a short period of time converted to a sinus rhythm.  The Cardizem drip was then stopped and patient observed for  several hours.  She has had no further ectopy and is feeling much improved.  She has an upcoming appointment in 2 days with electrophysiology and I believe patient can be discharged with follow-up as scheduled.  CRITICAL CARE Performed by: Geoffery Lyons Total critical care time: 35 minutes Critical care time was exclusive of separately billable procedures and treating other patients. Critical care was necessary to treat or prevent imminent or life-threatening deterioration. Critical care was time spent personally by me on the following activities: development of treatment plan with patient and/or surrogate as well as nursing, discussions with consultants, evaluation of patient's response to treatment, examination of patient, obtaining history from patient or surrogate, ordering  and performing treatments and interventions, ordering and review of laboratory studies, ordering and review of radiographic studies, pulse oximetry and re-evaluation of patient's condition.   Final Clinical Impression(s) / ED Diagnoses Final diagnoses:  None    Rx / DC Orders ED Discharge Orders     None         Geoffery Lyons, MD 02/06/23 1610    Geoffery Lyons, MD 02/06/23 9604

## 2023-02-08 ENCOUNTER — Encounter: Payer: Self-pay | Admitting: Cardiology

## 2023-02-08 ENCOUNTER — Ambulatory Visit: Payer: Medicare Other | Attending: Cardiology | Admitting: Cardiology

## 2023-02-08 VITALS — BP 144/70 | HR 64 | Ht 61.0 in | Wt 155.0 lb

## 2023-02-08 DIAGNOSIS — R000 Tachycardia, unspecified: Secondary | ICD-10-CM

## 2023-02-08 DIAGNOSIS — I471 Supraventricular tachycardia, unspecified: Secondary | ICD-10-CM

## 2023-02-08 DIAGNOSIS — I5032 Chronic diastolic (congestive) heart failure: Secondary | ICD-10-CM | POA: Diagnosis present

## 2023-02-08 MED ORDER — DILTIAZEM HCL ER COATED BEADS 120 MG PO CP24: 120.0000 mg | ORAL_CAPSULE | Freq: Every day | ORAL | 3 refills | Status: DC

## 2023-02-08 NOTE — Progress Notes (Signed)
Electrophysiology Office Note:    Date:  02/08/2023   ID:  Rebecca Stanley, DOB 03-26-1930, MRN [id number]  [facility] Cardiologist:  Yvonne Kendall, MD  [facility] Electrophysiologist:  Lanier Prude, MD   Referring MD: Marianne Sofia, PA-C   Chief Complaint: SVT  History of Present Illness:    Ms. Rebecca Stanley is a 89 year old woman who I am seeing today for an evaluation of SVT at the request of Dr. Okey Dupre.  The patient has a history of hyperlipidemia, hypertension, SVT.  The patient saw Cadence in clinic December 30, 2022.  That appointment came after an ER visit on October 4 for tachycardia.  She was treated with IV fluids and IV diltiazem and converted back to sinus rhythm.  She was ultimately discharged home from the ER. She is currently taking as needed diltiazem and daily metoprolol succinate.   Discussed the use of AI scribe software for clinical note transcription with the patient, who gave verbal consent to proceed.  History of Present Illness   The patient, with a history of arthritis and episodes of rapid heart rate, presents with her son and caregiver. The patient's son reports that the patient has had several episodes of rapid heart rate, described as the heart "flying." These episodes are associated with elevated blood pressure. The most recent episode occurred on a Sunday night, during which the patient's blood pressure was 169/109 and her heart rate was about 164. The patient was taken to the ER, where she was given metoprolol and diltiazem, which helped to control her heart rate. The patient also has a history of arthritis and is on a variety of medications, including amitriptyline. The patient's son reports that the patient has been trying to wean off amitriptyline for about six years. The patient also needs a shoulder replacement due to her arthritis, but this has been postponed due to her heart condition.               Their past medical, social and family  history was reveiwed.   ROS:   Please see the history of present illness.    All other systems reviewed and are negative.  EKGs/Labs/Other Studies Reviewed:    The following studies were reviewed today:  December 16, 2022 EKG shows tachycardia with right bundle branch block and left posterior fascicular block.  February 06, 2023 shows sinus rhythm, right bundle-branch block, left posterior fascicular block  September 21, 2022 echo shows EF 65-70, normal RV function, trivial MR  January 31, 2023 ZIO monitor shows no atrial fibrillation or flutter.  Rare supraventricular and ventricular ectopy.  No sustained arrhythmias.        Physical Exam:    VS:  BP (!) 144/70 (BP Location: Left Arm, Patient Position: Sitting, Cuff Size: Normal)   Pulse 64   Ht 5\' 1"  (1.549 m)   Wt 155 lb (70.3 kg)   SpO2 97%   BMI 29.29 kg/m     Wt Readings from Last 3 Encounters:  02/08/23 155 lb (70.3 kg)  02/06/23 155 lb (70.3 kg)  12/30/22 155 lb 6.4 oz (70.5 kg)     Physical Exam   GEN: Elderly, no distress in wheelchair CHEST: Normal breath sounds on auscultation. RRR. No murmur           ASSESSMENT AND PLAN:    1. Tachycardia   2. Paroxysmal SVT (supraventricular tachycardia) (HCC)   3. Chronic heart failure with preserved ejection fraction (HFpEF) (HCC)  Assessment and Plan    Tachycardia Recurrent episodes with rapid heart rate around 150-160 bpm. Responds well to IV diltiazem in the ER. Currently on metoprolol and diltiazem as needed. -Start long-acting diltiazem daily for rhythm control. -Continue metoprolol for one week, then discontinue if heart rhythm is stable. -If breakthrough episode occurs, take short-acting diltiazem and wait 30-45 minutes before going to the hospital. -If recurrent episodes persist despite treatment with CCB, can uptitrate the CCB or consider amiodarone.  Medication Management Currently on amitriptyline, which can potentially contribute to  arrhythmias. Plan to discontinue this medication. -Support discontinuation of amitriptyline as planned by geriatric psychiatrist.  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 Steroid injection planned but not recommended due to potential exacerbation of arrhythmias. -Do not proceed with steroid injection at this time.  Hypertension Blood pressure elevated during episodes of atrial tachycardia. -Discontinue amlodipine when starting daily diltiazem due to potential synergistic blood pressure effect.               Signed, Rossie Muskrat. Lalla Brothers, MD, [facility], [facility] 02/08/2023 4:06 PM    Electrophysiology [facility]

## 2023-02-08 NOTE — Patient Instructions (Signed)
Medication Instructions:  Your physician has recommended you make the following change in your medication:  1) STOP taking amlodipine 2) START taking Cardizem (diltiazem) 120 mg daily   If you do not have any more episodes of tachycardia after one week - STOP taking metoprolol   *If you need a refill on your cardiac medications before your next appointment, please call your pharmacy*  Follow-Up: At [facility], you and your health needs are our priority.  As part of our continuing mission to provide you with exceptional heart care, we have created designated Provider Care Teams.  These Care Teams include your primary Cardiologist (physician) and Advanced Practice Providers (APPs -  Physician Assistants and Nurse Practitioners) who all work together to provide you with the care you need, when you need it.  Your next appointment:   6 weeks  Provider:   Sherie Don, NP

## 2023-02-16 ENCOUNTER — Telehealth: Payer: Self-pay | Admitting: Cardiology

## 2023-02-16 NOTE — Telephone Encounter (Signed)
Pt c/o BP issue: STAT if pt c/o blurred vision, one-sided weakness or slurred speech  1. What are your last 5 BP readings?  181/77  157/85   2. Are you having any other symptoms (ex. Dizziness, headache, blurred vision, passed out)? Patient's son states that patient just doesn't feel right   3. What is your BP issue? Patient was taken off her BP medications and put on   dilTIAZem HCl Coated Beads  --- Requesting call back due to higher readings.

## 2023-02-16 NOTE — Telephone Encounter (Signed)
Returned the call to the patient's son, per the dpr. He stated that last night the patient's blood pressure was 181/77  and heart rate was 55. This morning it was 157/85 and heart rate was 75. This reading was prior to taking the Diltiazem 120 mg.   The son gave the patient 25 mg of Hydralazine last night but did not recheck the blood pressure.   The patient's son has discontinued the metoprolol and amitriptyline. The medication list has been updated. The son would like a PRN medication for the patient's blood pressure when it gets elevated.   The son has been advised to keep a blood pressure log 1-2 hours after she has taken her medication.

## 2023-02-21 NOTE — Telephone Encounter (Signed)
Call placed back to the son. He stated that he has been giving the patient 12.5 mg of Hydralazine nightly which has helped lower the blood pressure. Hydralazine had been previously discontinued but he had some left over. He stated that the blood pressure mainly stays elevated in the evening.   She was previously on Amlodipine 2.5 mg.

## 2023-02-22 MED ORDER — AMLODIPINE BESYLATE 2.5 MG PO TABS: 2.5000 mg | ORAL_TABLET | Freq: Every evening | ORAL | Status: DC

## 2023-02-22 NOTE — Telephone Encounter (Signed)
Left a message for the son to call back

## 2023-02-22 NOTE — Telephone Encounter (Signed)
The patient's son has been made aware and will monitor her blood pressure  Furth, Cadence H, PA-C  You2 hours ago (1:58 PM)    We can do amlodipine 2.5mg  at night.

## 2023-03-02 ENCOUNTER — Emergency Department (HOSPITAL_BASED_OUTPATIENT_CLINIC_OR_DEPARTMENT_OTHER)
Admission: EM | Admit: 2023-03-02 | Discharge: 2023-03-02 | Disposition: A | Discharge status: Home / Self Care | Payer: Medicare Other | Attending: Emergency Medicine | Admitting: Emergency Medicine

## 2023-03-02 ENCOUNTER — Encounter (HOSPITAL_BASED_OUTPATIENT_CLINIC_OR_DEPARTMENT_OTHER): Payer: Self-pay

## 2023-03-02 ENCOUNTER — Other Ambulatory Visit: Payer: Self-pay

## 2023-03-02 DIAGNOSIS — Z7982 Long term (current) use of aspirin: Secondary | ICD-10-CM | POA: Diagnosis not present

## 2023-03-02 DIAGNOSIS — R002 Palpitations: Secondary | ICD-10-CM | POA: Insufficient documentation

## 2023-03-02 DIAGNOSIS — R600 Localized edema: Secondary | ICD-10-CM | POA: Insufficient documentation

## 2023-03-02 DIAGNOSIS — R42 Dizziness and giddiness: Secondary | ICD-10-CM | POA: Insufficient documentation

## 2023-03-02 LAB — CBC WITH DIFFERENTIAL/PLATELET
Abs Immature Granulocytes: 0.03 10*3/uL (ref 0.00–0.07)
Basophils Absolute: 0.1 10*3/uL (ref 0.0–0.1)
Basophils Relative: 1 %
Eosinophils Absolute: 0.4 10*3/uL (ref 0.0–0.5)
Eosinophils Relative: 4 %
HCT: 40.5 % (ref 36.0–46.0)
Hemoglobin: 13.1 g/dL (ref 12.0–15.0)
Immature Granulocytes: 0 %
Lymphocytes Relative: 21 %
Lymphs Abs: 1.7 10*3/uL (ref 0.7–4.0)
MCH: 27.3 pg (ref 26.0–34.0)
MCHC: 32.3 g/dL (ref 30.0–36.0)
MCV: 84.4 fL (ref 80.0–100.0)
Monocytes Absolute: 1 10*3/uL (ref 0.1–1.0)
Monocytes Relative: 12 %
Neutro Abs: 5 10*3/uL (ref 1.7–7.7)
Neutrophils Relative %: 62 %
Platelets: 367 10*3/uL (ref 150–400)
RBC: 4.8 MIL/uL (ref 3.87–5.11)
RDW: 14.6 % (ref 11.5–15.5)
WBC: 8.2 10*3/uL (ref 4.0–10.5)
nRBC: 0 % (ref 0.0–0.2)

## 2023-03-02 LAB — BASIC METABOLIC PANEL
Anion gap: 10 (ref 5–15)
BUN: 23 mg/dL (ref 8–23)
CO2: 26 mmol/L (ref 22–32)
Calcium: 9.7 mg/dL (ref 8.9–10.3)
Chloride: 102 mmol/L (ref 98–111)
Creatinine, Ser: 0.75 mg/dL (ref 0.44–1.00)
GFR, Estimated: >60 mL/min (ref >60)
Glucose, Bld: 103 mg/dL — ABNORMAL HIGH (ref 70–99)
Potassium: 3.6 mmol/L (ref 3.5–5.1)
Sodium: 138 mmol/L (ref 135–145)

## 2023-03-02 LAB — MAGNESIUM: Magnesium: 1.7 mg/dL (ref 1.7–2.4)

## 2023-03-02 NOTE — ED Notes (Signed)

## 2023-03-02 NOTE — ED Triage Notes (Signed)
Pt arrives with c/o palpitations that started around 7pm this evening. Pt has hx of tachycardia. Pt received PRN cardizem 30mg  x2 about an ago hour for tachycardia from family. Pt denies CP or SOB.

## 2023-03-02 NOTE — Discharge Instructions (Signed)
You were seen in the emergency department for an episode of rapid heart rate.  Your heart rate was normal here and your electrolytes were unremarkable.  Please continue your current medications and contact your cardiologist tomorrow for further recommendations.  Return if any worsening or concerning symptoms

## 2023-03-02 NOTE — ED Provider Notes (Signed)
[place] [facility] Provider Note   CSN: [id number] Arrival date & time: 03/02/23  2023     History  Chief Complaint  Patient presents with   Palpitations    Rebecca Stanley is a 89 y.o. female.  She is presenting with a family member complaining of acute onset of rapid heart rate starting around 7 PM tonight.  She took 2 extra doses of an as needed Cardizem.  No chest pain or shortness of breath. Blood pressure is very high in the 190s.  Everything seems to be improving on arrival here. The history is provided by the patient and a relative.  Palpitations Palpitations quality:  Fast Onset quality:  Sudden Duration:  2 hours Timing:  Constant Progression:  Resolved Chronicity:  Recurrent Relieved by: diltiazem. Associated symptoms: dizziness, lower extremity edema and malaise/fatigue   Associated symptoms: no chest pain and no cough        Home Medications Prior to Admission medications   Medication Sig Start Date End Date Taking? Authorizing Provider  Acetaminophen Extra Strength 500 MG TABS Take 1,000 mg by mouth in the morning and at bedtime. 08/09/22   [provider]  albuterol (VENTOLIN HFA) 108 (90 Base) MCG/ACT inhaler Inhale 2 puffs into the lungs every 6 (six) hours as needed for wheezing or shortness of breath. 09/23/22   Regalado, Belkys A, MD  amLODipine (NORVASC) 2.5 MG tablet Take 1 tablet (2.5 mg total) by mouth at bedtime. 02/22/23 05/23/23  Furth, Cadence H, PA-C  aspirin 81 MG chewable tablet Chew 81 mg by mouth 2 (two) times daily. 08/09/22   [provider]  Cholecalciferol (VITAMIN D PO) Take 1 tablet by mouth daily.    [provider]  clonazePAM (KLONOPIN) 0.5 MG tablet Take 0.25-0.5 mg by mouth at bedtime.    [provider]  cyanocobalamin 1000 MCG tablet Take 1 tablet (1,000 mcg total) by mouth daily. 09/24/22   Regalado, Belkys A, MD  diltiazem (CARDIZEM CD) 120 MG 24 hr capsule  Take 1 capsule (120 mg total) by mouth daily. 02/08/23   Lanier Prude, MD  diltiazem (CARDIZEM) 30 MG tablet Take 1 tablet (30 mg total) by mouth 4 (four) times daily as needed (for tachycardia and palpitations that do not improve with regular meds, fluids, and rest). 12/16/22   Tegeler, Canary Brim, MD  DULoxetine (CYMBALTA) 20 MG capsule Take 20 mg by mouth 2 (two) times daily. 05/13/20   [provider]  furosemide (LASIX) 20 MG tablet TAKE 1 TABLET(20 MG) BY MOUTH EVERY OTHER DAY 10/25/22   End, Cristal Deer, MD  GEMTESA 75 MG TABS Take 1 tablet by mouth at bedtime. Patient not taking: Reported on 02/08/2023    [provider]  ipratropium-albuterol (DUONEB) 0.5-2.5 (3) MG/3ML SOLN Take 3 mLs by nebulization 3 (three) times daily. 09/23/22   Regalado, Belkys A, MD  levothyroxine (SYNTHROID, LEVOTHROID) 25 MCG tablet Take 62.5 mcg by mouth daily.    [provider]  nitroGLYCERIN (NITROSTAT) 0.4 MG SL tablet Place 1 tablet (0.4 mg total) under the tongue every 5 (five) minutes as needed for chest pain. Maximum of 3 doses. 08/06/20   End, Cristal Deer, MD  polyethylene glycol (MIRALAX / GLYCOLAX) 17 g packet Take 1 packet by mouth daily. 08/09/22   [provider]  potassium chloride SA (KLOR-CON M) 10 MEQ tablet Take 1 tablet (10 mEq total) by mouth daily as needed. When you take lasix 07/07/22 10/12/22  Fransico Averill Winters,  Cadence H, PA-C  pregabalin (LYRICA) 100 MG capsule Take 100 mg by mouth daily. At 4 pm    [provider]      Allergies    Losartan, Amlodipine, Cholestyramine, Cortisone, Prednisone, Tramadol, and Lisinopril    Review of Systems   Review of Systems  Constitutional:  Positive for malaise/fatigue. Negative for fever.  Respiratory:  Negative for cough.   Cardiovascular:  Positive for palpitations and leg swelling. Negative for chest pain.  Neurological:  Positive for dizziness.    Physical Exam Updated Vital Signs BP (!) 167/79   Pulse  78   Temp 98.8 F (37.1 C) (Oral)   Resp (!) 23   Wt 70.3 kg   SpO2 93%   BMI 29.29 kg/m  Physical Exam Vitals and nursing note reviewed.  Constitutional:      General: She is not in acute distress.    Appearance: Normal appearance. She is well-developed.  HENT:     Head: Normocephalic and atraumatic.  Eyes:     Conjunctiva/sclera: Conjunctivae normal.  Cardiovascular:     Rate and Rhythm: Normal rate and regular rhythm.     Heart sounds: No murmur heard. Pulmonary:     Effort: Pulmonary effort is normal. No respiratory distress.     Breath sounds: Normal breath sounds.  Abdominal:     Palpations: Abdomen is soft.     Tenderness: There is no abdominal tenderness. There is no guarding or rebound.  Musculoskeletal:        General: No swelling.     Cervical back: Neck supple.     Right lower leg: Edema present.     Left lower leg: Edema present.  Skin:    General: Skin is warm and dry.     Capillary Refill: Capillary refill takes less than 2 seconds.  Neurological:     General: No focal deficit present.     Mental Status: She is alert.     ED Results / Procedures / Treatments   Labs (all labs ordered are listed, but only abnormal results are displayed) Labs Reviewed  BASIC METABOLIC PANEL - Abnormal; Notable for the following components:      Result Value   Glucose, Bld 103 (*)    All other components within normal limits  CBC WITH DIFFERENTIAL/PLATELET  MAGNESIUM    EKG EKG Interpretation Date/Time:  Thursday March 02 2023 20:34:25 EST Ventricular Rate:  74 PR Interval:  162 QRS Duration:  123 QT Interval:  437 QTC Calculation: 485 R Axis:   78  Text Interpretation: Sinus rhythm Probable left atrial enlargement Right bundle branch block No significant change since prior 11/24 Confirmed by Meridee Score [phone]) on 03/02/2023 8:38:29 PM  Radiology No results found.  Procedures Procedures    Medications Ordered in ED Medications - No data to  display  ED Course/ Medical Decision Making/ A&P                                 Medical Decision Making Amount and/or Complexity of Data Reviewed Labs: ordered.   This patient complains of rapid heartbeat; this involves an extensive number of treatment Options and is a complaint that carries with it a high risk of complications and morbidity. The differential includes arrhythmia, A-fib, SVT, dehydration, metabolic derangement  I ordered, reviewed and interpreted labs, which included CBC normal chemistries normal magnesium normal Additional history obtained from patient's son Previous records obtained  and reviewed in epic including recent cardiology notes Cardiac monitoring reviewed, sinus rhythm Social determinants considered, no significant barriers Critical Interventions: None  After the interventions stated above, I reevaluated the patient and found patient to be back to baseline and stable in sinus rhythm Admission and further testing considered, no indications for admission at this time.  Recommended reaching out to their cardiology team for further recommendations on medication adjustments.  Return instructions discussed         Final Clinical Impression(s) / ED Diagnoses Final diagnoses:  Heart palpitations    Rx / DC Orders ED Discharge Orders     None         Terrilee Files, MD 03/03/23 [phone]

## 2023-03-23 ENCOUNTER — Encounter: Payer: Self-pay | Admitting: Cardiology

## 2023-03-23 ENCOUNTER — Ambulatory Visit: Payer: Medicare Other | Attending: Cardiology | Admitting: Cardiology

## 2023-03-23 VITALS — BP 150/64 | HR 59 | Ht 61.0 in | Wt 157.2 lb

## 2023-03-23 DIAGNOSIS — I471 Supraventricular tachycardia, unspecified: Secondary | ICD-10-CM | POA: Insufficient documentation

## 2023-03-23 NOTE — Progress Notes (Addendum)
 Electrophysiology Clinic Note    Date:  03/23/2023  Patient ID:  Amandalee, Lacap 02-26-1931, MRN [id number] PCP:  Thurmond Cathlyn LABOR., MD  Cardiologist:  Lonni Hanson, MD Electrophysiologist: OLE ONEIDA HOLTS, MD   Discussed the use of AI scribe software for clinical note transcription with the patient, who gave verbal consent to proceed.   Patient Profile    Chief Complaint: palpitations, HTN  History of Present Illness: YELENA METZER is a 89 y.o. female with PMH notable for SVT, HTN, HLD, major cognitive disorder, ; seen today for OLE ONEIDA HOLTS, MD for routine electrophysiology followup.  She last saw Dr. Holts 01/2023 for consultation of SVT. She was having recurrent , symptomatic episodes with HR up to 150-160s. He added daily diltiazem  to her daily metop, and PRN dilt. Recommended to stop daily BB in 1 week if rhythm is stable He also recommended stopping amitriptyline, mgmt by psych.   She presented to the ER 12/19 with palpitations and HTN up to 190s. Had already taken PRN dilt x 2. Pulse on arrival was nl, BP was lower (167/79 was documented).  On follow-up today, she has had no further episodes since 12/19. Has not needed PRN dilt at all since then. Her son joins her for clinic visit, and provides most of the history d/t patient's dementia.   The patient's blood pressure has been running high, with readings often in the 160s. The patient checks her blood pressure a couple of times a week at home. The patient also reports swelling in both legs, which has been ongoing for some time. The patient's family member reports that the patient fell recently, but it is unclear what caused the fall. The patient also reports shoulder pain due to bone-on-bone contact. The patient is considering getting a steroid injection for the pain, but is aware that it may cause fast heart rates and higher blood pressures. The patient had a knee replacement two months before turning 89, which has  been successful in relieving her knee pain.   AAD History: None     ROS:  Please see the history of present illness. All other systems are reviewed and otherwise negative.    Physical Exam    VS:  BP (!) 150/64 (BP Location: Left Arm, Patient Position: Sitting, Cuff Size: Normal)   Pulse (!) 59   Ht 5' 1 (1.549 m)   Wt 157 lb 3.2 oz (71.3 kg)   SpO2 93%   BMI 29.70 kg/m  BMI: Body mass index is 29.7 kg/m.  Wt Readings from Last 3 Encounters:  03/23/23 157 lb 3.2 oz (71.3 kg)  03/02/23 155 lb (70.3 kg)  02/08/23 155 lb (70.3 kg)     GEN- The patient is well appearing, alert and oriented x 3 today.   Lungs- Clear to ausculation bilaterally, normal work of breathing.  Heart- Regular rate and rhythm, no murmurs, rubs or gallops Extremities- 1+ peripheral edema, warm, dry    Studies Reviewed   Previous EP, cardiology notes.    EKG is ordered. Personal review of EKG from today shows:    EKG Interpretation Date/Time:  Thursday March 23 2023 14:51:23 EST Ventricular Rate:  59 PR Interval:  170 QRS Duration:  94 QT Interval:  460 QTC Calculation: 455 R Axis:   5  Text Interpretation: Sinus bradycardia Incomplete right bundle branch block Confirmed by Reta Norgren [phone]) on 03/23/2023 2:55:45 PM    Long term monitor, 12/30/2022   The patient  was monitored for 14 days.   The predominant rhythm was sinus with an average rate of 71 bpm (range 48-121 bpm in sinus).   There were rare PAC's and PVC's.   4 supraventricular runs were observed, lasting up to 9 beats with a maximum rate of 179 bpm.   No sustained arrhythmia or prolonged pause occurred.   Patient triggered events correspond to sinus rhythm, PAC's, and artifact.   Predominantly sinus rhythm with rare PAC's and PVC's as well as a few brief runs of PSVT.  No sustained arrhythmia was observed.  TTE, 09/21/2022  1. Left ventricular ejection fraction, by estimation, is 65 to 70%. The left ventricle has normal  function. The left ventricle has no regional wall motion abnormalities. There is moderate concentric left ventricular hypertrophy. Left ventricular diastolic parameters are consistent with Grade I diastolic dysfunction (impaired relaxation).  2. Right ventricular systolic function is normal. The right ventricular size is normal. There is normal pulmonary artery systolic pressure.  3. Left atrial size was mildly dilated.  4. The mitral valve is normal in structure. Trivial mitral valve regurgitation. No evidence of mitral stenosis.  5. The aortic valve is normal in structure. Aortic valve regurgitation is trivial. Aortic valve sclerosis/calcification is present, without any evidence of aortic stenosis. Aortic valve area, by VTI measures 2.51 cm. Aortic valve mean gradient measures 7.0 mmHg. Aortic valve Vmax measures 1.76 m/s.  6. The inferior vena cava is normal in size with greater than 50% respiratory variability, suggesting right atrial pressure of 3 mmHg.     Assessment and Plan     #) SVT Episode on 04/02/2023 managed with PRN diltiazem  30mg  x 2, but required ER visit. Patient's pulse at ER was normal.  -Continue current regimen of diltiazem  120mg  daily + 30mg  PRN. Not able to increase daily dilt dose d/t bradycardia and recent falls Encouraged to wait several hours after the beginning of an episode prior to going to ER. -Consider vagal maneuvers during episodes of tachycardia.   #) HTN #) falls Blood pressure readings in the 160s-180s at home, remains elevated in office -Continue amlodipine  2.5mg , diltiazem  as above -Check blood pressure regularly and record readings for review at follow-up appointments. -Encourage low sodium diet and increased hydration.  Bilateral lower extremity edema New onset, possibly related to recent knee replacement. -Increase physical activity, particularly walking, to promote venous return. -Continue Lasix  as currently prescribed. -Monitor for  worsening edema and report any changes.  #) OA, Shoulder pain Considering steroid injection, but aware of potential for increased heart rate and blood pressure. -If pain becomes unmanageable, may consider steroid injection with understanding of potential side effects. -Continue current pain management strategies as per ortho       Current medicines are reviewed at length with the patient today.   The patient has concerns regarding her medicines.  The following changes were made today:  none  Labs/ tests ordered today include:  Orders Placed This Encounter  Procedures   EKG 12-Lead     Disposition: Follow up with Dr. Cindie or EP APP in 3 months   Signed, Mckenzey Parcell, NP  03/23/23  3:51 PM  Electrophysiology [facility]

## 2023-03-23 NOTE — Patient Instructions (Signed)
Medication Instructions:  No changes *If you need a refill on your cardiac medications before your next appointment, please call your pharmacy*   Lab Work: None ordered If you have labs (blood work) drawn today and your tests are completely normal, you will receive your results only by: MyChart Message (if you have MyChart) OR A paper copy in the mail If you have any lab test that is abnormal or we need to change your treatment, we will call you to review the results.   Testing/Procedures: None ordered   Follow-Up: At [facility], you and your health needs are our priority.  As part of our continuing mission to provide you with exceptional heart care, we have created designated Provider Care Teams.  These Care Teams include your primary Cardiologist (physician) and Advanced Practice Providers (APPs -  Physician Assistants and Nurse Practitioners) who all work together to provide you with the care you need, when you need it.  We recommend signing up for the patient portal called "MyChart".  Sign up information is provided on this After Visit Summary.  MyChart is used to connect with patients for Virtual Visits (Telemedicine).  Patients are able to view lab/test results, encounter notes, upcoming appointments, etc.  Non-urgent messages can be sent to your provider as well.   To learn more about what you can do with MyChart, go to [email or web address].    Your next appointment:   3 month(s)  Provider:   Steffanie Dunn, MD

## 2023-04-03 ENCOUNTER — Telehealth: Payer: Self-pay | Admitting: Internal Medicine

## 2023-04-03 NOTE — Telephone Encounter (Signed)
Called and spoke with son per DPR. Son reports that patient is currently taking Diltiazem 120 MG daily. Son reports that on 03/31/23 her heart rate increased to 162. They gave her 2 doses of her PRN Diltiazem 30 MG and it improved. Patient states that today her heart rate has increased to 95. Son reports that the patient's heart rate normally runs 65 -75. Son states that the patient's blood pressure has been elevated as well. Per son the patient's blood pressure has been running 150's-160's/ 70's. Son reports that on 04/01/23 the patient's blood pressure increased to 194/77. Son gave the patient 12.5 MG of hydralazine that had been prescribed to the patient in the past. Patient is scheduled to see Dr. Okey Dupre on 04/06/23. Son was encouraged to keep a log of blood pressures and heart rates for upcoming appointment. Will forward to MD for any further recommendations.

## 2023-04-03 NOTE — Telephone Encounter (Signed)
Pt c/o medication issue:  1. Name of Medication:   diltiazem (CARDIZEM CD) 120 MG 24 hr capsule    2. How are you currently taking this medication (dosage and times per day)?  Take 1 capsule (120 mg total) by mouth daily.       3. Are you having a reaction (difficulty breathing--STAT)? No  4. What is your medication issue? Pt's son is requesting a callback regarding this medication maybe needing some adjustments made to it because of pt having recent episode. Please advise

## 2023-04-04 NOTE — Telephone Encounter (Signed)
Thank you for the update.  I recommend that Ms. Krummel continue her current medications and that she and her son keep a log of her HR's/BP's.  We will follow-up as planned later this week.  Yvonne Kendall, MD [facility]

## 2023-04-05 NOTE — Telephone Encounter (Signed)
Pt's son made aware of recommendations and verbalized understanding.

## 2023-04-06 ENCOUNTER — Ambulatory Visit: Payer: Medicare Other | Attending: Internal Medicine | Admitting: Internal Medicine

## 2023-04-06 ENCOUNTER — Encounter: Payer: Self-pay | Admitting: Internal Medicine

## 2023-04-06 VITALS — BP 141/72 | HR 60 | Ht 61.0 in | Wt 153.8 lb

## 2023-04-06 DIAGNOSIS — R0989 Other specified symptoms and signs involving the circulatory and respiratory systems: Secondary | ICD-10-CM | POA: Insufficient documentation

## 2023-04-06 DIAGNOSIS — I471 Supraventricular tachycardia, unspecified: Secondary | ICD-10-CM | POA: Insufficient documentation

## 2023-04-06 DIAGNOSIS — I5032 Chronic diastolic (congestive) heart failure: Secondary | ICD-10-CM

## 2023-04-06 DIAGNOSIS — R000 Tachycardia, unspecified: Secondary | ICD-10-CM

## 2023-04-06 DIAGNOSIS — R002 Palpitations: Secondary | ICD-10-CM

## 2023-04-06 MED ORDER — AMIODARONE HCL 200 MG PO TABS: 200.0000 mg | ORAL_TABLET | Freq: Every day | ORAL | 3 refills | Status: DC

## 2023-04-06 MED ORDER — HYDRALAZINE HCL 25 MG PO TABS: 25.0000 mg | ORAL_TABLET | Freq: Three times a day (TID) | ORAL | 3 refills | Status: DC | PRN

## 2023-04-06 MED ORDER — AMIODARONE HCL 200 MG PO TABS: 200.0000 mg | ORAL_TABLET | Freq: Two times a day (BID) | ORAL | 0 refills | Status: DC

## 2023-04-06 NOTE — Patient Instructions (Signed)
Medication Instructions:  Your physician recommends the following medication changes.  START TAKING: Amiodarone 200 mg by mouth twice a day for 3 weeks, then decrease to 200 mg daily thereafter. Hydralazine 25 mg by mouth every 8 hours as needed for a systolic (top number) greater than 160  *If you need a refill on your cardiac medications before your next appointment, please call your pharmacy*   Lab Work: No labs ordered today    Testing/Procedures: No test ordered today    Follow-Up: At [facility], you and your health needs are our priority.  As part of our continuing mission to provide you with exceptional heart care, we have created designated Provider Care Teams.  These Care Teams include your primary Cardiologist (physician) and Advanced Practice Providers (APPs -  Physician Assistants and Nurse Practitioners) who all work together to provide you with the care you need, when you need it.  We recommend signing up for the patient portal called "MyChart".  Sign up information is provided on this After Visit Summary.  MyChart is used to connect with patients for Virtual Visits (Telemedicine).  Patients are able to view lab/test results, encounter notes, upcoming appointments, etc.  Non-urgent messages can be sent to your provider as well.   To learn more about what you can do with MyChart, go to [email or web address].    Your next appointment:   June 21, 2023 @ 3:40 pm  Provider:   Steffanie Dunn, MD

## 2023-04-06 NOTE — Progress Notes (Signed)
Cardiology Office Note:  .   Date:  04/08/2023  ID:  Rebecca Stanley, DOB 03-10-1931, MRN [id number] PCP: Gordan Payment., MD  [facility] Providers Cardiologist:  Yvonne Kendall, MD Electrophysiologist:  Lanier Prude, MD     History of Present Illness: .   Rebecca Stanley is a 89 y.o. female with history of chronic HFpEF, mitral valve prolapse, hypertension, hyperlipidemia, MGUS under surveillance at [facility], anxiety/depression, and chronic back pain, who presents for follow-up of hypertension, palpitations, and fatigue.  She was seen by Terrilee Croak, PA, in October after having presented to the ER earlier in the month for tachycardia.  She was noted to be in atrial tachycardia versus atrial flutter, which resolved with IV diltiazem.  She subsequently saw Dr. Lalla Brothers, who recommended transitioning to extended release diltiazem and titrating off metoprolol.  She could continue to use short acting diltiazem for breakthrough symptoms.  She was seen in EP follow-up by Sherie Don, NP, 2 weeks ago, at which time she reported no further episodes of palpitations since mid December when she had another ED visit.  Blood pressures were frequently running high at home.  Blood pressure was also 150/64 in the office.  No medication changes were made.  Her son reached out to [place] earlier this week, reporting recurrent episode of tachycardia and elevated blood pressure readings.  Today, [name] reports that she has been feeling well.  However, she had another episode of palpitations/tachycardia last Friday while in his shop with her son.  Palpitations gradually resolved after 2 doses of diltiazem.  She did not need to go to the ER that time.  She denies chest pain and shortness of breath.  She has not been lightheaded or passed out.  She has had a couple of falls over the last few weeks, which sound mechanical.  Home blood pressure readings remain quite elevated, to the point where [name] is using as  needed hydralazine prescribed by her PCP at times.  She is no longer on HCTZ due to associated hyponatremia/hypokalemia.  She recently began using lymphedema wraps for her chronic leg swelling and has noticed significant improvement.  She is scheduled for follow-up with a shoulder surgeon at [facility] in the next month or two to discuss ongoing management of her chronic shoulder pain.  ROS: See HPI  Studies Reviewed: Marland Kitchen   EKG Interpretation Date/Time:  Thursday April 06 2023 16:29:07 EST Ventricular Rate:  60 PR Interval:  166 QRS Duration:  92 QT Interval:  442 QTC Calculation: 442 R Axis:   23  Text Interpretation: Normal sinus rhythm Incomplete right bundle branch block When compared with ECG of 23-Mar-2023 14:51, No significant change was found Confirmed by Fleetwood Pierron, Cristal Deer [phone]) on 04/08/2023 11:25:28 AM    Event monitor (12/30/2022): Predominantly sinus rhythm with rare PACs and PVCs as well as a few brief runs of PSVT.  No sustained arrhythmia observed.  Risk Assessment/Calculations:          Physical Exam:   VS:  BP (!) 141/72 (BP Location: Left Arm, Patient Position: Sitting, Cuff Size: Normal)   Pulse 60   Ht 5\' 1"  (1.549 m)   Wt 153 lb 12.8 oz (69.8 kg)   SpO2 93%   BMI 29.06 kg/m    Wt Readings from Last 3 Encounters:  04/06/23 153 lb 12.8 oz (69.8 kg)  03/23/23 157 lb 3.2 oz (71.3 kg)  03/02/23 155 lb (70.3 kg)    General:  NAD.  Need by her son. Neck: No JVD or HJR. Lungs: Clear to auscultation bilaterally without wheezes or crackles. Heart: Regular rate and rhythm without murmurs, rubs, or gallops. Abdomen: Soft, nontender, nondistended. Extremities: No lower extremity edema.  ASSESSMENT AND PLAN: .    SVT: [name] has had repeated episodes of supraventricular tachycardia underlying her palpitations.  Symptoms have not resolved with extended release diltiazem.  She typically is able to abort the episodes after taking short acting diltiazem.  Given her  resting heart rate in the 50s to low 60s at today's visit and at her recent visit with Ms. Clare Gandy, I am reluctant to increase her long-acting diltiazem any further.  We have discussed risks and benefits of adding amiodarone, which was previously recommended by Dr. Lalla Brothers as next-line therapy, and have agreed to begin this medication.  I will start her on amiodarone 200 mg twice daily for 3 weeks, after which time amiodarone can be reduced to 200 mg daily.  She should continue taking diltiazem XR 120 mg daily as well as short acting diltiazem for breakthrough symptoms.  [name] is already scheduled for follow-up with Dr. Lalla Brothers in April.  Labile hypertension: Blood pressure readings quite elevated at home but typically much better in the office.  Blood pressure is only mildly elevated in the office today.  I have advised [name] and her son that it would be appropriate to use as needed hydralazine 25 mg for systolic blood pressures greater than 160 mmHg.  Continue low-dose amlodipine and diltiazem.  Lower extremity edema: Leg swelling much improved with use of lymphedema wraps.    Dispo: Return to clinic in Dr. Lalla Brothers as scheduled in 06/2023.  Signed, Yvonne Kendall, MD

## 2023-04-08 ENCOUNTER — Encounter: Payer: Self-pay | Admitting: Internal Medicine

## 2023-04-08 DIAGNOSIS — I471 Supraventricular tachycardia, unspecified: Secondary | ICD-10-CM | POA: Insufficient documentation

## 2023-04-10 ENCOUNTER — Ambulatory Visit (HOSPITAL_BASED_OUTPATIENT_CLINIC_OR_DEPARTMENT_OTHER): Payer: Medicare Other | Admitting: Pulmonary Disease

## 2023-04-27 ENCOUNTER — Other Ambulatory Visit: Payer: Self-pay | Admitting: Internal Medicine

## 2023-05-09 ENCOUNTER — Ambulatory Visit (HOSPITAL_BASED_OUTPATIENT_CLINIC_OR_DEPARTMENT_OTHER)
Admit: 2023-05-09 | Discharge: 2023-05-09 | Disposition: A | Discharge status: Home / Self Care | Payer: Medicare Other | Attending: Physician Assistant | Admitting: Physician Assistant

## 2023-05-09 ENCOUNTER — Ambulatory Visit (HOSPITAL_BASED_OUTPATIENT_CLINIC_OR_DEPARTMENT_OTHER)
Admission: EM | Admit: 2023-05-09 | Discharge: 2023-05-09 | Disposition: A | Discharge status: Home / Self Care | Payer: Medicare Other

## 2023-05-09 ENCOUNTER — Encounter (HOSPITAL_BASED_OUTPATIENT_CLINIC_OR_DEPARTMENT_OTHER): Payer: Self-pay

## 2023-05-09 DIAGNOSIS — R051 Acute cough: Secondary | ICD-10-CM

## 2023-05-09 DIAGNOSIS — J329 Chronic sinusitis, unspecified: Secondary | ICD-10-CM | POA: Diagnosis not present

## 2023-05-09 DIAGNOSIS — R0981 Nasal congestion: Secondary | ICD-10-CM | POA: Diagnosis not present

## 2023-05-09 DIAGNOSIS — J4541 Moderate persistent asthma with (acute) exacerbation: Secondary | ICD-10-CM | POA: Diagnosis not present

## 2023-05-09 DIAGNOSIS — J40 Bronchitis, not specified as acute or chronic: Secondary | ICD-10-CM | POA: Diagnosis not present

## 2023-05-09 DIAGNOSIS — R059 Cough, unspecified: Secondary | ICD-10-CM | POA: Diagnosis not present

## 2023-05-09 LAB — POC COVID19/FLU A&B COMBO
Covid Antigen, POC: NEGATIVE
Influenza A Antigen, POC: NEGATIVE
Influenza B Antigen, POC: NEGATIVE

## 2023-05-09 MED ORDER — AMOXICILLIN-POT CLAVULANATE 500-125 MG PO TABS: 1.0000 | ORAL_TABLET | Freq: Two times a day (BID) | ORAL | 0 refills | Status: DC | PRN

## 2023-05-09 MED ORDER — FLUTICASONE PROPIONATE HFA 110 MCG/ACT IN AERO
1.0000 | INHALATION_SPRAY | Freq: Two times a day (BID) | RESPIRATORY_TRACT | 0 refills | Status: DC
Start: 2023-05-09 — End: 2023-07-09

## 2023-05-09 MED ORDER — IPRATROPIUM-ALBUTEROL 0.5-2.5 (3) MG/3ML IN SOLN
3.0000 mL | Freq: Once | RESPIRATORY_TRACT | Status: AC
  Administered 2023-05-09: 3 mL via RESPIRATORY_TRACT

## 2023-05-09 NOTE — ED Provider Notes (Signed)
 [facility]    CSN: [id number] Arrival date & time: 05/09/23  1125      History   Chief Complaint Chief Complaint  Patient presents with   Cough   Nasal Congestion    HPI Rebecca Stanley is a 89 y.o. female.   Patient presents today companied by her son who help provide majority of history.  Reports a weeklong history of URI symptoms including congestion, headache, sinus pressure, cough.  Denies any fever, chest pain, shortness of breath, nausea, vomiting.  She has had some wheezing and so has been using her albuterol at home which has provided only temporary relief of symptoms.  She is followed by pulmonology and has an appointment tomorrow (05/10/2023) for recheck due to previous pneumonia with associated acute respiratory failure.  Denies any known sick contacts.  She has been taking Mucinex without improvement of symptoms.  She denies any recent antibiotics or steroids.    Past Medical History:  Diagnosis Date   (HFpEF) heart failure with preserved ejection fraction (HCC)    a. 09/2020 Echo: EF 55-60%, no rwma, Gr1DD, nl RV size/fxn. RVSP 48.57mmHg. Mild LAE. Mild MR.   Allergy    Arthritis    Blood transfusion    Cancer [facility])    Colitis    Colon polyp    Depression    Diverticulosis    Heart murmur    History of cardiac catheterization    a. 05/2016 Cath ([place]): Nl cors. EF 65%. LVEDP .   History of stress test    a. 11/2018 MV: EF 55-65%, no ischemia/infarct.   Hyperlipidemia    Hypertension    Mitral valve prolapse    a. 09/2020 Echo: Mild MR.   Osteoporosis    Palpitations    a. 07/2020 reported tachycardia-->08/2020 Zio: RSR 78 (49-143). Rare PACs/PVCs. 3 atrial runs up to 10 beats, max 162 bpm. Triggered events = sinus rhythm.   Thyroid disease     Patient Active Problem List   Diagnosis Date Noted   Supraventricular tachycardia (HCC) 04/08/2023   Acute on chronic diastolic CHF (congestive heart failure) (HCC) 09/21/2022   Peripheral  neuropathy 09/21/2022   Hypothyroidism 09/21/2022   Acute bronchitis with bronchospasm 09/21/2022   Acute respiratory failure with hypoxia (HCC) 09/20/2022   Syncope 01/20/2021   Palpitations 08/07/2020   Leg edema 12/27/2018   Chronic fatigue 08/18/2017   Uncontrolled hypertension 07/11/2017   Shortness of breath 08/16/2010   Mitral valve prolapse    Mixed hyperlipidemia    DIARRHEA 04/05/2010   Diverticulosis of colon 04/02/2010   CONSTIPATION, CHRONIC 04/02/2010   History of colonic polyps 04/02/2010    Past Surgical History:  Procedure Laterality Date   BELPHAROPTOSIS REPAIR     bladder tack     CATARACT EXTRACTION     COLONOSCOPY     DILATION AND CURETTAGE, DIAGNOSTIC / THERAPEUTIC     gluteous medius tendon rupture     done at [facility]   heel tendon repair     HIP ARTHROSCOPY W/ LABRAL REPAIR     kyploplasty     T12, L1, L2, L3, done at [facility]   PARTIAL HYSTERECTOMY      OB History   No obstetric history on file.      Home Medications    Prior to Admission medications   Medication Sig Start Date End Date Taking? Authorizing Provider  Acetaminophen Extra Strength 500 MG TABS Take 1,000 mg by mouth in the morning and at  bedtime. 08/09/22  Yes [provider]  albuterol (VENTOLIN HFA) 108 (90 Base) MCG/ACT inhaler Inhale 2 puffs into the lungs every 6 (six) hours as needed for wheezing or shortness of breath. 09/23/22  Yes Regalado, Belkys A, MD  amiodarone (PACERONE) 200 MG tablet Take 1 tablet (200 mg total) by mouth 2 (two) times daily. For 3 weeks 04/06/23  Yes End, Cristal Deer, MD  amiodarone (PACERONE) 200 MG tablet Take 1 tablet (200 mg total) by mouth daily. 04/06/23  Yes End, Cristal Deer, MD  amitriptyline (ELAVIL) 10 MG tablet Take 10 mg by mouth at bedtime.   Yes [provider]  amLODipine (NORVASC) 2.5 MG tablet Take 1 tablet (2.5 mg total) by mouth at bedtime. 02/22/23 05/23/23 Yes Furth, Cadence H, PA-C  amoxicillin-clavulanate (AUGMENTIN)  500-125 MG tablet Take 1 tablet by mouth 2 (two) times daily as needed. 05/09/23  Yes Cherelle Midkiff, Noberto Retort, PA-C  aspirin 81 MG chewable tablet Chew 81 mg by mouth 2 (two) times daily. 08/09/22  Yes [provider]  Cholecalciferol (VITAMIN D PO) Take 1 tablet by mouth daily.   Yes [provider]  clonazePAM (KLONOPIN) 0.5 MG tablet Take 0.25-0.5 mg by mouth at bedtime.   Yes [provider]  cyanocobalamin 1000 MCG tablet Take 1 tablet (1,000 mcg total) by mouth daily. 09/24/22  Yes Regalado, Belkys A, MD  diltiazem (CARDIZEM CD) 120 MG 24 hr capsule Take 1 capsule (120 mg total) by mouth daily. 02/08/23  Yes Lanier Prude, MD  diltiazem (CARDIZEM) 30 MG tablet Take 1 tablet (30 mg total) by mouth 4 (four) times daily as needed (for tachycardia and palpitations that do not improve with regular meds, fluids, and rest). 12/16/22  Yes Tegeler, Canary Brim, MD  DULoxetine (CYMBALTA) 20 MG capsule Take 20 mg by mouth 2 (two) times daily. 05/13/20  Yes [provider]  fluticasone (FLOVENT HFA) 110 MCG/ACT inhaler Inhale 1 puff into the lungs in the morning and at bedtime. 05/09/23  Yes Leandra Vanderweele K, PA-C  GEMTESA 75 MG TABS Take 1 tablet by mouth at bedtime.   Yes [provider]  hydrALAZINE (APRESOLINE) 25 MG tablet Take 1 tablet (25 mg total) by mouth every 8 (eight) hours as needed (systolic (top number) greater than 160). 04/06/23  Yes End, Cristal Deer, MD  ipratropium-albuterol (DUONEB) 0.5-2.5 (3) MG/3ML SOLN Take 3 mLs by nebulization 3 (three) times daily. 09/23/22  Yes Regalado, Belkys A, MD  levothyroxine (SYNTHROID, LEVOTHROID) 25 MCG tablet Take 62.5 mcg by mouth daily.   Yes [provider]  mirtazapine (REMERON) 7.5 MG tablet Take 7.5 mg by mouth at bedtime.   Yes [provider]  nitroGLYCERIN (NITROSTAT) 0.4 MG SL tablet Place 1 tablet (0.4 mg total) under the tongue every 5 (five) minutes as needed for chest pain. Maximum of 3  doses. 08/06/20  Yes End, Cristal Deer, MD  polyethylene glycol (MIRALAX / GLYCOLAX) 17 g packet Take 1 packet by mouth daily. 08/09/22  Yes [provider]  pregabalin (LYRICA) 100 MG capsule Take 100 mg by mouth daily. At 4 pm   Yes [provider]  potassium chloride SA (KLOR-CON M) 10 MEQ tablet Take 1 tablet (10 mEq total) by mouth daily as needed. When you take lasix 07/07/22 03/23/23  Furth, Cadence H, PA-C    Family History Family History  Problem Relation Age of Onset   Heart disease Mother    Heart disease Other        aunt  Diabetes Other        grandmother   Breast cancer Other        aunt   Ovarian cancer Other        cousin   Rheum arthritis Father    Colon cancer Neg Hx     Social History Social History   Tobacco Use   Smoking status: Never   Smokeless tobacco: Never  Vaping Use   Vaping status: Never Used  Substance Use Topics   Alcohol use: No   Drug use: No     Allergies   Losartan, Amlodipine, Cholestyramine, Cortisone, Prednisone, Tramadol, and Lisinopril   Review of Systems Review of Systems  Constitutional:  Positive for activity change and fatigue. Negative for appetite change and fever.  HENT:  Positive for congestion and sinus pressure. Negative for sneezing and sore throat.   Respiratory:  Positive for cough.   Cardiovascular:  Negative for chest pain.  Gastrointestinal:  Negative for abdominal pain, diarrhea, nausea and vomiting.  Musculoskeletal:  Negative for arthralgias and myalgias.  Neurological:  Negative for dizziness, light-headedness and headaches.     Physical Exam Triage Vital Signs ED Triage Vitals [05/09/23 1203]  Encounter Vitals Group     BP (!) 178/74     Systolic BP Percentile      Diastolic BP Percentile      Pulse Rate 60     Resp 16     Temp 97.9 F (36.6 C)     Temp Source Oral     SpO2 93 %     Weight      Height      Head Circumference      Peak Flow      Pain Score      Pain Loc       Pain Education      Exclude from Growth Chart    No data found.  Updated Vital Signs BP (!) 178/74   Pulse 60   Temp 97.9 F (36.6 C) (Oral)   Resp 16   SpO2 93%   Visual Acuity Right Eye Distance:   Left Eye Distance:   Bilateral Distance:    Right Eye Near:   Left Eye Near:    Bilateral Near:     Physical Exam Vitals reviewed.  Constitutional:      General: She is awake. She is not in acute distress.    Appearance: Normal appearance. She is well-developed. She is not ill-appearing.     Comments: Very pleasant female appears stated age in no acute distress sitting comfortably in exam room  HENT:     Head: Normocephalic and atraumatic.     Right Ear: Tympanic membrane, ear canal and external ear normal. Tympanic membrane is not erythematous or bulging.     Left Ear: Tympanic membrane, ear canal and external ear normal. Tympanic membrane is not erythematous or bulging.     Mouth/Throat:     Pharynx: Uvula midline. Postnasal drip present. No oropharyngeal exudate or posterior oropharyngeal erythema.  Cardiovascular:     Rate and Rhythm: Normal rate and regular rhythm.     Heart sounds: Normal heart sounds, S1 normal and S2 normal. No murmur heard. Pulmonary:     Effort: Pulmonary effort is normal.     Breath sounds: Examination of the right-lower field reveals decreased breath sounds. Examination of the left-lower field reveals decreased breath sounds. Decreased breath sounds and wheezing present. No rhonchi or rales.     Comments: Scattered  wheezing Psychiatric:        Behavior: Behavior is cooperative.      UC Treatments / Results  Labs (all labs ordered are listed, but only abnormal results are displayed) Labs Reviewed  POC COVID19/FLU A&B COMBO - Normal    EKG   Radiology No results found.  Procedures Procedures (including critical care time)  Medications Ordered in UC Medications  ipratropium-albuterol (DUONEB) 0.5-2.5 (3) MG/3ML nebulizer solution  3 mL (3 mLs Nebulization Given 05/09/23 1327)    Initial Impression / Assessment and Plan / UC Course  I have reviewed the triage vital signs and the nursing notes.  Pertinent labs & imaging results that were available during my care of the patient were reviewed by me and considered in my medical decision making (see chart for details).     Patient is well-appearing, afebrile, nontoxic.  Viral testing was obtained and she was negative for flu and COVID.  Chest x-ray was obtained that showed no acute cardiopulmonary disease based on my primary read.  At the time of discharge we were waiting for radiologist over read and we will contact her if this differs and changes our treatment plan.  Her oxygen saturation fluctuated during the visit between 93 and 90%.  She was given DuoNeb with improvement of oxygen saturation persistently above 92%.  She has these at home and was encouraged to continue using them regularly to help manage her symptoms.  She is scheduled to see her pulmonologist tomorrow at 1130 and withdrawn encouraged to keep this appointment.  Will start Augmentin to cover for sinobronchitis.  No indication for dose adjustment based on metabolic panel from 03/02/2023 with creatinine of 0.75 and calculated creatinine clearance of 54.16 mL/min.  We did discuss potential utility of systemic steroids, however, she has had difficulty tolerating these in the past due to associated hypertension.  She is hypertensive in clinic so we will start inhaled corticosteroid in the hopes that this will provide improvement of breathing symptoms without as significant systemic side effects.  We discussed that they are to rinse mouth following use of this medication.  Her son, who accompanies her to visit today, has a pulse oximeter at home and will monitor her oxygen saturation.  We discussed that if this drops below 90% she needs to go to the emergency room.  We discussed the importance of following up with pulmonology  tomorrow.  Discussed that if anything worsens overnight they should go to the ER immediately to which they expressed understanding.  Strict return precautions given.  All questions were answered to family satisfaction.  Final Clinical Impressions(s) / UC Diagnoses   Final diagnoses:  Acute cough  Sinobronchitis  Moderate persistent asthma with acute exacerbation     Discharge Instructions      Start Augmentin twice daily for 7 days.  Continue her breathing treatments every 4-6 hours as needed.  Start Flovent twice daily.  Rinse her mouth following use of this medication to prevent thrush.  Monitor her oxygen saturation and if this drops below 90% she needs to go to the emergency room.  Follow-up with pulmonology tomorrow.  If anything worsens and she has worsening cough, shortness of breath, chest pain, nausea/vomiting interfering with oral intake, weakness, low oxygen she needs to go to the ER.     ED Prescriptions     Medication Sig Dispense Auth. Provider   amoxicillin-clavulanate (AUGMENTIN) 500-125 MG tablet Take 1 tablet by mouth 2 (two) times daily as needed. 14 tablet Glenice Ciccone,  Hannia Matchett K, PA-C   fluticasone (FLOVENT HFA) 110 MCG/ACT inhaler Inhale 1 puff into the lungs in the morning and at bedtime. 1 each Amalya Salmons, Noberto Retort, PA-C      PDMP not reviewed this encounter.   Jeani Hawking, PA-C 05/09/23 1354

## 2023-05-09 NOTE — ED Triage Notes (Signed)
 Sx 6 days  Got worse today- been doing breathing treatments. Has appointment with pulmonologist tomorrow.   Nasal congestion Doesn't feel good Cough

## 2023-05-09 NOTE — Discharge Instructions (Signed)
 Start Augmentin twice daily for 7 days.  Continue her breathing treatments every 4-6 hours as needed.  Start Flovent twice daily.  Rinse her mouth following use of this medication to prevent thrush.  Monitor her oxygen saturation and if this drops below 90% she needs to go to the emergency room.  Follow-up with pulmonology tomorrow.  If anything worsens and she has worsening cough, shortness of breath, chest pain, nausea/vomiting interfering with oral intake, weakness, low oxygen she needs to go to the ER.

## 2023-05-10 ENCOUNTER — Encounter (HOSPITAL_BASED_OUTPATIENT_CLINIC_OR_DEPARTMENT_OTHER): Payer: Self-pay | Admitting: Pulmonary Disease

## 2023-05-10 ENCOUNTER — Other Ambulatory Visit (HOSPITAL_COMMUNITY): Payer: Self-pay

## 2023-05-10 ENCOUNTER — Ambulatory Visit (HOSPITAL_BASED_OUTPATIENT_CLINIC_OR_DEPARTMENT_OTHER): Payer: Medicare Other | Admitting: Pulmonary Disease

## 2023-05-10 VITALS — BP 128/84 | HR 62 | Ht 61.0 in | Wt 154.8 lb

## 2023-05-10 DIAGNOSIS — J209 Acute bronchitis, unspecified: Secondary | ICD-10-CM

## 2023-05-10 MED ORDER — PREDNISONE 10 MG PO TABS: 20.0000 mg | ORAL_TABLET | Freq: Every day | ORAL | 0 refills | Status: AC

## 2023-05-10 MED ORDER — TRELEGY ELLIPTA 100-62.5-25 MCG/ACT IN AEPB: 1.0000 | INHALATION_SPRAY | Freq: Every day | RESPIRATORY_TRACT | Status: DC

## 2023-05-10 NOTE — Progress Notes (Signed)
 Subjective:   PATIENT ID: Rebecca Stanley GENDER: female DOB: 17-Dec-1930, MRN: [id number]   HPI  Chief Complaint  Patient presents with   Follow-up    Bronichitis    Reason for Visit: Follow-up  Ms. Rebecca Stanley is a 89 year old female never smoker with mitral valve proplapse, OA s/p right TKA 2024, depression, HLD, HTN who presents for follow-up.   Initial consult Previously followed by Dr. Craige Cotta. Last seen in 02/2018 She has had bronchitis that began 5-6 weeks ago. Seen in urgent care she received antibiotic and inhaler. Did not improved and returned to urgent care for nebulizer and IM steroid and IM antibiotics. CXR neg for pneumonia. On 3/16 and 3/29 for similar symptoms. Steroids seemed to improve. She followed up her PCP. She began having cough, shortness of breath after completing steroids. Returned to urgent care for additional steroids. After completing this two weeks ago, she has had no further symptoms.  She previously had COVID in 11/2020 that was severe. But improved after Paxlovid. She reports working 15 min daily. Awaiting evaluation for knee replacement. Also has chronic back pain and currently physical therapy. Reports significant fatigue since covid.  09/22/21 Since our last visit she reports she has been sleeping in upright/reclined position with improvement in breathing. Has not tried any inhalers. Currently not having cough or wheezing.  09/27/22 Since our last visit she had a right TKA in May and then presented to the ED for for shortness of breath cough and wheezing. Found to be hypoxemic requiring 2L O2. She was treated with nebulizer including Pulmicort and lasix and steroids. Weaned off oxygen. Since discharge she reports feeling weak. She tries to walk 6 rounds around the house. She is having shortness of breath and wheezing. Using albuterol nebulizer three times a day. But son who provides additional history states she is not active at all. Family med doctor has  started prednisone taper.  05/10/23 Son present. Since our last visit she reports malaise, cough, congestion for the last week. Had been performing breathing treatment at home for wheezing but also had treatment when she presented to the ED yesterday. CXR yesterday 05/09/23 neg for infiltrate  Of note she has been working with Cardiology for her afib and has had multiple ED visits related to this. HR is now control with her current medications.  Social History: Second hand smoke exposure Wood burning stove  Past Medical History:  Diagnosis Date   (HFpEF) heart failure with preserved ejection fraction (HCC)    a. 09/2020 Echo: EF 55-60%, no rwma, Gr1DD, nl RV size/fxn. RVSP 48.64mmHg. Mild LAE. Mild MR.   Allergy    Arthritis    Blood transfusion    Cancer [facility])    Colitis    Colon polyp    Depression    Diverticulosis    Heart murmur    History of cardiac catheterization    a. 05/2016 Cath ([place]): Nl cors. EF 65%. LVEDP .   History of stress test    a. 11/2018 MV: EF 55-65%, no ischemia/infarct.   Hyperlipidemia    Hypertension    Mitral valve prolapse    a. 09/2020 Echo: Mild MR.   Osteoporosis    Palpitations    a. 07/2020 reported tachycardia-->08/2020 Zio: RSR 78 (49-143). Rare PACs/PVCs. 3 atrial runs up to 10 beats, max 162 bpm. Triggered events = sinus rhythm.   Thyroid disease      Family History  Problem Relation Age of Onset  Heart disease Mother    Heart disease Other        aunt   Diabetes Other        grandmother   Breast cancer Other        aunt   Ovarian cancer Other        cousin   Rheum arthritis Father    Colon cancer Neg Hx      Social History   Occupational History   Occupation: retired  Tobacco Use   Smoking status: Never   Smokeless tobacco: Never  Vaping Use   Vaping status: Never Used  Substance and Sexual Activity   Alcohol use: No   Drug use: No   Sexual activity: Not on file    Allergies  Allergen Reactions   Losartan  Swelling    Facial swelling   Amlodipine Swelling    Tolerates 2.5mg .    Cholestyramine Other (See Comments)   Cortisone Other (See Comments)     High blood pressure    Prednisone Other (See Comments)    High blood pressure   Tramadol     Other reaction(s): Other (See Comments), Unknown Panic attacks with other meds that she is taking     Lisinopril Cough     Outpatient Medications Prior to Visit  Medication Sig Dispense Refill   Acetaminophen Extra Strength 500 MG TABS Take 1,000 mg by mouth in the morning and at bedtime.     albuterol (VENTOLIN HFA) 108 (90 Base) MCG/ACT inhaler Inhale 2 puffs into the lungs every 6 (six) hours as needed for wheezing or shortness of breath. 1 each 0   amiodarone (PACERONE) 200 MG tablet Take 1 tablet (200 mg total) by mouth 2 (two) times daily. For 3 weeks 42 tablet 0   amiodarone (PACERONE) 200 MG tablet Take 1 tablet (200 mg total) by mouth daily. 90 tablet 3   amitriptyline (ELAVIL) 10 MG tablet Take 10 mg by mouth at bedtime.     amLODipine (NORVASC) 2.5 MG tablet Take 1 tablet (2.5 mg total) by mouth at bedtime.     amoxicillin-clavulanate (AUGMENTIN) 500-125 MG tablet Take 1 tablet by mouth 2 (two) times daily as needed. 14 tablet 0   aspirin 81 MG chewable tablet Chew 81 mg by mouth 2 (two) times daily.     Cholecalciferol (VITAMIN D PO) Take 1 tablet by mouth daily.     clonazePAM (KLONOPIN) 0.5 MG tablet Take 0.25-0.5 mg by mouth at bedtime.     cyanocobalamin 1000 MCG tablet Take 1 tablet (1,000 mcg total) by mouth daily. 30 tablet 0   diltiazem (CARDIZEM CD) 120 MG 24 hr capsule Take 1 capsule (120 mg total) by mouth daily. 90 capsule 3   diltiazem (CARDIZEM) 30 MG tablet Take 1 tablet (30 mg total) by mouth 4 (four) times daily as needed (for tachycardia and palpitations that do not improve with regular meds, fluids, and rest). 30 tablet 0   DULoxetine (CYMBALTA) 20 MG capsule Take 20 mg by mouth 2 (two) times daily.     GEMTESA 75 MG  TABS Take 1 tablet by mouth at bedtime.     hydrALAZINE (APRESOLINE) 25 MG tablet Take 1 tablet (25 mg total) by mouth every 8 (eight) hours as needed (systolic (top number) greater than 160). 30 tablet 3   ipratropium-albuterol (DUONEB) 0.5-2.5 (3) MG/3ML SOLN Take 3 mLs by nebulization 3 (three) times daily. 360 mL 0   levothyroxine (SYNTHROID, LEVOTHROID) 25 MCG tablet Take 62.5 mcg  by mouth daily.     mirtazapine (REMERON) 7.5 MG tablet Take 7.5 mg by mouth at bedtime.     nitroGLYCERIN (NITROSTAT) 0.4 MG SL tablet Place 1 tablet (0.4 mg total) under the tongue every 5 (five) minutes as needed for chest pain. Maximum of 3 doses. 25 tablet 2   polyethylene glycol (MIRALAX / GLYCOLAX) 17 g packet Take 1 packet by mouth daily.     pregabalin (LYRICA) 100 MG capsule Take 100 mg by mouth daily. At 4 pm     fluticasone (FLOVENT HFA) 110 MCG/ACT inhaler Inhale 1 puff into the lungs in the morning and at bedtime. (Patient not taking: Reported on 05/10/2023) 1 each 0   potassium chloride SA (KLOR-CON M) 10 MEQ tablet Take 1 tablet (10 mEq total) by mouth daily as needed. When you take lasix 30 tablet 2   No facility-administered medications prior to visit.    Review of Systems  Constitutional:  Positive for malaise/fatigue. Negative for chills, diaphoresis, fever and weight loss.  HENT:  Positive for congestion.   Respiratory:  Positive for cough. Negative for hemoptysis, sputum production, shortness of breath and wheezing.   Cardiovascular:  Negative for chest pain, palpitations and leg swelling.     Objective:   Vitals:   05/10/23 1135  BP: 128/84  Pulse: 62  SpO2: 93%  Weight: 154 lb 12.8 oz (70.2 kg)  Height: 5\' 1"  (1.549 m)    SpO2: 93 %  Physical Exam: General: Well-appearing, no acute distress HENT: [place], AT Eyes: EOMI, no scleral icterus Respiratory: Clear to auscultation bilaterally.  No crackles, wheezing or rales Cardiovascular: RRR, -M/R/G, no  JVD Extremities:-Edema,-tenderness Neuro: AAO x4, CNII-XII grossly intact Psych: Normal mood, normal affect  Data Reviewed:  Imaging: CT Chest 04/20/18 - stable 5mm RML nodule compared to 2018. Bibasilar atelectasis. DDD s/p kyphoplasty CXR 06/09/21 - Left basilar atelectasis CTA 09/20/22 - No PE. Visualized lung parenchyma with no pulmonary nodules, masses, infiltrate, effusion or pneumothorax. Stable RML nodule compared to 2020. CXR 09/27/22 - No acute infiltrate effusion or edema  PFT: 09/22/21 FVC 1.15 (63%) FEV1 0.93 (70%) Ratio 85. No significant bronchodilator response however does not preclude benefit of therapy. Interpretation: No obstructive defect. Reduced FVC and FEV1 suggestive of poor effort vs restrictive defect.  Labs: CBC    Component Value Date/Time   WBC 8.2 03/02/2023 2053   RBC 4.80 03/02/2023 2053   HGB 13.1 03/02/2023 2053   HCT 40.5 03/02/2023 2053   PLT 367 03/02/2023 2053   MCV 84.4 03/02/2023 2053   MCH 27.3 03/02/2023 2053   MCHC 32.3 03/02/2023 2053   RDW 14.6 03/02/2023 2053   LYMPHSABS 1.7 03/02/2023 2053   MONOABS 1.0 03/02/2023 2053   EOSABS 0.4 03/02/2023 2053   BASOSABS 0.1 03/02/2023 2053      Assessment & Plan:   Discussion: 89 year old female never smoker with SVT, mitral valve prolapse, OA s/p right TKA, chronic diastolic heart failure, depression, HLD, HTN who presents for follow-up. Ambulatory O2 with no desaturations. Ongoing bronchitis symptoms.  Acute bronchitis - CXR neg for pneumonia History of severe acute bronchitis Deconditioning, post-op and recent infection --START prednisone 20 mg daily x 5 days (low dose due to concern for HTN) --START two week sample of Trelegy 100 ONE puff ONCE a day --CONTINUE Albuterol spray or nebulizer AS NEEDED for wheezing or shortness of breath    Health Maintenance Immunization History  Administered Date(s) Administered   Influenza Split 12/31/2014, 01/10/2017  Influenza, High Dose  Seasonal PF 12/26/2016, 12/27/2017, 12/23/2019, 01/29/2020, 01/07/2021   Influenza, Seasonal, Injecte, Preservative Fre 12/31/2014   Influenza,inj,Quad PF,6+ Mos 01/11/2016, 12/25/2018   Influenza,inj,Quad PF,6-35 Mos 01/11/2016   Influenza-Unspecified 12/31/2014, 12/23/2019   Moderna Covid-19 Fall Seasonal Vaccine 55yrs & older 12/07/2021   PFIZER Comirnaty(Gray Top)Covid-19 Tri-Sucrose Vaccine 03/25/2019, 04/15/2019, 11/28/2019, 01/29/2020, 06/11/2020   PFIZER(Purple Top)SARS-COV-2 Vaccination 11/07/2019, 01/29/2020   Pneumococcal Conjugate-13 07/10/2013   Pneumococcal Polysaccharide-23 03/14/2006, 01/29/2020   Unspecified SARS-COV-2 Vaccination 01/29/2020   Zoster Recombinant(Shingrix) 08/25/2017, 10/11/2017, 01/30/2018   Zoster, Live 03/14/2009    No orders of the defined types were placed in this encounter.  Meds ordered this encounter  Medications   predniSONE (DELTASONE) 10 MG tablet    Sig: Take 2 tablets (20 mg total) by mouth daily with breakfast for 5 days.    Dispense:  10 tablet    Refill:  0   Fluticasone-Umeclidin-Vilant (TRELEGY ELLIPTA) 100-62.5-25 MCG/ACT AEPB    Sig: Inhale 1 puff into the lungs daily.    Lot Number?:   [id number]    Expiration Date?:   02/11/2024    NDC:   1610-9604-54 [372260]    Return for March/April.   I have spent a total time of 32-minutes on the day of the appointment including chart review, data review, collecting history, coordinating care and discussing medical diagnosis and plan with the patient/family. Past medical history, allergies, medications were reviewed. Pertinent imaging, labs and tests included in this note have been reviewed and interpreted independently by me.  Zyir Gassert Mechele Collin, MD [facility] 05/10/2023 1:08 PM

## 2023-05-10 NOTE — Patient Instructions (Signed)
 Acute bronchitis - CXR neg for pneumonia History of severe acute bronchitis Deconditioning, post-op and recent infection --START prednisone 20 mg daily x 5 days --START two week sample of Trelegy 100 ONE puff ONCE a day --CONTINUE Albuterol spray or nebulizer AS NEEDED for wheezing or shortness of breath

## 2023-06-06 ENCOUNTER — Telehealth: Payer: Self-pay | Admitting: Internal Medicine

## 2023-06-06 NOTE — Telephone Encounter (Signed)
 The patient's son called to report that the patient has been experiencing elevated blood pressure. He noted that the increase is mostly observed in the evening, typically running in the 170s, and has risen as high as 199. He mentioned that two days ago, the patient's primary care provider recommended increasing the amlodipine dose to 5 mg at night. The son also reported that the patient required a dose of PRN hydralazine last night, but the blood pressure only decreased to 160. Additionally, the son expressed concern that the amiodarone may be causing the patient to feel exhausted and could be contributing to her lung issues.  Nurse scheduled pt an appointment for 06/09/23 for further evaluations.

## 2023-06-06 NOTE — Telephone Encounter (Signed)
 Pt c/o BP issue: STAT if pt c/o blurred vision, one-sided weakness or slurred speech  1. What are your last 5 BP readings? 187/68 last night, Friday night it was 199/66 but he hadn't checked it this today yet.  2. Are you having any other symptoms (ex. Dizziness, headache, blurred vision, passed out)? Friday she had a headache due to it being so high but no symptoms.   3. What is your BP issue? Pt's son has major concerns about her bp being high lately. He'd like to speak about what he was advised to do and stated it hasn't been working.

## 2023-06-09 ENCOUNTER — Ambulatory Visit: Attending: Medical | Admitting: Medical

## 2023-06-09 VITALS — BP 146/58 | HR 64 | Ht 61.0 in | Wt 154.5 lb

## 2023-06-09 DIAGNOSIS — I471 Supraventricular tachycardia, unspecified: Secondary | ICD-10-CM

## 2023-06-09 DIAGNOSIS — R600 Localized edema: Secondary | ICD-10-CM | POA: Diagnosis present

## 2023-06-09 DIAGNOSIS — I10 Essential (primary) hypertension: Secondary | ICD-10-CM | POA: Diagnosis present

## 2023-06-09 MED ORDER — HYDRALAZINE HCL 50 MG PO TABS: 50.0000 mg | ORAL_TABLET | Freq: Two times a day (BID) | ORAL | 3 refills | Status: DC

## 2023-06-09 MED ORDER — AMLODIPINE BESYLATE 5 MG PO TABS: 5.0000 mg | ORAL_TABLET | Freq: Every evening | ORAL | 3 refills | Status: DC

## 2023-06-09 NOTE — Progress Notes (Signed)
 Cardiology Office Note:  .   Date:  06/09/2023  ID:  Kendrick Fries, DOB 1930-06-30, MRN [id number] PCP: Gordan Payment., MD  [facility] Providers Cardiologist:  Yvonne Kendall, MD Electrophysiologist:  Lanier Prude, MD     History of Present Illness: .   Rebecca Stanley is a 89 y.o. female with a hx of HFpEF, mitral valve prolapse, HTN, HLD, chronic back pain who presents for follow-up for HTN and SVT.     She previously underwent diagnostic catheterization in March 2018 at [facility], showing normal coronaries with an EF of 65%.  She subsequently underwent stress testing in September 2020 showing EF 55 to 65% without ischemia or infarct.  She was recently evaluated by Dr. Okey Dupre in May of this year following hospitalization at [facility] for tachypalpitations and documented tachycardia.  Records have not been available for review.  Patient wore a Zio monitor in June of this year which did not show any significant arrhythmias.  Triggered events were associated with sinus rhythm.  Echocardiogram earlier this month showed an EF of 55 to 60%, grade 1 diastolic dysfunction, and an elevated RVSP at 48.2 mmHg.  In that setting, she was prescribed Lasix 20 mg daily as well as potassium chloride 10 mEq daily.   She was seen June 2023 reporting weakness and fatigue.  Patient was off Lasix due to low sodium levels.  She is on hydrochlorothiazide.  Patient had lower leg edema on exam, which seem to be a chronic issue.  Echo showed normal LVEF 60 to 65%, grade 1 diastolic dysfunction, trivial MR.   Patient was seen in April 2024 for preop evaluation for right knee replacement.  She had been seen in the [facility] ER for elevated heart rate.  Workup in the ER was overall normal.  She was started on metoprolol.  A 2-week heart monitor was ordered.  Heart monitor showed predominantly normal sinus rhythm, rare PACs and PVCs and several runs of SVT lasting up to 32 seconds.  Triggered events  responded with normal sinus rhythm.   Patient was admitted July 2024 with acute respiratory failure with hypoxia due to acute bronchitis, hypokalemia, acute on chronic diastolic heart failure and elevated blood pressure.  Echocardiogram showed LVEF 65 to 70%, moderate LVH, grade 1 diastolic dysfunction, trivial MR. She was given IV lasix, duonebs and steroids. She was sent home with lasix 20mg  PRN.  Patient was seen in the ER 10/4 for tachycardia. EKG showed atrial tachycardia vs aflutter. Cardiology was consulted who recommended a dilt bolus. The patient was given IVF and IV dilt and converted to NSR. She was discharged home.  Patient was seen in follow-up and referred to EP.  She saw EP who felt it was SVT, they recommended continuation of rate control.  Patient was last seen January 2025 for elevated blood pressures and intermittent palpitations.  Palpitations resolved with short acting diltiazem. She was started on amiodarone for better rate control.  Today, reports labile blood pressures. Systolics in the AM are 140s and at night 170-180s. Diastolics are consistently in the 60-70s. She has been taking hydralazine 25mg  in the early afternoon and at night, but feels it has not been helping. PCP increase the amlodipine.  She has headaches when BP gets very high. She denies chest pain or SOB. She denies ant further palpitations. She has not needed to take short acting diltiazem. He reports he decreased amiodarone to 50mg  daily due to fatigue and exhaustion. She  takes lasix as needed for PRN. She normally wears compression socks and wraps.    Studies Reviewed: .       Heart monitor 01/2023   The patient was monitored for 14 days.   The predominant rhythm was sinus with an average rate of 71 bpm (range 48-121 bpm in sinus).   There were rare PAC's and PVC's.   4 supraventricular runs were observed, lasting up to 9 beats with a maximum rate of 179 bpm.   No sustained arrhythmia or prolonged pause  occurred.   Patient triggered events correspond to sinus rhythm, PAC's, and artifact.   Predominantly sinus rhythm with rare PAC's and PVC's as well as a few brief runs of PSVT.  No sustained arrhythmia was observed.  Echo 09/2022  1. Left ventricular ejection fraction, by estimation, is 65 to 70%. The  left ventricle has normal function. The left ventricle has no regional  wall motion abnormalities. There is moderate concentric left ventricular  hypertrophy. Left ventricular  diastolic parameters are consistent with Grade I diastolic dysfunction  (impaired relaxation).   2. Right ventricular systolic function is normal. The right ventricular  size is normal. There is normal pulmonary artery systolic pressure.   3. Left atrial size was mildly dilated.   4. The mitral valve is normal in structure. Trivial mitral valve  regurgitation. No evidence of mitral stenosis.   5. The aortic valve is normal in structure. Aortic valve regurgitation is  trivial. Aortic valve sclerosis/calcification is present, without any  evidence of aortic stenosis. Aortic valve area, by VTI measures 2.51 cm.  Aortic valve mean gradient measures   7.0 mmHg. Aortic valve Vmax measures 1.76 m/s.   6. The inferior vena cava is normal in size with greater than 50%  respiratory variability, suggesting right atrial pressure of 3 mmHg.    Heart monitor 07/2022    The patient was monitored for 13 days, 20 hours.   The predominant rhythm was sinus with an average rate of 79 bpm (range 49-123 bpm in sinus).   There were rare PAC's and PVC's.   7 supraventricular runs were observed, lasting up to 31.6 seconds with a maximum rate of 174 bpm.   No prolonged pause occurred.   Patient triggered events correspond to normal sinus rhythm.   Predominantly sinus rhythm with rare PAC's and PVC's.  Several runs of PSVT noted, lasting up to 32 seconds.     Physical Exam:   VS:  BP (!) 146/58 (BP Location: Left Arm, Patient  Position: Sitting, Cuff Size: Normal)   Pulse 64   Ht 5\' 1"  (1.549 m)   Wt 154 lb 8 oz (70.1 kg)   SpO2 91%   BMI 29.19 kg/m    Wt Readings from Last 3 Encounters:  06/09/23 154 lb 8 oz (70.1 kg)  05/10/23 154 lb 12.8 oz (70.2 kg)  04/06/23 153 lb 12.8 oz (69.8 kg)    GEN: Well nourished, well developed in no acute distress NECK: No JVD; No carotid bruits CARDIAC: RRR, no murmurs, rubs, gallops RESPIRATORY:  Clear to auscultation without rales, wheezing or rhonchi  ABDOMEN: Soft, non-tender, non-distended EXTREMITIES:  No edema; No deformity   ASSESSMENT AND PLAN: .    HTN Patient reports labile pressures with systolics as high as 190s at night.  Blood pressure today 146/58.  When blood pressure is elevated she has a headache and is unable to sleep.  She is taking diltiazem 120 mg in the morning and amlodipine  5 mg at night (this was recently increased by PCP).  She has been taking hydralazine 25 mg in the mid afternoon and at night.  I recommended increasing hydralazine to 50 mg twice a day.  SVT Patient has been taking diltiazem 120 mg daily and short acting diltiazem as needed for palpitations.She was started on amiodarone 200 mg twice daily for 3 weeks followed by 200 mg daily.   Since the last visit she denies any further palpitations or heart racing.  She has not needed the short acting diltiazem.  Due to heart rates in the 60s we are unable to increase diltiazem any further.   Patient's son has reduced amiodarone to 50 mg once a day due to generalized fatigue and GI issues.  Patient is seeing EP next week at which time Dr. Lalla Brothers can address amiodarone.  Chronic lower extremity edema This is chronic and unchanged.  She wears compression stockings and lymphedema wraps.  Patient takes Lasix as needed for lower leg edema.    Dispo: Follow-up in 3 months  Signed, Jamilett Ferrante David Stall, PA-C

## 2023-06-09 NOTE — Patient Instructions (Addendum)
 Medication Instructions:  Your physician recommends the following medication changes.  INCREASE: Hydralazine to 50 mg by mouth twice a day   *If you need a refill on your cardiac medications before your next appointment, please call your pharmacy*  Lab Work: No labs ordered today   Testing/Procedures: No test ordered today   Follow-Up: At [facility], you and your health needs are our priority.  As part of our continuing mission to provide you with exceptional heart care, our providers are all part of one team.  This team includes your primary Cardiologist (physician) and Advanced Practice Providers or APPs (Physician Assistants and Nurse Practitioners) who all work together to provide you with the care you need, when you need it.  Your next appointment:   3 months   Provider:   Cadence Lorna Few  We recommend signing up for the patient portal called "MyChart".  Sign up information is provided on this After Visit Summary.  MyChart is used to connect with patients for Virtual Visits (Telemedicine).  Patients are able to view lab/test results, encounter notes, upcoming appointments, etc.  Non-urgent messages can be sent to your provider as well.   To learn more about what you can do with MyChart, go to [email or web address].

## 2023-06-15 ENCOUNTER — Encounter (HOSPITAL_BASED_OUTPATIENT_CLINIC_OR_DEPARTMENT_OTHER): Payer: Self-pay | Admitting: Emergency Medicine

## 2023-06-15 ENCOUNTER — Other Ambulatory Visit: Payer: Self-pay

## 2023-06-15 ENCOUNTER — Emergency Department (HOSPITAL_BASED_OUTPATIENT_CLINIC_OR_DEPARTMENT_OTHER)
Admission: EM | Admit: 2023-06-15 | Discharge: 2023-06-15 | Disposition: A | Discharge status: Home / Self Care | Attending: Emergency Medicine | Admitting: Emergency Medicine

## 2023-06-15 DIAGNOSIS — F0390 Unspecified dementia without behavioral disturbance: Secondary | ICD-10-CM | POA: Insufficient documentation

## 2023-06-15 DIAGNOSIS — R42 Dizziness and giddiness: Secondary | ICD-10-CM | POA: Insufficient documentation

## 2023-06-15 DIAGNOSIS — R5383 Other fatigue: Secondary | ICD-10-CM | POA: Insufficient documentation

## 2023-06-15 DIAGNOSIS — R5381 Other malaise: Secondary | ICD-10-CM | POA: Diagnosis not present

## 2023-06-15 LAB — URINALYSIS, ROUTINE W REFLEX MICROSCOPIC
Bilirubin Urine: NEGATIVE
Glucose, UA: NEGATIVE mg/dL
Hgb urine dipstick: NEGATIVE
Ketones, ur: NEGATIVE mg/dL
Leukocytes,Ua: NEGATIVE
Nitrite: NEGATIVE
Protein, ur: NEGATIVE mg/dL
Specific Gravity, Urine: 1.01 (ref 1.005–1.030)
pH: 6 (ref 5.0–8.0)

## 2023-06-15 LAB — BASIC METABOLIC PANEL WITH GFR
Anion gap: 10 (ref 5–15)
BUN: 23 mg/dL (ref 8–23)
CO2: 27 mmol/L (ref 22–32)
Calcium: 9.3 mg/dL (ref 8.9–10.3)
Chloride: 100 mmol/L (ref 98–111)
Creatinine, Ser: 0.65 mg/dL (ref 0.44–1.00)
GFR, Estimated: >60 mL/min (ref >60)
Glucose, Bld: 104 mg/dL — ABNORMAL HIGH (ref 70–99)
Potassium: 3.9 mmol/L (ref 3.5–5.1)
Sodium: 137 mmol/L (ref 135–145)

## 2023-06-15 LAB — CBC
HCT: 38.2 % (ref 36.0–46.0)
Hemoglobin: 12.5 g/dL (ref 12.0–15.0)
MCH: 28.1 pg (ref 26.0–34.0)
MCHC: 32.7 g/dL (ref 30.0–36.0)
MCV: 85.8 fL (ref 80.0–100.0)
Platelets: 364 10*3/uL (ref 150–400)
RBC: 4.45 MIL/uL (ref 3.87–5.11)
RDW: 15.3 % (ref 11.5–15.5)
WBC: 9.1 10*3/uL (ref 4.0–10.5)
nRBC: 0 % (ref 0.0–0.2)

## 2023-06-15 LAB — RESP PANEL BY RT-PCR (RSV, FLU A&B, COVID)  RVPGX2
Influenza A by PCR: NEGATIVE
Influenza B by PCR: NEGATIVE
Resp Syncytial Virus by PCR: NEGATIVE
SARS Coronavirus 2 by RT PCR: NEGATIVE

## 2023-06-15 LAB — CBG MONITORING, ED: Glucose-Capillary: 99 mg/dL (ref 70–99)

## 2023-06-15 NOTE — ED Provider Notes (Signed)
 [place] [facility] Provider Note   CSN: [id number] Arrival date & time: 06/15/23  1741     History Chief Complaint  Patient presents with   Dizziness    HPI Rebecca Stanley is a 89 y.o. female presenting for generalized malaise and fatigue. Endorsed dizziness throughout the day to the caregiver. Took Hydralazine (newly increased to 100TID from 25TID) and had significant worsening. Also had Diltiazem increased recently. Taken off lyrica recently as well.  Patient's recorded medical, surgical, social, medication list and allergies were reviewed in the Snapshot window as part of the initial history.   Review of Systems   Review of Systems  Unable to perform ROS: Dementia    Physical Exam Updated Vital Signs BP (!) 152/112 (BP Location: Left Arm)   Pulse 74   Temp 98.6 F (37 C)   Resp (!) 24   Wt 70.3 kg   SpO2 100%   BMI 29.29 kg/m  Physical Exam Constitutional:      General: She is not in acute distress.    Appearance: She is not ill-appearing or toxic-appearing.  HENT:     Head: Normocephalic and atraumatic.  Eyes:     Extraocular Movements: Extraocular movements intact.     Pupils: Pupils are equal, round, and reactive to light.  Cardiovascular:     Rate and Rhythm: Normal rate.  Pulmonary:     Effort: No respiratory distress.  Abdominal:     General: Abdomen is flat.  Musculoskeletal:        General: No swelling, deformity or signs of injury.     Cervical back: Normal range of motion. No rigidity.  Skin:    General: Skin is warm and dry.  Neurological:     General: No focal deficit present.     Mental Status: She is alert. Mental status is at baseline.  Psychiatric:        Mood and Affect: Mood normal.      ED Course/ Medical Decision Making/ A&P    Procedures Procedures   Medications Ordered in ED Medications - No data to display Medical Decision Making:   Rebecca Stanley is a 89 y.o. female who presented  to the ED today with altered mental status detailed above.    Additional history discussed with patient's family/caregivers.  Patient placed on continuous vitals and telemetry monitoring while in ED which was reviewed periodically.  Complete initial physical exam performed, notably the patient  was HDS in NAD.    Reviewed and confirmed nursing documentation for past medical history, family history, social history.    Initial Assessment:   With the patient's presentation of altered mental status, most likely diagnosis is delerium 2/2 infectious etiology (UTI/CAP/URI) vs metabolic abnormality (Na/K/Mg/Ca) vs nonspecific etiology. Other diagnoses were considered including (but not limited to) CVA, ICH, intracranial mass, critical dehydration, heptatic dysfunction, uremia, hypercarbia, intoxication, endrocrine abnormality, toxidrome. These are considered less likely due to history of present illness and physical exam findings.   This is most consistent with an acute life/limb threatening illness complicated by underlying chronic conditions.  Initial Plan:  Screening labs including CBC and Metabolic panel to evaluate for infectious or metabolic etiology of disease.  Viral testing for infectious disease evaluation.  Urinalysis with reflex culture ordered to evaluate for UTI or relevant urologic/nephrologic pathology.  Objective evaluation as below reviewed   Initial Study Results:   Laboratory  All laboratory results reviewed without evidence of clinically relevant pathology.  Final Assessment and Plan:   Observed for 3 hours. Now denying any symptoms.  Shaking episode resolved.  Disposition:  I have considered need for hospitalization, however, considering all of the above, I believe this patient is stable for discharge at this time.  Patient/family educated about specific return precautions for given chief complaint and symptoms.  Patient/family educated about follow-up with PCP.      Patient/family expressed understanding of return precautions and need for follow-up. Patient spoken to regarding all imaging and laboratory results and appropriate follow up for these results. All education provided in verbal form with additional information in written form. Time was allowed for answering of patient questions. Patient discharged.    Emergency Department Medication Summary:   Medications - No data to display     Clinical Impression:  1. Lightheadedness      Discharge   Final Clinical Impression(s) / ED Diagnoses Final diagnoses:  Lightheadedness    Rx / DC Orders ED Discharge Orders     None         Glyn Ade, MD 06/15/23 2055

## 2023-06-15 NOTE — ED Triage Notes (Signed)
 Son with patient , reporting dizziness that started today , had breathing treatment at 330 today , tremors started after that . Also reports Hx hyponatremia with which she had tremors like today's . Hx dementia , oriented to self .

## 2023-06-20 NOTE — Progress Notes (Unsigned)
  Electrophysiology Office Follow up Visit Note:    Date:  06/21/2023   ID:  Rebecca Stanley, DOB 06-21-1930, MRN [id number]  PCP:  Gordan Payment., MD  [facility] Cardiologist:  Yvonne Kendall, MD  [facility] Electrophysiologist:  Lanier Prude, MD    Interval History:     Rebecca Stanley is a 89 y.o. female who presents for a follow up visit.   I last saw her 02/08/2024 for SVT. At that time she was on metoprolol and diltiazem as needed.   She saw Rebecca Stanley 03/23/2023. The patient takes Amiodarone. She saw Rebecca Stanley in clinic 06/09/2023. At that appointment she reported no episodes of tachycardia on the amiodarone. The patient's son reduced her dose of Amio to 50mg  by mouth once daily.  She was still using wraps of her legs to treat lymphedema.  She is with her son today in clinic.  Her heart rhythm has been quite stable recently on the amiodarone.  She is tolerating the 50 mg by mouth once daily.  She is planning on having shoulder replacement surgery.  They are planning to do a nerve block.       Past medical, surgical, social and family history were reviewed.  ROS:   Please see the history of present illness.    All other systems reviewed and are negative.  EKGs/Labs/Other Studies Reviewed:    The following studies were reviewed today:  06/15/2023 ECG shows sinus rhythm.  September 21, 2022 echo EF 65% RV normal Trivial MR Trivial AI       Physical Exam:    VS:  BP 132/60   Pulse 67   Ht 5\' 1"  (1.549 m)   Wt 156 lb (70.8 kg)   SpO2 92%   BMI 29.48 kg/m     Wt Readings from Last 3 Encounters:  06/21/23 156 lb (70.8 kg)  06/15/23 155 lb (70.3 kg)  06/09/23 154 lb 8 oz (70.1 kg)     GEN: no distress.  Elderly, CARD: RRR, No MRG RESP: No IWOB. CTAB.      ASSESSMENT:    1. Paroxysmal SVT (supraventricular tachycardia) (HCC)   2. Encounter for long-term (current) use of high-risk medication   3. Essential hypertension    PLAN:    In order of problems  listed above:  #SVT #High risk med monitoring - amiodarone needs CMP, TSH and FT4 today.  Will add a CBC for preop. Continue amiodarone 50mg  by mouth once daily Continue diltiazem 120mg  PO daily Continue short acting dilt as needed.   #Preop risk restratification Rebecca Stanley perioperative risk of a major cardiac event is 0.4% according to the Revised Cardiac Risk Index (RCRI).  Therefore, she is at low risk for perioperative complications.   Her functional capacity is fair. According to ACC/AHA guidelines, no further cardiovascular testing needed.  The patient may proceed to surgery at acceptable risk.   While she is technically at acceptable risk for surgery, I do think her age puts her at a slightly increased risk of complication.  This was discussed in detail during today's clinic appointment.  She will discuss this further with her orthopedic surgeon.  Follow up EP APP in 6 months.   Signed, Steffanie Dunn, MD, [facility], [facility] 06/21/2023 3:42 PM    Electrophysiology [facility]

## 2023-06-21 ENCOUNTER — Encounter: Payer: Self-pay | Admitting: Cardiology

## 2023-06-21 ENCOUNTER — Ambulatory Visit: Payer: Medicare Other | Attending: Cardiology | Admitting: Cardiology

## 2023-06-21 ENCOUNTER — Other Ambulatory Visit: Payer: Self-pay

## 2023-06-21 VITALS — BP 132/60 | HR 67 | Ht 61.0 in | Wt 156.0 lb

## 2023-06-21 DIAGNOSIS — I471 Supraventricular tachycardia, unspecified: Secondary | ICD-10-CM

## 2023-06-21 DIAGNOSIS — Z79899 Other long term (current) drug therapy: Secondary | ICD-10-CM

## 2023-06-21 DIAGNOSIS — I10 Essential (primary) hypertension: Secondary | ICD-10-CM | POA: Diagnosis not present

## 2023-06-21 NOTE — Patient Instructions (Signed)
 Medication Instructions:  Your physician recommends that you continue on your current medications as directed. Please refer to the Current Medication list given to you today.  *If you need a refill on your cardiac medications before your next appointment, please call your pharmacy*  Lab Work: TODAY: CMET, TSH, T4, CBC  Follow-Up: At [facility], you and your health needs are our priority.  As part of our continuing mission to provide you with exceptional heart care, our providers are all part of one team.  This team includes your primary Cardiologist (physician) and Advanced Practice Providers or APPs (Physician Assistants and Nurse Practitioners) who all work together to provide you with the care you need, when you need it.  Your next appointment:   6 months  Provider:   Sherie Don, NP

## 2023-06-22 LAB — COMPREHENSIVE METABOLIC PANEL WITH GFR
ALT: 13 IU/L (ref 0–32)
AST: 22 IU/L (ref 0–40)
Albumin: 4.4 g/dL (ref 3.6–4.6)
Alkaline Phosphatase: 83 IU/L (ref 44–121)
BUN/Creatinine Ratio: 34 — ABNORMAL HIGH (ref 12–28)
BUN: 25 mg/dL (ref 10–36)
Bilirubin Total: 0.3 mg/dL (ref 0.0–1.2)
CO2: 22 mmol/L (ref 20–29)
Calcium: 9.9 mg/dL (ref 8.7–10.3)
Chloride: 99 mmol/L (ref 96–106)
Creatinine, Ser: 0.74 mg/dL (ref 0.57–1.00)
Globulin, Total: 2.7 g/dL (ref 1.5–4.5)
Glucose: 104 mg/dL — ABNORMAL HIGH (ref 70–99)
Potassium: 3.9 mmol/L (ref 3.5–5.2)
Sodium: 139 mmol/L (ref 134–144)
Total Protein: 7.1 g/dL (ref 6.0–8.5)
eGFR: 77 mL/min/{1.73_m2} (ref >59)

## 2023-06-22 LAB — CBC
Hematocrit: 39.9 % (ref 34.0–46.6)
Hemoglobin: 12.9 g/dL (ref 11.1–15.9)
MCH: 28.1 pg (ref 26.6–33.0)
MCHC: 32.3 g/dL (ref 31.5–35.7)
MCV: 87 fL (ref 79–97)
Platelets: 369 10*3/uL (ref 150–450)
RBC: 4.59 x10E6/uL (ref 3.77–5.28)
RDW: 14.5 % (ref 11.7–15.4)
WBC: 8.2 10*3/uL (ref 3.4–10.8)

## 2023-06-22 LAB — TSH: TSH: 2.13 u[IU]/mL (ref 0.450–4.500)

## 2023-06-22 LAB — T4, FREE: Free T4: 1.33 ng/dL (ref 0.82–1.77)

## 2023-06-25 DIAGNOSIS — E876 Hypokalemia: Secondary | ICD-10-CM | POA: Insufficient documentation

## 2023-06-25 DIAGNOSIS — I509 Heart failure, unspecified: Secondary | ICD-10-CM | POA: Diagnosis not present

## 2023-06-25 DIAGNOSIS — I110 Hypertensive heart disease with heart failure: Secondary | ICD-10-CM | POA: Diagnosis not present

## 2023-06-25 DIAGNOSIS — R002 Palpitations: Secondary | ICD-10-CM | POA: Diagnosis present

## 2023-06-25 NOTE — ED Triage Notes (Signed)
 Pt is here for tachycardia, son states her HR has been 152 since 1930 last night He states he has given Cardizem 105mg  total since 1930 No other symptoms

## 2023-06-26 ENCOUNTER — Emergency Department (HOSPITAL_BASED_OUTPATIENT_CLINIC_OR_DEPARTMENT_OTHER)

## 2023-06-26 ENCOUNTER — Emergency Department (HOSPITAL_BASED_OUTPATIENT_CLINIC_OR_DEPARTMENT_OTHER)
Admission: EM | Admit: 2023-06-26 | Discharge: 2023-06-26 | Disposition: A | Discharge status: Home / Self Care | Attending: Emergency Medicine | Admitting: Emergency Medicine

## 2023-06-26 ENCOUNTER — Encounter (HOSPITAL_BASED_OUTPATIENT_CLINIC_OR_DEPARTMENT_OTHER): Payer: Self-pay

## 2023-06-26 DIAGNOSIS — R002 Palpitations: Secondary | ICD-10-CM | POA: Diagnosis not present

## 2023-06-26 DIAGNOSIS — E876 Hypokalemia: Secondary | ICD-10-CM

## 2023-06-26 LAB — CBC WITH DIFFERENTIAL/PLATELET
Abs Immature Granulocytes: 0.05 10*3/uL (ref 0.00–0.07)
Basophils Absolute: 0.1 10*3/uL (ref 0.0–0.1)
Basophils Relative: 1 %
Eosinophils Absolute: 0.2 10*3/uL (ref 0.0–0.5)
Eosinophils Relative: 2 %
HCT: 42.9 % (ref 36.0–46.0)
Hemoglobin: 14.1 g/dL (ref 12.0–15.0)
Immature Granulocytes: 1 %
Lymphocytes Relative: 18 %
Lymphs Abs: 1.8 10*3/uL (ref 0.7–4.0)
MCH: 28.2 pg (ref 26.0–34.0)
MCHC: 32.9 g/dL (ref 30.0–36.0)
MCV: 85.8 fL (ref 80.0–100.0)
Monocytes Absolute: 1.1 10*3/uL — ABNORMAL HIGH (ref 0.1–1.0)
Monocytes Relative: 11 %
Neutro Abs: 6.8 10*3/uL (ref 1.7–7.7)
Neutrophils Relative %: 67 %
Platelets: 402 10*3/uL — ABNORMAL HIGH (ref 150–400)
RBC: 5 MIL/uL (ref 3.87–5.11)
RDW: 15.2 % (ref 11.5–15.5)
WBC: 10 10*3/uL (ref 4.0–10.5)
nRBC: 0 % (ref 0.0–0.2)

## 2023-06-26 LAB — BASIC METABOLIC PANEL WITH GFR
Anion gap: 12 (ref 5–15)
BUN: 22 mg/dL (ref 8–23)
CO2: 24 mmol/L (ref 22–32)
Calcium: 10 mg/dL (ref 8.9–10.3)
Chloride: 101 mmol/L (ref 98–111)
Creatinine, Ser: 0.78 mg/dL (ref 0.44–1.00)
GFR, Estimated: >60 mL/min (ref >60)
Glucose, Bld: 107 mg/dL — ABNORMAL HIGH (ref 70–99)
Potassium: 3.4 mmol/L — ABNORMAL LOW (ref 3.5–5.1)
Sodium: 137 mmol/L (ref 135–145)

## 2023-06-26 LAB — MAGNESIUM: Magnesium: 1.9 mg/dL (ref 1.7–2.4)

## 2023-06-26 MED ORDER — POTASSIUM CHLORIDE CRYS ER 20 MEQ PO TBCR
20.0000 meq | EXTENDED_RELEASE_TABLET | Freq: Once | ORAL | Status: AC
  Administered 2023-06-26: 20 meq via ORAL
  Filled 2023-06-26: qty 1

## 2023-06-26 NOTE — ED Provider Notes (Signed)
 [place] [facility] Provider Note   CSN: [id number] Arrival date & time: 06/25/23  2351     History  Chief Complaint  Patient presents with   Tachycardia    Rebecca Stanley is a 89 y.o. female.  The history is provided by the patient, medical records and a relative.  Rebecca Stanley is a 89 y.o. female who presents to the Emergency Department complaining of rapid heartbeat.  She presents to the emergency department accompanied by her son for evaluation of rapid heartbeat that started around 6:30 in the afternoon.  Her son measured her heart rate with a blood pressure cuff and it was between 140s and 150s on multiple checks.  She does have as needed 30 mg of diltiazem tablets and she received a total of 3-1/2 tablets between 6:30 in the evening and 10 10 in the evening.  No associated chest pain but she does have significant discomfort when her heart is beating quickly.  No recent medication changes.  No recent fevers, nausea, vomiting, diarrhea.  She has a history of CHF, SVT, hypertension.    Home Medications Prior to Admission medications   Medication Sig Start Date End Date Taking? Authorizing Provider  Acetaminophen Extra Strength 500 MG TABS Take 1,000 mg by mouth in the morning and at bedtime. 08/09/22   [provider]  albuterol (VENTOLIN HFA) 108 (90 Base) MCG/ACT inhaler Inhale 2 puffs into the lungs every 6 (six) hours as needed for wheezing or shortness of breath. 09/23/22   Regalado, Belkys A, MD  amiodarone (PACERONE) 200 MG tablet Take 1/4 tablet (50 mg) by mouth once daily    [provider]  amitriptyline (ELAVIL) 10 MG tablet Take 10 mg by mouth at bedtime.    [provider]  amLODipine (NORVASC) 5 MG tablet Take 1 tablet (5 mg total) by mouth at bedtime. 06/09/23 09/07/23  Furth, Cadence H, PA-C  amoxicillin-clavulanate (AUGMENTIN) 500-125 MG tablet Take 1 tablet by mouth 2 (two) times daily as needed. 05/09/23    Raspet, Erin K, PA-C  Cholecalciferol (VITAMIN D PO) Take 1 tablet by mouth daily.    [provider]  clonazePAM (KLONOPIN) 0.5 MG tablet Take 0.25-0.5 mg by mouth at bedtime.    [provider]  cyanocobalamin 1000 MCG tablet Take 1 tablet (1,000 mcg total) by mouth daily. 09/24/22   Regalado, Belkys A, MD  diltiazem (CARDIZEM CD) 120 MG 24 hr capsule Take 1 capsule (120 mg total) by mouth daily. 02/08/23   Boyce Byes, MD  diltiazem (CARDIZEM) 30 MG tablet Take 1 tablet (30 mg total) by mouth 4 (four) times daily as needed (for tachycardia and palpitations that do not improve with regular meds, fluids, and rest). 12/16/22   Tegeler, Marine Sia, MD  DULoxetine (CYMBALTA) 20 MG capsule Take 20 mg by mouth 2 (two) times daily. 05/13/20   [provider]  fluticasone (FLOVENT HFA) 110 MCG/ACT inhaler Inhale 1 puff into the lungs in the morning and at bedtime. 05/09/23   Raspet, Erin K, PA-C  Fluticasone-Umeclidin-Vilant (TRELEGY ELLIPTA) 100-62.5-25 MCG/ACT AEPB Inhale 1 puff into the lungs daily. 05/10/23   Quillian Brunt, MD  GEMTESA 75 MG TABS Take 1 tablet by mouth at bedtime.    [provider]  hydrALAZINE (APRESOLINE) 50 MG tablet Take 1 tablet (50 mg total) by mouth 2 (two) times daily. 06/09/23 09/07/23  Furth, Cadence H, PA-C  ipratropium-albuterol (DUONEB) 0.5-2.5 (3) MG/3ML SOLN Take  3 mLs by nebulization 3 (three) times daily. 09/23/22   Regalado, Belkys A, MD  levothyroxine (SYNTHROID, LEVOTHROID) 25 MCG tablet Take 62.5 mcg by mouth daily.    [provider]  mirtazapine (REMERON) 7.5 MG tablet Take 7.5 mg by mouth at bedtime.    [provider]  nitroGLYCERIN (NITROSTAT) 0.4 MG SL tablet Place 1 tablet (0.4 mg total) under the tongue every 5 (five) minutes as needed for chest pain. Maximum of 3 doses. 08/06/20   End, Veryl Gottron, MD  polyethylene glycol (MIRALAX / GLYCOLAX) 17 g packet Take 1 packet by mouth daily. 08/09/22    [provider]  potassium chloride SA (KLOR-CON M) 10 MEQ tablet Take 1 tablet (10 mEq total) by mouth daily as needed. When you take lasix 07/07/22 06/09/23  Furth, Cadence H, PA-C  pregabalin (LYRICA) 100 MG capsule Take 100 mg by mouth daily. At 4 pm    [provider]      Allergies    Losartan, Amlodipine, Cholestyramine, Cortisone, Prednisone, Tramadol, and Lisinopril    Review of Systems   Review of Systems  All other systems reviewed and are negative.   Physical Exam Updated Vital Signs BP (!) 124/48   Pulse (!) 58   Temp 97.7 F (36.5 C)   Resp 19   Ht 5\' 1"  (1.549 m)   Wt 70 kg   SpO2 91%   BMI 29.16 kg/m  Physical Exam Vitals and nursing note reviewed.  Constitutional:      Appearance: She is well-developed.  HENT:     Head: Normocephalic and atraumatic.  Cardiovascular:     Rate and Rhythm: Normal rate and regular rhythm.     Heart sounds: No murmur heard. Pulmonary:     Effort: Pulmonary effort is normal. No respiratory distress.     Breath sounds: Normal breath sounds.  Abdominal:     Palpations: Abdomen is soft.     Tenderness: There is no abdominal tenderness. There is no guarding or rebound.  Musculoskeletal:        General: No tenderness.     Comments: Trace edema to BLE  Skin:    General: Skin is warm and dry.  Neurological:     Mental Status: She is alert and oriented to person, place, and time.  Psychiatric:        Behavior: Behavior normal.     ED Results / Procedures / Treatments   Labs (all labs ordered are listed, but only abnormal results are displayed) Labs Reviewed  BASIC METABOLIC PANEL WITH GFR - Abnormal; Notable for the following components:      Result Value   Potassium 3.4 (*)    Glucose, Bld 107 (*)    All other components within normal limits  CBC WITH DIFFERENTIAL/PLATELET - Abnormal; Notable for the following components:   Platelets 402 (*)    Monocytes Absolute 1.1 (*)    All other components  within normal limits  MAGNESIUM    EKG None ED ECG REPORT   Date: 06/26/2023  Rate: 67  Rhythm: normal sinus rhythm  QRS Axis: normal  Intervals: normal  ST/T Wave abnormalities: normal  Conduction Disutrbances: incomplete right bundle branch block  Narrative Interpretation:   Old EKG Reviewed: none available  I have personally reviewed the EKG tracing and agree with the computerized printout as noted.  Radiology DG Chest 2 View Result Date: 06/26/2023 CLINICAL DATA:  Cardiac palpitations EXAM: CHEST - 2 VIEW COMPARISON:  05/09/23 FINDINGS: The heart  size and mediastinal contours are within normal limits. Both lungs are clear. The visualized skeletal structures are unremarkable. Changes of prior vertebral augmentation are noted. IMPRESSION: No active cardiopulmonary disease. Electronically Signed   By: Violeta Grey M.D.   On: 06/26/2023 01:27    Procedures Procedures    Medications Ordered in ED Medications  potassium chloride SA (KLOR-CON M) CR tablet 20 mEq (20 mEq Oral Given 06/26/23 0244)    ED Course/ Medical Decision Making/ A&P                                 Medical Decision Making Amount and/or Complexity of Data Reviewed Labs: ordered. Radiology: ordered.  Risk Prescription drug management.   Patient with history of SVT here for evaluation of palpitations, received her home Cardizem prior to arrival.  Patient is asymptomatic at time of ED evaluation, EKG with sinus rhythm.  Labs with mild hypokalemia.  No hypomag.  She did have potassium replacement orally.  She was observed for several hours without recurrent symptoms.  Feel she is stable for discharge with outpatient cardiology follow-up and return precautions.  Presentation is not consistent with acute decompensated CHF, PE, pneumonia.        Final Clinical Impression(s) / ED Diagnoses Final diagnoses:  Palpitations  Hypokalemia    Rx / DC Orders ED Discharge Orders     None          Kelsey Patricia, MD 06/26/23 [phone]

## 2023-06-27 ENCOUNTER — Other Ambulatory Visit: Payer: Self-pay | Admitting: Medical

## 2023-07-05 ENCOUNTER — Ambulatory Visit (HOSPITAL_BASED_OUTPATIENT_CLINIC_OR_DEPARTMENT_OTHER): Payer: Medicare Other | Admitting: Pulmonary Disease

## 2023-07-05 ENCOUNTER — Encounter (HOSPITAL_BASED_OUTPATIENT_CLINIC_OR_DEPARTMENT_OTHER): Payer: Self-pay | Admitting: Pulmonary Disease

## 2023-07-05 VITALS — BP 118/76 | HR 61 | Ht 61.0 in | Wt 155.7 lb

## 2023-07-05 DIAGNOSIS — Z01818 Encounter for other preprocedural examination: Secondary | ICD-10-CM | POA: Diagnosis not present

## 2023-07-05 DIAGNOSIS — R5381 Other malaise: Secondary | ICD-10-CM | POA: Diagnosis not present

## 2023-07-05 DIAGNOSIS — Z7722 Contact with and (suspected) exposure to environmental tobacco smoke (acute) (chronic): Secondary | ICD-10-CM

## 2023-07-05 DIAGNOSIS — J42 Unspecified chronic bronchitis: Secondary | ICD-10-CM

## 2023-07-05 NOTE — Patient Instructions (Signed)
  Peri-operative Assessment of Pulmonary Risk for Non-Thoracic Surgery:  For Ms. Rebecca Stanley, risk of perioperative pulmonary complications is increased by:  [ X]Age greater than 65 years  [ ] COPD  [ ] Serum albumin <3.5  [ ] Smoking  [ ] Obstructive sleep apnea  [ ] NYHA Class II Pulmonary Hypertension  ARISCAT: Low risk 1.6% risk of in-hospital post-op pulmonary complications (composite including respiratory failure, respiratory infection, pleural effusion, atelectasis, pneumothorax, bronchospasm, aspiration pneumonitis)  Respiratory complications generally occur in 1% of ASA Class I patients, 5% of ASA Class II and 10% of ASA Class III-IV patients These complications rarely result in mortality and include postoperative pneumonia, atelectasis, pulmonary embolism, ARDS and increased time requiring postoperative mechanical ventilation.  Overall, I recommend proceeding with the surgery if the risk for respiratory complications are outweighed by the potential benefits. This will need to be discussed between the patient and surgeon.  To reduce risks of respiratory complications, I recommend: --Pre- and post-operative incentive spirometry performed frequently while awake --Avoiding/minimizing use of paralytics if possible during anesthesia.  1) RISK FOR PROLONGED MECHANICAL VENTILAION - > 48h   1A) Arozullah - Prolonged mech ventilation risk Arozullah Postperative Pulmonary Risk Score - for mech ventilation dependence >48h USAA, Ann Surg 2000, major non-cardiac surgery) Comment Score  Type of surgery - abd ao aneurysm (27), thoracic (21), neurosurgery / upper abdominal / vascular (21), neck (11)    Emergency Surgery - (11)     ALbumin < 3 or poor nutritional state - (9)     BUN > 30 -  (8)    Partial or completely dependent functional status - (7)     COPD -  (6)    Age - 60 to 69 (4), > 70  (6) >70  6  TOTAL     Risk Stratifcation scores  - < 10 (0.5%), 11-19 (1.8%), 20-27 (4.2%),  28-40 (10.1%), >40 (26.6%)  < 10 (0.5%)       I have discussed the risk factors and recommendations above with the patient.

## 2023-07-05 NOTE — Progress Notes (Signed)
 Subjective:   PATIENT ID: Rebecca Stanley GENDER: female DOB: 1930/08/14, MRN: [id number]   HPI  Chief Complaint  Patient presents with   Follow-up    Chronic bronchitis    Reason for Visit: Follow-up  Ms. Rebecca Stanley is a 89 year old female never smoker with mitral valve proplapse, OA s/p right TKA 2024, depression, HLD, HTN who presents for follow-up.   Initial consult Previously followed by Dr. Matilde Son. Last seen in 02/2018 She has had bronchitis that began 5-6 weeks ago. Seen in urgent care she received antibiotic and inhaler. Did not improved and returned to urgent care for nebulizer and IM steroid and IM antibiotics. CXR neg for pneumonia. On 3/16 and 3/29 for similar symptoms. Steroids seemed to improve. She followed up her PCP. She began having cough, shortness of breath after completing steroids. Returned to urgent care for additional steroids. After completing this two weeks ago, she has had no further symptoms.  She previously had COVID in 11/2020 that was severe. But improved after Paxlovid. She reports working 15 min daily. Awaiting evaluation for knee replacement. Also has chronic back pain and currently physical therapy. Reports significant fatigue since covid.  09/22/21 Since our last visit she reports she has been sleeping in upright/reclined position with improvement in breathing. Has not tried any inhalers. Currently not having cough or wheezing.  09/27/22 Since our last visit she had a right TKA in May and then presented to the ED for for shortness of breath cough and wheezing. Found to be hypoxemic requiring 2L O2. She was treated with nebulizer including Pulmicort  and lasix  and steroids. Weaned off oxygen. Since discharge she reports feeling weak. She tries to walk 6 rounds around the house. She is having shortness of breath and wheezing. Using albuterol  nebulizer three times a day. But son who provides additional history states she is not active at all. Family med doctor  has started prednisone  taper.  05/10/23 Son present. Since our last visit she reports malaise, cough, congestion for the last week. Had been performing breathing treatment at home for wheezing but also had treatment when she presented to the ED yesterday. CXR yesterday 05/09/23 neg for infiltrate  Of note she has been working with Cardiology for her afib and has had multiple ED visits related to this. HR is now control with her current medications.  07/05/23 Since our last visit she trialed Trelegy with some improvement. Acute bronchitis symptoms resolved.  Denies shortness of breath, cough, wheezing.  Social History: Second hand smoke exposure Wood burning stove  Past Medical History:  Diagnosis Date   (HFpEF) heart failure with preserved ejection fraction (HCC)    a. 09/2020 Echo: EF 55-60%, no rwma, Gr1DD, nl RV size/fxn. RVSP 48.66mmHg. Mild LAE. Mild MR.   Allergy    Arthritis    Blood transfusion    Cancer [facility])    Colitis    Colon polyp    Depression    Diverticulosis    Heart murmur    History of cardiac catheterization    a. 05/2016 Cath ([place]): Nl cors. EF 65%. LVEDP .   History of stress test    a. 11/2018 MV: EF 55-65%, no ischemia/infarct.   Hyperlipidemia    Hypertension    Mitral valve prolapse    a. 09/2020 Echo: Mild MR.   Osteoporosis    Palpitations    a. 07/2020 reported tachycardia-->08/2020 Zio: RSR 78 (49-143). Rare PACs/PVCs. 3 atrial runs up to 10 beats, max  162 bpm. Triggered events = sinus rhythm.   Thyroid  disease      Family History  Problem Relation Age of Onset   Heart disease Mother    Heart disease Other        aunt   Diabetes Other        grandmother   Breast cancer Other        aunt   Ovarian cancer Other        cousin   Rheum arthritis Father    Colon cancer Neg Hx      Social History   Occupational History   Occupation: retired  Tobacco Use   Smoking status: Never   Smokeless tobacco: Never  Vaping Use   Vaping  status: Never Used  Substance and Sexual Activity   Alcohol use: No   Drug use: No   Sexual activity: Not on file    Allergies  Allergen Reactions   Losartan Swelling    Facial swelling   Amlodipine  Swelling    Tolerates 2.5mg .    Cholestyramine Other (See Comments)   Cortisone Other (See Comments)     High blood pressure    Prednisone  Other (See Comments)    High blood pressure   Tramadol     Other reaction(s): Other (See Comments), Unknown Panic attacks with other meds that she is taking     Lisinopril Cough     Outpatient Medications Prior to Visit  Medication Sig Dispense Refill   Acetaminophen  Extra Strength 500 MG TABS Take 1,000 mg by mouth in the morning and at bedtime.     albuterol  (VENTOLIN  HFA) 108 (90 Base) MCG/ACT inhaler Inhale 2 puffs into the lungs every 6 (six) hours as needed for wheezing or shortness of breath. 1 each 0   amiodarone  (PACERONE ) 200 MG tablet Take 1/4 tablet (50 mg) by mouth once daily     amLODipine  (NORVASC ) 5 MG tablet Take 1 tablet (5 mg total) by mouth at bedtime. 90 tablet 3   Cholecalciferol  (VITAMIN D  PO) Take 1 tablet by mouth daily.     clonazePAM  (KLONOPIN ) 0.5 MG tablet Take 0.25-0.5 mg by mouth at bedtime.     cyanocobalamin  1000 MCG tablet Take 1 tablet (1,000 mcg total) by mouth daily. 30 tablet 0   diltiazem  (CARDIZEM  CD) 120 MG 24 hr capsule Take 1 capsule (120 mg total) by mouth daily. 90 capsule 3   diltiazem  (CARDIZEM ) 30 MG tablet Take 1 tablet (30 mg total) by mouth 4 (four) times daily as needed (for tachycardia and palpitations that do not improve with regular meds, fluids, and rest). 30 tablet 0   DULoxetine  (CYMBALTA ) 20 MG capsule Take 20 mg by mouth 2 (two) times daily.     Fluticasone -Umeclidin-Vilant (TRELEGY ELLIPTA ) 100-62.5-25 MCG/ACT AEPB Inhale 1 puff into the lungs daily.     GEMTESA 75 MG TABS Take 1 tablet by mouth at bedtime.     hydrALAZINE  (APRESOLINE ) 50 MG tablet Take 1 tablet (50 mg total) by mouth  2 (two) times daily. 180 tablet 3   ipratropium-albuterol  (DUONEB) 0.5-2.5 (3) MG/3ML SOLN Take 3 mLs by nebulization 3 (three) times daily. 360 mL 0   levothyroxine  (SYNTHROID , LEVOTHROID) 25 MCG tablet Take 62.5 mcg by mouth daily.     mirtazapine (REMERON) 7.5 MG tablet Take 7.5 mg by mouth at bedtime.     nitroGLYCERIN  (NITROSTAT ) 0.4 MG SL tablet Place 1 tablet (0.4 mg total) under the tongue every 5 (five) minutes as needed for  chest pain. Maximum of 3 doses. 25 tablet 2   polyethylene glycol (MIRALAX / GLYCOLAX) 17 g packet Take 1 packet by mouth daily.     potassium chloride  (KLOR-CON  M) 10 MEQ tablet TAKE 1 TABLET (10 MEQ TOTAL) DAILY AS NEEDED (WHEN YOU TAKE LASIX ) 30 tablet 11   amitriptyline (ELAVIL) 10 MG tablet Take 10 mg by mouth at bedtime. (Patient not taking: Reported on 07/05/2023)     amoxicillin -clavulanate (AUGMENTIN ) 500-125 MG tablet Take 1 tablet by mouth 2 (two) times daily as needed. (Patient not taking: Reported on 07/05/2023) 14 tablet 0   fluticasone  (FLOVENT  HFA) 110 MCG/ACT inhaler Inhale 1 puff into the lungs in the morning and at bedtime. (Patient not taking: Reported on 07/05/2023) 1 each 0   pregabalin  (LYRICA ) 100 MG capsule Take 100 mg by mouth daily. At 4 pm (Patient not taking: Reported on 07/05/2023)     No facility-administered medications prior to visit.    Review of Systems  Constitutional:  Negative for chills, diaphoresis, fever, malaise/fatigue and weight loss.  HENT:  Negative for congestion.   Respiratory:  Negative for cough, hemoptysis, sputum production, shortness of breath and wheezing.   Cardiovascular:  Negative for chest pain, palpitations and leg swelling.     Objective:   Vitals:   07/05/23 1104  BP: 118/76  Pulse: 61  SpO2: 95%  Weight: 155 lb 11.2 oz (70.6 kg)  Height: 5\' 1"  (1.549 m)    SpO2: 95 %  Physical Exam: General: Well-appearing, no acute distress HENT: [place], AT Eyes: EOMI, no scleral icterus Respiratory: Clear to  auscultation bilaterally.  No crackles, wheezing or rales Cardiovascular: RRR, -M/R/G, no JVD Extremities:-Edema,-tenderness Neuro: AAO x4, CNII-XII grossly intact Psych: Normal mood, normal affect  Data Reviewed:  Imaging: CT Chest 04/20/18 - stable 5mm RML nodule compared to 2018. Bibasilar atelectasis. DDD s/p kyphoplasty CXR 06/09/21 - Left basilar atelectasis CTA 09/20/22 - No PE. Visualized lung parenchyma with no pulmonary nodules, masses, infiltrate, effusion or pneumothorax. Stable RML nodule compared to 2020. CXR 09/27/22 - No acute infiltrate effusion or edema CXR 06/26/23 - No acute infiltrate, effusion or edema  PFT: 09/22/21 FVC 1.15 (63%) FEV1 0.93 (70%) Ratio 85. No significant bronchodilator response however does not preclude benefit of therapy. Interpretation: No obstructive defect. Reduced FVC and FEV1 suggestive of poor effort vs restrictive defect.  Labs: CBC    Component Value Date/Time   WBC 10.0 06/26/2023 0039   RBC 5.00 06/26/2023 0039   HGB 14.1 06/26/2023 0039   HGB 12.9 06/21/2023 1550   HCT 42.9 06/26/2023 0039   HCT 39.9 06/21/2023 1550   PLT 402 (H) 06/26/2023 0039   PLT 369 06/21/2023 1550   MCV 85.8 06/26/2023 0039   MCV 87 06/21/2023 1550   MCH 28.2 06/26/2023 0039   MCHC 32.9 06/26/2023 0039   RDW 15.2 06/26/2023 0039   RDW 14.5 06/21/2023 1550   LYMPHSABS 1.8 06/26/2023 0039   MONOABS 1.1 (H) 06/26/2023 0039   EOSABS 0.2 06/26/2023 0039   BASOSABS 0.1 06/26/2023 0039      Assessment & Plan:   Discussion: 89 year old female never smoker with SVT, mitral valve prolapse, OA s/p right TKA, chronic diastolic heart failure, depression, HLD, HTN who presents for follow up. Stable symptoms with minimal bronchitis symptoms. Planning for right shoulder on 08/15/23. Reviewed post-op respiratory risks which is low. If she develops symptoms of pneumonia/bronchitis post-op, low threshold to treat.  Acute bronchitis - resolved. CXR neg for  pneumonia History of severe acute bronchitis Deconditioning, post-op and recent infection --CONTINUE Albuterol  spray or nebulizer AS NEEDED for wheezing or shortness of breath  Peri-operative Assessment of Pulmonary Risk for Non-Thoracic Surgery:  For Ms. Dragos, risk of perioperative pulmonary complications is increased by:  [ X]Age greater than 65 years  [ ] COPD  [ ] Serum albumin <3.5  [ ] Smoking  [ ] Obstructive sleep apnea  [ ] NYHA Class II Pulmonary Hypertension  ARISCAT: Low risk 1.6% risk of in-hospital post-op pulmonary complications (composite including respiratory failure, respiratory infection, pleural effusion, atelectasis, pneumothorax, bronchospasm, aspiration pneumonitis)  Respiratory complications generally occur in 1% of ASA Class I patients, 5% of ASA Class II and 10% of ASA Class III-IV patients These complications rarely result in mortality and include postoperative pneumonia, atelectasis, pulmonary embolism, ARDS and increased time requiring postoperative mechanical ventilation.  Overall, I recommend proceeding with the surgery if the risk for respiratory complications are outweighed by the potential benefits. This will need to be discussed between the patient and surgeon.  To reduce risks of respiratory complications, I recommend: --Pre- and post-operative incentive spirometry performed frequently while awake --Avoiding/minimizing use of paralytics if possible during anesthesia.  1) RISK FOR PROLONGED MECHANICAL VENTILAION - > 48h   1A) Arozullah - Prolonged mech ventilation risk Arozullah Postperative Pulmonary Risk Score - for mech ventilation dependence >48h USAA, Ann Surg 2000, major non-cardiac surgery) Comment Score  Type of surgery - abd ao aneurysm (27), thoracic (21), neurosurgery / upper abdominal / vascular (21), neck (11)    Emergency Surgery - (11)     ALbumin < 3 or poor nutritional state - (9)     BUN > 30 -  (8)    Partial or  completely dependent functional status - (7)     COPD -  (6)    Age - 60 to 69 (4), > 70  (6) >70  6  TOTAL     Risk Stratifcation scores  - < 10 (0.5%), 11-19 (1.8%), 20-27 (4.2%), 28-40 (10.1%), >40 (26.6%)  < 10 (0.5%)       I have discussed the risk factors and recommendations above with the patient.   Health Maintenance Immunization History  Administered Date(s) Administered   Influenza Split 12/31/2014, 01/10/2017   Influenza, High Dose Seasonal PF 12/26/2016, 12/27/2017, 12/23/2019, 01/29/2020, 01/07/2021   Influenza, Seasonal, Injecte, Preservative Fre 12/31/2014   Influenza,inj,Quad PF,6+ Mos 01/11/2016, 12/25/2018   Influenza,inj,Quad PF,6-35 Mos 01/11/2016   Influenza-Unspecified 12/31/2014, 12/23/2019   Moderna Covid-19 Fall Seasonal Vaccine 70yrs & older 12/07/2021   PFIZER Comirnaty(Gray Top)Covid-19 Tri-Sucrose Vaccine 03/25/2019, 04/15/2019, 11/28/2019, 01/29/2020, 06/11/2020   PFIZER(Purple Top)SARS-COV-2 Vaccination 11/07/2019, 01/29/2020   Pneumococcal Conjugate-13 07/10/2013   Pneumococcal Polysaccharide-23 03/14/2006, 01/29/2020   Unspecified SARS-COV-2 Vaccination 01/29/2020   Zoster Recombinant(Shingrix) 08/25/2017, 10/11/2017, 01/30/2018   Zoster, Live 03/14/2009    No orders of the defined types were placed in this encounter.  No orders of the defined types were placed in this encounter.   No follow-ups on file.   I have spent a total time of 32-minutes on the day of the appointment including chart review, data review, collecting history, coordinating care and discussing medical diagnosis and plan with the patient/family. Past medical history, allergies, medications were reviewed. Pertinent imaging, labs and tests included in this note have been reviewed and interpreted independently by me.  Itzael Liptak Genetta Kenning, MD [facility] 07/05/2023 11:44 AM

## 2023-07-09 ENCOUNTER — Encounter (HOSPITAL_BASED_OUTPATIENT_CLINIC_OR_DEPARTMENT_OTHER): Payer: Self-pay

## 2023-07-09 ENCOUNTER — Ambulatory Visit (HOSPITAL_BASED_OUTPATIENT_CLINIC_OR_DEPARTMENT_OTHER)
Admission: RE | Admit: 2023-07-09 | Discharge: 2023-07-09 | Disposition: A | Discharge status: Home / Self Care | Source: Ambulatory Visit | Attending: Family Medicine | Admitting: Family Medicine

## 2023-07-09 VITALS — BP 155/69 | HR 61 | Temp 98.7°F | Resp 20

## 2023-07-09 DIAGNOSIS — L299 Pruritus, unspecified: Secondary | ICD-10-CM

## 2023-07-09 MED ORDER — HYDROXYZINE HCL 10 MG PO TABS: 10.0000 mg | ORAL_TABLET | Freq: Every evening | ORAL | 0 refills | Status: DC | PRN

## 2023-07-09 NOTE — Discharge Instructions (Signed)
 I believe that this condition may be due to dry skin I have given some information about things that can help this week. Keep the skin very moisturized.  Try Eucerin, Cerave itch relief lotion Can do antihistamine like zyrtec.

## 2023-07-09 NOTE — ED Triage Notes (Signed)
 Patient reporting "itching" for a few days. No visible rash. Appears to be mostly on face and neck. No new meds, no new detergents.  Blood pressure med dosing have changed within the last month.

## 2023-07-09 NOTE — ED Provider Notes (Signed)
 Rebecca Stanley CARE    CSN: [id number] Arrival date & time: 07/09/23  1033      History   Chief Complaint Chief Complaint  Patient presents with   Pruritis    HPI Rebecca Stanley is a 89 y.o. female.   Patient is a 89 year old female who presents today with pruritus.  This has been ongoing issue worse over the past few days and was able to sleep last night due to the itching.  She tried treating with cool rag and moisturizing with lotion.  Rash with itching.  No new changes in lotions, soaps, detergents or new foods.      Past Medical History:  Diagnosis Date   (HFpEF) heart failure with preserved ejection fraction (HCC)    a. 09/2020 Echo: EF 55-60%, no rwma, Gr1DD, nl RV size/fxn. RVSP 48.61mmHg. Mild LAE. Mild MR.   Allergy    Arthritis    Blood transfusion    Cancer [facility])    Colitis    Colon polyp    Depression    Diverticulosis    Heart murmur    History of cardiac catheterization    a. 05/2016 Cath ([place]): Nl cors. EF 65%. LVEDP .   History of stress test    a. 11/2018 MV: EF 55-65%, no ischemia/infarct.   Hyperlipidemia    Hypertension    Mitral valve prolapse    a. 09/2020 Echo: Mild MR.   Osteoporosis    Palpitations    a. 07/2020 reported tachycardia-->08/2020 Zio: RSR 78 (49-143). Rare PACs/PVCs. 3 atrial runs up to 10 beats, max 162 bpm. Triggered events = sinus rhythm.   Thyroid  disease     Patient Active Problem List   Diagnosis Date Noted   Supraventricular tachycardia (HCC) 04/08/2023   Acute on chronic diastolic CHF (congestive heart failure) (HCC) 09/21/2022   Peripheral neuropathy 09/21/2022   Hypothyroidism 09/21/2022   Acute bronchitis with bronchospasm 09/21/2022   Acute respiratory failure with hypoxia (HCC) 09/20/2022   Syncope 01/20/2021   Palpitations 08/07/2020   Leg edema 12/27/2018   Chronic fatigue 08/18/2017   Uncontrolled hypertension 07/11/2017   Shortness of breath 08/16/2010   Mitral valve prolapse    Mixed  hyperlipidemia    DIARRHEA 04/05/2010   Diverticulosis of colon 04/02/2010   CONSTIPATION, CHRONIC 04/02/2010   History of colonic polyps 04/02/2010    Past Surgical History:  Procedure Laterality Date   BELPHAROPTOSIS REPAIR     bladder tack     CATARACT EXTRACTION     COLONOSCOPY     DILATION AND CURETTAGE, DIAGNOSTIC / THERAPEUTIC     gluteous medius tendon rupture     done at [facility]   heel tendon repair     HIP ARTHROSCOPY W/ LABRAL REPAIR     kyploplasty     T12, L1, L2, L3, done at [facility]   PARTIAL HYSTERECTOMY      OB History   No obstetric history on file.      Home Medications    Prior to Admission medications   Medication Sig Start Date End Date Taking? Authorizing Provider  hydrOXYzine (ATARAX) 10 MG tablet Take 1 tablet (10 mg total) by mouth at bedtime as needed. 07/09/23  Yes Anakaren Campion A, FNP  Acetaminophen  Extra Strength 500 MG TABS Take 1,000 mg by mouth in the morning and at bedtime. 08/09/22   [provider]  albuterol  (VENTOLIN  HFA) 108 (90 Base) MCG/ACT inhaler Inhale 2 puffs into the lungs every 6 (six) hours  as needed for wheezing or shortness of breath. 09/23/22   Regalado, Belkys A, MD  amiodarone  (PACERONE ) 200 MG tablet Take 1/4 tablet (50 mg) by mouth once daily    [provider]  amLODipine  (NORVASC ) 5 MG tablet Take 1 tablet (5 mg total) by mouth at bedtime. 06/09/23 09/07/23  Furth, Cadence H, PA-C  Cholecalciferol  (VITAMIN D  PO) Take 1 tablet by mouth daily.    [provider]  clonazePAM  (KLONOPIN ) 0.5 MG tablet Take 0.25-0.5 mg by mouth at bedtime.    [provider]  cyanocobalamin  1000 MCG tablet Take 1 tablet (1,000 mcg total) by mouth daily. 09/24/22   Regalado, Belkys A, MD  diltiazem  (CARDIZEM  CD) 120 MG 24 hr capsule Take 1 capsule (120 mg total) by mouth daily. 02/08/23   Boyce Byes, MD  diltiazem  (CARDIZEM ) 30 MG tablet Take 1 tablet (30 mg total) by mouth 4 (four) times daily as needed (for  tachycardia and palpitations that do not improve with regular meds, fluids, and rest). 12/16/22   Tegeler, Marine Sia, MD  DULoxetine  (CYMBALTA ) 20 MG capsule Take 20 mg by mouth 2 (two) times daily. 05/13/20   [provider]  Fluticasone -Umeclidin-Vilant (TRELEGY ELLIPTA ) 100-62.5-25 MCG/ACT AEPB Inhale 1 puff into the lungs daily. 05/10/23   Quillian Brunt, MD  GEMTESA 75 MG TABS Take 1 tablet by mouth at bedtime.    [provider]  hydrALAZINE  (APRESOLINE ) 50 MG tablet Take 1 tablet (50 mg total) by mouth 2 (two) times daily. 06/09/23 09/07/23  Furth, Cadence H, PA-C  ipratropium-albuterol  (DUONEB) 0.5-2.5 (3) MG/3ML SOLN Take 3 mLs by nebulization 3 (three) times daily. 09/23/22   Regalado, Belkys A, MD  levothyroxine  (SYNTHROID , LEVOTHROID) 25 MCG tablet Take 62.5 mcg by mouth daily.    [provider]  mirtazapine (REMERON) 7.5 MG tablet Take 7.5 mg by mouth at bedtime.    [provider]  nitroGLYCERIN  (NITROSTAT ) 0.4 MG SL tablet Place 1 tablet (0.4 mg total) under the tongue every 5 (five) minutes as needed for chest pain. Maximum of 3 doses. 08/06/20   End, Veryl Gottron, MD  polyethylene glycol (MIRALAX / GLYCOLAX) 17 g packet Take 1 packet by mouth daily. 08/09/22   [provider]  potassium chloride  (KLOR-CON  M) 10 MEQ tablet TAKE 1 TABLET (10 MEQ TOTAL) DAILY AS NEEDED (WHEN YOU TAKE LASIX ) 06/28/23   Furth, Cadence H, PA-C    Family History Family History  Problem Relation Age of Onset   Heart disease Mother    Heart disease Other        aunt   Diabetes Other        grandmother   Breast cancer Other        aunt   Ovarian cancer Other        cousin   Rheum arthritis Father    Colon cancer Neg Hx     Social History Social History   Tobacco Use   Smoking status: Never   Smokeless tobacco: Never  Vaping Use   Vaping status: Never Used  Substance Use Topics   Alcohol use: No   Drug use: No     Allergies   Losartan,  Amlodipine , Cholestyramine, Cortisone, Prednisone , Tramadol, and Lisinopril   Review of Systems Review of Systems  See HPI Physical Exam Triage Vital Signs ED Triage Vitals  Encounter Vitals Group     BP 07/09/23 1037 (!) 155/69     Systolic BP Percentile --  Diastolic BP Percentile --      Pulse Rate 07/09/23 1037 61     Resp 07/09/23 1037 20     Temp 07/09/23 1037 98.7 F (37.1 C)     Temp src --      SpO2 07/09/23 1037 92 %     Weight --      Height --      Head Circumference --      Peak Flow --      Pain Score 07/09/23 1038 0     Pain Loc --      Pain Education --      Exclude from Growth Chart --    No data found.  Updated Vital Signs BP (!) 155/69 (BP Location: Left Arm)   Pulse 61   Temp 98.7 F (37.1 C)   Resp 20   SpO2 92%   Visual Acuity Right Eye Distance:   Left Eye Distance:   Bilateral Distance:    Right Eye Near:   Left Eye Near:    Bilateral Near:     Physical Exam Vitals and nursing note reviewed.  Constitutional:      General: She is not in acute distress.    Appearance: Normal appearance. She is not ill-appearing, toxic-appearing or diaphoretic.  Pulmonary:     Effort: Pulmonary effort is normal.  Skin:    General: Skin is warm and dry.     Findings: No rash.  Neurological:     Mental Status: She is alert.      UC Treatments / Results  Labs (all labs ordered are listed, but only abnormal results are displayed) Labs Reviewed - No data to display  EKG   Radiology No results found.  Procedures Procedures (including critical care time)  Medications Ordered in UC Medications - No data to display  Initial Impression / Assessment and Plan / UC Course  I have reviewed the triage vital signs and the nursing notes.  Pertinent labs & imaging results that were available during my care of the patient were reviewed by me and considered in my medical decision making (see chart for details).     Pruritus-no obvious rash  that could be causing the pruritus.  Most likely due to dry skin.  Recommended try Eucerin or CeraVe itch relief lotion.  She can do antihistamine like Zyrtec Also recommended baking soda bath Low-dose hydroxyzine to use at bedtime as needed Follow-up with your doctor for any continued issues Final Clinical Impressions(s) / UC Diagnoses   Final diagnoses:  Pruritus     Discharge Instructions      I believe that this condition may be due to dry skin I have given some information about things that can help this week. Keep the skin very moisturized.  Try Eucerin, Cerave itch relief lotion Can do antihistamine like zyrtec.    ED Prescriptions     Medication Sig Dispense Auth. Provider   hydrOXYzine (ATARAX) 10 MG tablet Take 1 tablet (10 mg total) by mouth at bedtime as needed. 15 tablet Landa Pine, FNP      PDMP not reviewed this encounter.   Landa Pine, FNP 07/09/23 1434

## 2023-07-11 ENCOUNTER — Encounter (HOSPITAL_BASED_OUTPATIENT_CLINIC_OR_DEPARTMENT_OTHER): Payer: Self-pay | Admitting: Emergency Medicine

## 2023-07-11 ENCOUNTER — Other Ambulatory Visit: Payer: Self-pay

## 2023-07-11 ENCOUNTER — Emergency Department (HOSPITAL_BASED_OUTPATIENT_CLINIC_OR_DEPARTMENT_OTHER)
Admission: EM | Admit: 2023-07-11 | Discharge: 2023-07-11 | Disposition: A | Discharge status: Home / Self Care | Attending: Emergency Medicine | Admitting: Emergency Medicine

## 2023-07-11 ENCOUNTER — Emergency Department (HOSPITAL_BASED_OUTPATIENT_CLINIC_OR_DEPARTMENT_OTHER)

## 2023-07-11 DIAGNOSIS — L299 Pruritus, unspecified: Secondary | ICD-10-CM | POA: Insufficient documentation

## 2023-07-11 DIAGNOSIS — F0390 Unspecified dementia without behavioral disturbance: Secondary | ICD-10-CM | POA: Diagnosis not present

## 2023-07-11 DIAGNOSIS — R350 Frequency of micturition: Secondary | ICD-10-CM | POA: Diagnosis not present

## 2023-07-11 DIAGNOSIS — E039 Hypothyroidism, unspecified: Secondary | ICD-10-CM | POA: Insufficient documentation

## 2023-07-11 LAB — CBC WITH DIFFERENTIAL/PLATELET
Abs Immature Granulocytes: 0.03 10*3/uL (ref 0.00–0.07)
Basophils Absolute: 0.1 10*3/uL (ref 0.0–0.1)
Basophils Relative: 1 %
Eosinophils Absolute: 0.2 10*3/uL (ref 0.0–0.5)
Eosinophils Relative: 3 %
HCT: 39.5 % (ref 36.0–46.0)
Hemoglobin: 13 g/dL (ref 12.0–15.0)
Immature Granulocytes: 1 %
Lymphocytes Relative: 18 %
Lymphs Abs: 1.1 10*3/uL (ref 0.7–4.0)
MCH: 28.2 pg (ref 26.0–34.0)
MCHC: 32.9 g/dL (ref 30.0–36.0)
MCV: 85.7 fL (ref 80.0–100.0)
Monocytes Absolute: 0.8 10*3/uL (ref 0.1–1.0)
Monocytes Relative: 12 %
Neutro Abs: 4.1 10*3/uL (ref 1.7–7.7)
Neutrophils Relative %: 65 %
Platelets: 333 10*3/uL (ref 150–400)
RBC: 4.61 MIL/uL (ref 3.87–5.11)
RDW: 15.2 % (ref 11.5–15.5)
WBC: 6.3 10*3/uL (ref 4.0–10.5)
nRBC: 0 % (ref 0.0–0.2)

## 2023-07-11 LAB — COMPREHENSIVE METABOLIC PANEL WITH GFR
ALT: 17 U/L (ref 0–44)
AST: 26 U/L (ref 15–41)
Albumin: 4.5 g/dL (ref 3.5–5.0)
Alkaline Phosphatase: 76 U/L (ref 38–126)
Anion gap: 14 (ref 5–15)
BUN: 24 mg/dL — ABNORMAL HIGH (ref 8–23)
CO2: 25 mmol/L (ref 22–32)
Calcium: 10.1 mg/dL (ref 8.9–10.3)
Chloride: 101 mmol/L (ref 98–111)
Creatinine, Ser: 0.82 mg/dL (ref 0.44–1.00)
GFR, Estimated: >60 mL/min (ref >60)
Glucose, Bld: 101 mg/dL — ABNORMAL HIGH (ref 70–99)
Potassium: 4 mmol/L (ref 3.5–5.1)
Sodium: 140 mmol/L (ref 135–145)
Total Bilirubin: 0.4 mg/dL (ref 0.0–1.2)
Total Protein: 7.4 g/dL (ref 6.5–8.1)

## 2023-07-11 LAB — URINALYSIS, W/ REFLEX TO CULTURE (INFECTION SUSPECTED)
Bilirubin Urine: NEGATIVE
Glucose, UA: NEGATIVE mg/dL
Hgb urine dipstick: NEGATIVE
Ketones, ur: NEGATIVE mg/dL
Leukocytes,Ua: NEGATIVE
Nitrite: NEGATIVE
Protein, ur: NEGATIVE mg/dL
Specific Gravity, Urine: <=1.005 (ref 1.005–1.030)
pH: 6 (ref 5.0–8.0)

## 2023-07-11 LAB — TROPONIN T, HIGH SENSITIVITY
Troponin T High Sensitivity: 23 ng/L — ABNORMAL HIGH (ref <19)
Troponin T High Sensitivity: 21 ng/L — ABNORMAL HIGH (ref <19)

## 2023-07-11 MED ORDER — HYDROXYZINE HCL 25 MG PO TABS: 25.0000 mg | ORAL_TABLET | Freq: Three times a day (TID) | ORAL | 0 refills | Status: DC | PRN

## 2023-07-11 MED ORDER — HYDROCORTISONE 1 % EX CREA: TOPICAL_CREAM | CUTANEOUS | 0 refills | Status: DC

## 2023-07-11 MED ORDER — FOSFOMYCIN TROMETHAMINE 3 G PO PACK
3.0000 g | PACK | Freq: Once | ORAL | Status: AC
  Administered 2023-07-11: 3 g via ORAL
  Filled 2023-07-11: qty 3

## 2023-07-11 NOTE — ED Triage Notes (Addendum)
 Per Son is  still having itching to face. No rash noted. Also frequent urination

## 2023-07-11 NOTE — ED Notes (Addendum)
 Pt alert and oriented X 4 at the time of discharge. RR even and unlabored. No acute distress noted. Pt verbalized understanding of discharge instructions as discussed. Pt transported in wheelchair to lobby at time of discharge.

## 2023-07-11 NOTE — ED Provider Notes (Signed)
 [facility] Provider Note   CSN: [id number] Arrival date & time: 07/11/23  0759     History  Chief Complaint  Patient presents with   Weakness    Rebecca Stanley is a 89 y.o. female with history of of MGUS, dementia, paroxysmal A-fib not on anticoagulation, hypothyroidism, presented to ED with generalized weakness, shaking, fatigue.  Her son is present at the bedside.  He reports that the patient had a "shaking episode this morning where she looks she had the chills".  No loss of consciousness.  The patient herself has no acute complaints although she is a level 5 caveat due to dementia.  Her son reports that she has had more frequent urination than typical.  Separately her son reports concern that the patient has ongoing pruritus involving predominantly the face but the upper extremities occasionally for the past few months.  They have been seen several times at urgent care or the ED for this issue.  She has had a T. bili level checked earlier this month that was normal.  There her psychiatrist increased her mirtazapine dose from 7.5 to 15 mg nightly last week, and she had 1 good night of sleep, but subsequently has been awake all night due to itching.  This was a problem before, where she is not sleeping due to itching.  Very specifically the itching is worse at night, and tends to go away or not bother her in the daytime.  They were also prescribed hydroxyzine which had some minor relief of the itching but then the itching came back.  They tried Benadryl once in the past which seemed to help the itching and help the patient's sleep, but they are hesitant to continue Benadryl given concerns for worsening dementia.  HPI     Home Medications Prior to Admission medications   Medication Sig Start Date End Date Taking? Authorizing Provider  Acetaminophen  Extra Strength 500 MG TABS Take 1,000 mg by mouth in the morning and at bedtime. 08/09/22    [provider]  albuterol  (VENTOLIN  HFA) 108 (90 Base) MCG/ACT inhaler Inhale 2 puffs into the lungs every 6 (six) hours as needed for wheezing or shortness of breath. 09/23/22   Regalado, Belkys A, MD  amiodarone  (PACERONE ) 200 MG tablet Take 1/4 tablet (50 mg) by mouth once daily    [provider]  amLODipine  (NORVASC ) 5 MG tablet Take 1 tablet (5 mg total) by mouth at bedtime. 06/09/23 09/07/23  Furth, Cadence H, PA-C  Cholecalciferol  (VITAMIN D  PO) Take 1 tablet by mouth daily.    [provider]  clonazePAM  (KLONOPIN ) 0.5 MG tablet Take 0.25-0.5 mg by mouth at bedtime.    [provider]  cyanocobalamin  1000 MCG tablet Take 1 tablet (1,000 mcg total) by mouth daily. 09/24/22   Regalado, Belkys A, MD  diltiazem  (CARDIZEM  CD) 120 MG 24 hr capsule Take 1 capsule (120 mg total) by mouth daily. 02/08/23   Boyce Byes, MD  diltiazem  (CARDIZEM ) 30 MG tablet Take 1 tablet (30 mg total) by mouth 4 (four) times daily as needed (for tachycardia and palpitations that do not improve with regular meds, fluids, and rest). 12/16/22   Tegeler, Marine Sia, MD  DULoxetine  (CYMBALTA ) 20 MG capsule Take 20 mg by mouth 2 (two) times daily. 05/13/20   [provider]  Fluticasone -Umeclidin-Vilant (TRELEGY ELLIPTA ) 100-62.5-25 MCG/ACT AEPB Inhale 1 puff into the lungs daily. 05/10/23   Quillian Brunt, MD  Seferino Dade  75 MG TABS Take 1 tablet by mouth at bedtime.    [provider]  hydrALAZINE  (APRESOLINE ) 50 MG tablet Take 1 tablet (50 mg total) by mouth 2 (two) times daily. 06/09/23 09/07/23  Furth, Cadence H, PA-C  hydrocortisone  cream 1 % Apply to affected area 2 times daily (do not apply near eyes or lips) for 7 days 07/11/23   Arvilla Birmingham, MD  hydrOXYzine (ATARAX) 10 MG tablet Take 1 tablet (10 mg total) by mouth at bedtime as needed. 07/09/23   Landa Pine, FNP  hydrOXYzine (ATARAX) 25 MG tablet Take 1 tablet (25 mg total) by mouth every 8 (eight) hours  as needed for up to 12 doses. 07/11/23   Arvilla Birmingham, MD  ipratropium-albuterol  (DUONEB) 0.5-2.5 (3) MG/3ML SOLN Take 3 mLs by nebulization 3 (three) times daily. 09/23/22   Regalado, Belkys A, MD  levothyroxine  (SYNTHROID , LEVOTHROID) 25 MCG tablet Take 62.5 mcg by mouth daily.    [provider]  mirtazapine (REMERON) 7.5 MG tablet Take 7.5 mg by mouth at bedtime.    [provider]  nitroGLYCERIN  (NITROSTAT ) 0.4 MG SL tablet Place 1 tablet (0.4 mg total) under the tongue every 5 (five) minutes as needed for chest pain. Maximum of 3 doses. 08/06/20   End, Veryl Gottron, MD  polyethylene glycol (MIRALAX / GLYCOLAX) 17 g packet Take 1 packet by mouth daily. 08/09/22   [provider]  potassium chloride  (KLOR-CON  M) 10 MEQ tablet TAKE 1 TABLET (10 MEQ TOTAL) DAILY AS NEEDED (WHEN YOU TAKE LASIX ) 06/28/23   Furth, Cadence H, PA-C      Allergies    Losartan, Amlodipine , Cholestyramine, Cortisone, Prednisone , Tramadol, and Lisinopril    Review of Systems   Review of Systems  Physical Exam Updated Vital Signs BP (!) 151/52   Pulse 77   Temp 98.2 F (36.8 C) (Oral)   Resp 16   SpO2 93%  Physical Exam Constitutional:      General: She is not in acute distress. HENT:     Head: Normocephalic and atraumatic.  Eyes:     Conjunctiva/sclera: Conjunctivae normal.     Pupils: Pupils are equal, round, and reactive to light.  Cardiovascular:     Rate and Rhythm: Normal rate and regular rhythm.  Pulmonary:     Effort: Pulmonary effort is normal. No respiratory distress.  Abdominal:     General: There is no distension.     Tenderness: There is no abdominal tenderness.  Skin:    General: Skin is warm and dry.  Neurological:     General: No focal deficit present.     Mental Status: She is alert. Mental status is at baseline.  Psychiatric:        Mood and Affect: Mood normal.        Behavior: Behavior normal.     ED Results / Procedures / Treatments   Labs (all  labs ordered are listed, but only abnormal results are displayed) Labs Reviewed  URINALYSIS, W/ REFLEX TO CULTURE (INFECTION SUSPECTED) - Abnormal; Notable for the following components:      Result Value   Bacteria, UA RARE (*)    All other components within normal limits  COMPREHENSIVE METABOLIC PANEL WITH GFR - Abnormal; Notable for the following components:   Glucose, Bld 101 (*)    BUN 24 (*)    All other components within normal limits  TROPONIN T, HIGH SENSITIVITY - Abnormal; Notable for the following components:   Troponin T  High Sensitivity 23 (*)    All other components within normal limits  TROPONIN T, HIGH SENSITIVITY - Abnormal; Notable for the following components:   Troponin T High Sensitivity 21 (*)    All other components within normal limits  URINE CULTURE  CBC WITH DIFFERENTIAL/PLATELET    EKG EKG Interpretation Date/Time:  Tuesday July 11 2023 08:24:38 EDT Ventricular Rate:  61 PR Interval:  164 QRS Duration:  112 QT Interval:  474 QTC Calculation: 478 R Axis:   92  Text Interpretation: Sinus rhythm IRBBB no sig change from prior tracing Confirmed by Jerald Molly [phone]) on 07/11/2023 8:56:06 AM  Radiology DG Chest 2 View Result Date: 07/11/2023 CLINICAL DATA:  Chills.  Pneumonia evaluation. EXAM: CHEST - 2 VIEW COMPARISON:  Chest radiograph dated 03/28/2023. FINDINGS: Minimal left lung base atelectasis. No focal consolidation, pleural effusion or pneumothorax. Stable cardiac silhouette. Atherosclerotic calcification of the aorta. No acute osseous pathology. IMPRESSION: No active cardiopulmonary disease. Electronically Signed   By: Angus Bark M.D.   On: 07/11/2023 10:25    Procedures Procedures    Medications Ordered in ED Medications  fosfomycin (MONUROL) packet 3 g (3 g Oral Given 07/11/23 1323)    ED Course/ Medical Decision Making/ A&P                                 Medical Decision Making Amount and/or Complexity of Data  Reviewed Labs: ordered. Radiology: ordered.  Risk Prescription drug management.   This patient presents to the ED with concern for urinary frequency, pruritus, chills. This involves an extensive number of treatment options, and is a complaint that carries with it a high risk of complications and morbidity.  The differential diagnosis includes infection including UTI versus metabolic derangement versus atypical ACS versus other  Her pruritus may be related to MGUS proteins vs contact exposure (eg. Cat dander in the house, or makeup on face) vs other process  To me, it is quite atypical that this bothers her only at night, and involves predominantly just the face, not the rest of the body.  I do not see any rash or hives to suggest that this is an allergic reaction.  Could consider LOW potency steroid cream for face and moisturizer  Co-morbidities that complicate the patient evaluation: Dementia, MGUS  Additional history obtained from patient's son at [facility]  External records from outside source obtained and reviewed including prior ed evaluations this month  I ordered and personally interpreted labs.  The pertinent results include: No emergent findings.  Very minor elevation of troponin but flat on repeat.  UA with trace bacteria, negative leukocytes and nitrites.  Patient's son reports that the symptoms have been consistent with UTI in the past, particularly was reported "shaking or chills episode".  Therefore I think there is reasonable evidence to treat for UTI empirically, we will add on a urine culture.  It we will try a single dose of fosfomycin.  My suspicion for sepsis or bacteremia is quite low  I ordered imaging studies including dg chest I independently visualized and interpreted imaging which showed no findings I agree with the radiologist interpretation  The patient was maintained on a cardiac monitor.  I personally viewed and interpreted the cardiac monitored which showed an  underlying rhythm of: Sinus rhythm  Per my interpretation the patient's ECG shows no acute ischemic findings  I ordered medication including fosfomycin for UTI, uncomplicated  I  have reviewed the patients home medicines and have made adjustments as needed  Test Considered: Doubt sepsis, PE, meningitis  After the interventions noted above, I reevaluated the patient and found that they have: stayed the same  We discussed a trial of 7 days of low potency steroid hydrocortisone  lotion on the face, sparing the eyes and lips, which may help a bit with the itching and increasing atarax dosing to 25 mg esp at night.  I do not want to put her on regular Benadryl as this appears to cause confusion issues for her.  Dispostion:  After consideration of the diagnostic results and the patients response to treatment, I feel that the patent would benefit from close outpatient follow-up         Final Clinical Impression(s) / ED Diagnoses Final diagnoses:  Pruritus  Urinary frequency    Rx / DC Orders ED Discharge Orders          Ordered    hydrocortisone  cream 1 %  Status:  Discontinued        07/11/23 1315    hydrOXYzine (ATARAX) 25 MG tablet  Every 8 hours PRN,   Status:  Discontinued        07/11/23 1315    hydrocortisone  cream 1 %        07/11/23 1324    hydrOXYzine (ATARAX) 25 MG tablet  Every 8 hours PRN        07/11/23 1324              Arvilla Birmingham, MD 07/11/23 1415

## 2023-07-11 NOTE — Discharge Instructions (Signed)
 Call your [facility] team about your ongoing itching which may be related to your underlying medical issues including MGUS.

## 2023-07-12 LAB — URINE CULTURE

## 2023-09-11 ENCOUNTER — Ambulatory Visit: Admitting: Medical

## 2023-09-27 ENCOUNTER — Ambulatory Visit (HOSPITAL_BASED_OUTPATIENT_CLINIC_OR_DEPARTMENT_OTHER): Payer: Self-pay

## 2023-09-29 ENCOUNTER — Encounter: Payer: Self-pay | Admitting: Medical

## 2023-09-29 ENCOUNTER — Encounter: Payer: Self-pay | Admitting: Advanced Practice Midwife

## 2023-09-29 ENCOUNTER — Ambulatory Visit: Attending: Medical | Admitting: Medical

## 2023-09-29 VITALS — BP 130/60 | HR 70 | Ht 61.0 in | Wt 155.6 lb

## 2023-09-29 DIAGNOSIS — I5032 Chronic diastolic (congestive) heart failure: Secondary | ICD-10-CM | POA: Diagnosis present

## 2023-09-29 DIAGNOSIS — R600 Localized edema: Secondary | ICD-10-CM | POA: Insufficient documentation

## 2023-09-29 DIAGNOSIS — I10 Essential (primary) hypertension: Secondary | ICD-10-CM | POA: Insufficient documentation

## 2023-09-29 DIAGNOSIS — I471 Supraventricular tachycardia, unspecified: Secondary | ICD-10-CM | POA: Diagnosis present

## 2023-09-29 MED ORDER — FUROSEMIDE 20 MG PO TABS: 20.0000 mg | ORAL_TABLET | Freq: Every day | ORAL | 3 refills | Status: DC | PRN

## 2023-09-29 MED ORDER — AMIODARONE HCL 100 MG PO TABS: 100.0000 mg | ORAL_TABLET | Freq: Every day | ORAL | 3 refills | Status: DC

## 2023-09-29 MED ORDER — HYDRALAZINE HCL 25 MG PO TABS: 25.0000 mg | ORAL_TABLET | Freq: Every evening | ORAL | 3 refills | Status: DC

## 2023-09-29 NOTE — Progress Notes (Signed)
 Cardiology Office Note   Date:  09/29/2023  ID:  Rebecca Stanley, DOB 1930-08-31, MRN [id number] PCP: Thurmond Cathlyn LABOR., MD  [facility] Providers Cardiologist:  Lonni Hanson, MD Electrophysiologist:  OLE ONEIDA HOLTS, MD   History of Present Illness Rebecca Stanley is a 89 y.o. female  with a hx of HFpEF, pSVT, mitral valve prolapse, HTN, HLD, chronic back pain who presents for follow-up for HTN and SVT.     She previously underwent diagnostic catheterization in March 2018 at [facility], showing normal coronaries with an EF of 65%.  She subsequently underwent stress testing in September 2020 showing EF 55 to 65% without ischemia or infarct.  She was recently evaluated by Dr. Hanson in May of this year following hospitalization at [facility] for tachypalpitations and documented tachycardia.  Records have not been available for review.  Patient wore a Zio monitor in June of this year which did not show any significant arrhythmias.  Triggered events were associated with sinus rhythm.  Echocardiogram earlier this month showed an EF of 55 to 60%, grade 1 diastolic dysfunction, and an elevated RVSP at 48.2 mmHg.  In that setting, she was prescribed Lasix  20 mg daily as well as potassium chloride  10 mEq daily.   She was seen June 2023 reporting weakness and fatigue.  Patient was off Lasix  due to low sodium levels.  She is on hydrochlorothiazide .  Patient had lower leg edema on exam, which seem to be a chronic issue.  Echo showed normal LVEF 60 to 65%, grade 1 diastolic dysfunction, trivial MR.   Patient was seen in April 2024 for preop evaluation for right knee replacement.  She had been seen in the [facility] ER for elevated heart rate.  Workup in the ER was overall normal.  She was started on metoprolol .  A 2-week heart monitor was ordered.  Heart monitor showed predominantly normal sinus rhythm, rare PACs and PVCs and several runs of SVT lasting up to 32 seconds.  Triggered events  responded with normal sinus rhythm.   Patient was admitted July 2024 with acute respiratory failure with hypoxia due to acute bronchitis, hypokalemia, acute on chronic diastolic heart failure and elevated blood pressure.  Echocardiogram showed LVEF 65 to 70%, moderate LVH, grade 1 diastolic dysfunction, trivial MR. She was given IV lasix , duonebs and steroids. She was sent home with lasix  20mg  PRN.   Patient was seen in the ER 10/4 for tachycardia. EKG showed atrial tachycardia vs aflutter. Cardiology was consulted who recommended a dilt bolus. The patient was given IVF and IV dilt and converted to NSR. She was discharged home.  Patient was seen in follow-up and referred to EP.  She saw EP who felt it was SVT, they recommended continuation of rate control.she was subsequently started on amiodarone  for better rate control.  Patient was seen by Dr. HOLTS 06/21/23.  Patient had reduced dose of amio to 50 mg once daily.  Amiodarone  and diltiazem  were continued.  Today, the patient reports she is better after right shoulder surgery. She is still doing PT. She denies chest pain,SOB, lower leg edema. Says BP is better since surgery. She is taking 100mg  amiodarone  and cardizem  120mg  daily. She changed to Hydralazine  25mg  daily at night and amlodipine  5mg  daily at night.    Studies Reviewed      Heart monitor 01/2023   The patient was monitored for 14 days.   The predominant rhythm was sinus with an average rate of 71  bpm (range 48-121 bpm in sinus).   There were rare PAC's and PVC's.   4 supraventricular runs were observed, lasting up to 9 beats with a maximum rate of 179 bpm.   No sustained arrhythmia or prolonged pause occurred.   Patient triggered events correspond to sinus rhythm, PAC's, and artifact.   Predominantly sinus rhythm with rare PAC's and PVC's as well as a few brief runs of PSVT.  No sustained arrhythmia was observed.   Echo 09/2022  1. Left ventricular ejection fraction, by  estimation, is 65 to 70%. The  left ventricle has normal function. The left ventricle has no regional  wall motion abnormalities. There is moderate concentric left ventricular  hypertrophy. Left ventricular  diastolic parameters are consistent with Grade I diastolic dysfunction  (impaired relaxation).   2. Right ventricular systolic function is normal. The right ventricular  size is normal. There is normal pulmonary artery systolic pressure.   3. Left atrial size was mildly dilated.   4. The mitral valve is normal in structure. Trivial mitral valve  regurgitation. No evidence of mitral stenosis.   5. The aortic valve is normal in structure. Aortic valve regurgitation is  trivial. Aortic valve sclerosis/calcification is present, without any  evidence of aortic stenosis. Aortic valve area, by VTI measures 2.51 cm.  Aortic valve mean gradient measures   7.0 mmHg. Aortic valve Vmax measures 1.76 m/s.   6. The inferior vena cava is normal in size with greater than 50%  respiratory variability, suggesting right atrial pressure of 3 mmHg.    Heart monitor 07/2022    The patient was monitored for 13 days, 20 hours.   The predominant rhythm was sinus with an average rate of 79 bpm (range 49-123 bpm in sinus).   There were rare PAC's and PVC's.   7 supraventricular runs were observed, lasting up to 31.6 seconds with a maximum rate of 174 bpm.   No prolonged pause occurred.   Patient triggered events correspond to normal sinus rhythm.   Predominantly sinus rhythm with rare PAC's and PVC's.  Several runs of PSVT noted, lasting up to 32 seconds.      Physical Exam VS:  BP 130/60 (BP Location: Left Arm, Patient Position: Sitting, Cuff Size: Normal)   Pulse 70   Ht 5' 1 (1.549 m)   Wt 155 lb 9.6 oz (70.6 kg)   SpO2 92%   BMI 29.40 kg/m        Wt Readings from Last 3 Encounters:  09/29/23 155 lb 9.6 oz (70.6 kg)  07/05/23 155 lb 11.2 oz (70.6 kg)  06/25/23 154 lb 5.2 oz (70 kg)    GEN:  Well nourished, well developed in no acute distress NECK: No JVD; No carotid bruits CARDIAC: RRR, no murmurs, rubs, gallops RESPIRATORY:  Clear to auscultation without rales, wheezing or rhonchi  ABDOMEN: Soft, non-tender, non-distended EXTREMITIES:  trace lower leg edema; No deformity   ASSESSMENT AND PLAN  HTN BP is much better after shoulder surgery. She is taking amlodipine  5mg  daily, Hydralazine  25mg  daily, and Cardizem  120mg  daily.   SVT She denies palpitations. Continue amiodarone  100mg  daiy and Diltazem 120mg  daily. She also has short acting dilt to use as needed. She follows with EP.   Chronic lower extremity edema HFpEF This overall appears stable. I will send in lasix  20mg  to use as needed.        Dispo: follow-up in 6 months  Signed, Jermery Caratachea VEAR Fishman, PA-C

## 2023-09-29 NOTE — Patient Instructions (Signed)
 Medication Instructions:  Your physician recommends the following medication changes.  START TAKING: Lasix  20 mg by mouth daily as needed    *If you need a refill on your cardiac medications before your next appointment, please call your pharmacy*  Lab Work: No labs ordered today    Testing/Procedures: No test ordered today   Follow-Up: At [facility], you and your health needs are our priority.  As part of our continuing mission to provide you with exceptional heart care, our providers are all part of one team.  This team includes your primary Cardiologist (physician) and Advanced Practice Providers or APPs (Physician Assistants and Nurse Practitioners) who all work together to provide you with the care you need, when you need it.  Your next appointment:   6 month(s)  Provider:   Cadence Franchester, PA-C    Your physician recommends that you schedule a follow-up appointment in 3 months with EP

## 2023-10-06 ENCOUNTER — Encounter (HOSPITAL_BASED_OUTPATIENT_CLINIC_OR_DEPARTMENT_OTHER): Payer: Self-pay | Admitting: Emergency Medicine

## 2023-10-06 ENCOUNTER — Ambulatory Visit (HOSPITAL_BASED_OUTPATIENT_CLINIC_OR_DEPARTMENT_OTHER): Payer: Self-pay

## 2023-10-06 ENCOUNTER — Ambulatory Visit (HOSPITAL_BASED_OUTPATIENT_CLINIC_OR_DEPARTMENT_OTHER)
Admission: EM | Admit: 2023-10-06 | Discharge: 2023-10-06 | Disposition: A | Discharge status: Home / Self Care | Attending: Family Medicine | Admitting: Family Medicine

## 2023-10-06 DIAGNOSIS — R0981 Nasal congestion: Secondary | ICD-10-CM | POA: Diagnosis not present

## 2023-10-06 DIAGNOSIS — H6121 Impacted cerumen, right ear: Secondary | ICD-10-CM

## 2023-10-06 MED ORDER — FLUTICASONE PROPIONATE 50 MCG/ACT NA SUSP: 1.0000 | Freq: Two times a day (BID) | NASAL | 0 refills | Status: DC | PRN

## 2023-10-06 NOTE — Discharge Instructions (Addendum)
 Cerumen impaction right ear: Ear lavage done.  Moderate amount of wax obtained.  Follow-up as needed.  This does improve the functionality of her hearing aid.  Nasal congestion: Stop the Afrin nasal spray.  Use fluticasone  nasal spray, 1 spray into each nostril twice daily for nasal congestion.  Use saline brace into the nostril hourly if needed.  Follow-up if symptoms do not improve, worsen or new symptoms occur.

## 2023-10-06 NOTE — ED Provider Notes (Signed)
 Rebecca Stanley    CSN: [id number] Arrival date & time: 10/06/23  1629      History   Chief Complaint Chief Complaint  Patient presents with   Nasal Congestion    HPI Rebecca Stanley is a 89 y.o. female.   Patient here with her son.  She reports nasal congestion since 09/21/2023 or earlier.  She has had Afrin at home and has used that quite a bit.  She was seen by Darice Sobers at primary [facility] here in [place].  She was given azelastine nasal spray but felt like it did not work at all.  She was also given a Z-Pak and feels that the antibiotic did not do anything for her either.  She feels like her nose is very congested and she cannot breathe through her nose at all.  Her son thinks she has used an entire bottle of Afrin since Monday, 10/01/2023.  She continues to feel like her nose is just completely closed up and she cannot breathe through her nose and is having to breathe through her mouth only.     Past Medical History:  Diagnosis Date   (HFpEF) heart failure with preserved ejection fraction (HCC)    a. 09/2020 Echo: EF 55-60%, no rwma, Gr1DD, nl RV size/fxn. RVSP 48.14mmHg. Mild LAE. Mild MR.   Allergy    Arthritis    Blood transfusion    Cancer [facility])    Colitis    Colon polyp    Depression    Diverticulosis    Heart murmur    History of cardiac catheterization    a. 05/2016 Cath ([place]): Nl cors. EF 65%. LVEDP .   History of stress test    a. 11/2018 MV: EF 55-65%, no ischemia/infarct.   Hyperlipidemia    Hypertension    Mitral valve prolapse    a. 09/2020 Echo: Mild MR.   Osteoporosis    Palpitations    a. 07/2020 reported tachycardia-->08/2020 Z[facility]: RSR 78 (49-143). Rare PACs/PVCs. 3 atrial runs up to 10 beats, max 162 bpm. Triggered events = sinus rhythm.   Thyroid  disease     Patient Active Problem List   Diagnosis Date Noted   Supraventricular tachycardia (HCC) 04/08/2023   Acute on chronic diastolic CHF (congestive heart failure) (HCC) 09/21/2022    Peripheral neuropathy 09/21/2022   Hypothyroidism 09/21/2022   Acute bronchitis with bronchospasm 09/21/2022   Acute respiratory failure with hypoxia (HCC) 09/20/2022   Syncope 01/20/2021   Palpitations 08/07/2020   Leg edema 12/27/2018   Chronic fatigue 08/18/2017   Uncontrolled hypertension 07/11/2017   Shortness of breath 08/16/2010   Mitral valve prolapse    Mixed hyperlipidemia    DIARRHEA 04/05/2010   Diverticulosis of colon 04/02/2010   CONSTIPATION, CHRONIC 04/02/2010   History of colonic polyps 04/02/2010    Past Surgical History:  Procedure Laterality Date   BELPHAROPTOSIS REPAIR     bladder tack     CATARACT EXTRACTION     COLONOSCOPY     DILATION AND CURETTAGE, DIAGNOSTIC / THERAPEUTIC     gluteous medius tendon rupture     done at [facility]   heel tendon repair     HIP ARTHROSCOPY W/ LABRAL REPAIR     kyploplasty     T12, L1, L2, L3, done at [facility]   PARTIAL HYSTERECTOMY      OB History   No obstetric history on file.      Home Medications    Prior to Admission medications  Medication Sig Start Date End Date Taking? Authorizing Provider  fluticasone  (FLONASE ) 50 MCG/ACT nasal spray Place 1 spray into both nostrils 2 (two) times daily as needed for rhinitis. 10/06/23 11/05/23 Yes Ival Domino, FNP  Acetaminophen  Extra Strength 500 MG TABS Take 1,000 mg by mouth in the morning and at bedtime. 08/09/22   [provider]  albuterol  (VENTOLIN  HFA) 108 (90 Base) MCG/ACT inhaler Inhale 2 puffs into the lungs every 6 (six) hours as needed for wheezing or shortness of breath. 09/23/22   Regalado, Belkys A, MD  amiodarone  (PACERONE ) 100 MG tablet Take 1 tablet (100 mg total) by mouth daily. 09/29/23   Furth, Cadence H, PA-C  amLODipine  (NORVASC ) 5 MG tablet Take 1 tablet (5 mg total) by mouth at bedtime. 06/09/23 09/29/23  Furth, Cadence H, PA-C  clonazePAM  (KLONOPIN ) 0.5 MG tablet Take 0.25-0.5 mg by mouth at bedtime.    [provider]  cyanocobalamin   1000 MCG tablet Take 1 tablet (1,000 mcg total) by mouth daily. 09/24/22   Regalado, Belkys A, MD  diltiazem  (CARDIZEM  CD) 120 MG 24 hr capsule Take 1 capsule (120 mg total) by mouth daily. 02/08/23   Cindie Ole DASEN, MD  diltiazem  (CARDIZEM ) 30 MG tablet Take 1 tablet (30 mg total) by mouth 4 (four) times daily as needed (for tachycardia and palpitations that do not improve with regular meds, fluids, and rest). 12/16/22   Tegeler, Lonni PARAS, MD  furosemide  (LASIX ) 20 MG tablet Take 1 tablet (20 mg total) by mouth daily as needed. 09/29/23 12/28/23  Furth, Cadence H, PA-C  GEMTESA 75 MG TABS Take 1 tablet by mouth at bedtime.    [provider]  hydrALAZINE  (APRESOLINE ) 25 MG tablet Take 1 tablet (25 mg total) by mouth at bedtime. 09/29/23   Furth, Cadence H, PA-C  hydrocortisone  cream 1 % Apply to affected area 2 times daily (do not apply near eyes or lips) for 7 days 07/11/23   Cottie Donnice PARAS, MD  ipratropium-albuterol  (DUONEB) 0.5-2.5 (3) MG/3ML SOLN Take 3 mLs by nebulization 3 (three) times daily. Patient taking differently: Take 3 mLs by nebulization as needed. 09/23/22   Regalado, Belkys A, MD  levothyroxine  (SYNTHROID , LEVOTHROID) 25 MCG tablet Take 62.5 mcg by mouth daily.    [provider]  mirtazapine (REMERON) 7.5 MG tablet Take 7.5 mg by mouth at bedtime. Patient taking differently: Take 15 mg by mouth at bedtime.    [provider]  polyethylene glycol (MIRALAX / GLYCOLAX) 17 g packet Take 1 packet by mouth daily. 08/09/22   [provider]  potassium chloride  (KLOR-CON  M) 10 MEQ tablet TAKE 1 TABLET (10 MEQ TOTAL) DAILY AS NEEDED (WHEN YOU TAKE LASIX ) Patient taking differently: 10 mEq as needed. 06/28/23   Franchester, Cadence H, PA-C    Family History Family History  Problem Relation Age of Onset   Heart disease Mother    Heart disease Other        aunt   Diabetes Other        grandmother   Breast cancer Other        aunt   Ovarian cancer  Other        cousin   Rheum arthritis Father    Colon cancer Neg Hx     Social History Social History   Tobacco Use   Smoking status: Never   Smokeless tobacco: Never  Vaping Use   Vaping status: Never Used  Substance Use Topics   Alcohol use: No  Drug use: No     Allergies   Losartan, Amlodipine , Cholestyramine, Cortisone, Prednisone , Tramadol, and Lisinopril   Review of Systems Review of Systems  Constitutional:  Negative for chills and fever.  HENT:  Positive for congestion, postnasal drip, rhinorrhea and sinus pressure. Negative for ear pain and sore throat.   Eyes:  Negative for pain and visual disturbance.  Respiratory:  Negative for cough and shortness of breath.   Cardiovascular:  Negative for chest pain and palpitations.  Gastrointestinal:  Negative for abdominal pain, constipation, diarrhea, nausea and vomiting.  Genitourinary:  Negative for dysuria and hematuria.  Musculoskeletal:  Negative for arthralgias and back pain.  Skin:  Negative for color change and rash.  Neurological:  Negative for seizures and syncope.  All other systems reviewed and are negative.    Physical Exam Triage Vital Signs ED Triage Vitals  Encounter Vitals Group     BP 10/06/23 1728 (!) 182/76     Girls Systolic BP Percentile --      Girls Diastolic BP Percentile --      Boys Systolic BP Percentile --      Boys Diastolic BP Percentile --      Pulse Rate 10/06/23 1728 69     Resp 10/06/23 1728 20     Temp 10/06/23 1728 98.2 F (36.8 C)     Temp Source 10/06/23 1728 Oral     SpO2 10/06/23 1728 95 %     Weight --      Height --      Head Circumference --      Peak Flow --      Pain Score 10/06/23 1729 0     Pain Loc --      Pain Education --      Exclude from Growth Chart --    No data found.  Updated Vital Signs BP (!) 182/76 (BP Location: Left Arm)   Pulse 69   Temp 98.2 F (36.8 C) (Oral)   Resp 20   SpO2 95%   Visual Acuity Right Eye Distance:   Left Eye  Distance:   Bilateral Distance:    Right Eye Near:   Left Eye Near:    Bilateral Near:     Physical Exam Vitals and nursing note reviewed.  Constitutional:      General: She is not in acute distress.    Appearance: She is well-developed. She is not ill-appearing, toxic-appearing or diaphoretic.  HENT:     Head: Normocephalic and atraumatic.     Right Ear: Tympanic membrane and external ear normal. Decreased hearing (Uses a hearing aid) noted. There is impacted cerumen.     Left Ear: Tympanic membrane, ear canal and external ear normal. Decreased hearing (Uses a hearing aid) noted.     Ears:     Comments: Reassessment after ear lavage: Ear is completely clear wax has been removed hearing has improved.    Nose: Mucosal edema, congestion and rhinorrhea present. Rhinorrhea is clear.     Right Sinus: No maxillary sinus tenderness or frontal sinus tenderness.     Left Sinus: No maxillary sinus tenderness or frontal sinus tenderness.     Mouth/Throat:     Lips: Pink.     Mouth: Mucous membranes are moist.     Pharynx: Uvula midline. No oropharyngeal exudate or posterior oropharyngeal erythema.     Tonsils: No tonsillar exudate.  Eyes:     Conjunctiva/sclera: Conjunctivae normal.     Pupils: Pupils are equal, round,  and reactive to light.  Cardiovascular:     Rate and Rhythm: Normal rate and regular rhythm.     Heart sounds: S1 normal and S2 normal. No murmur heard. Pulmonary:     Effort: Pulmonary effort is normal. No respiratory distress.     Breath sounds: Normal breath sounds. No decreased breath sounds, wheezing, rhonchi or rales.  Abdominal:     General: Bowel sounds are normal.     Palpations: Abdomen is soft.     Tenderness: There is no abdominal tenderness.  Musculoskeletal:        General: No swelling.     Cervical back: Neck supple.  Lymphadenopathy:     Head:     Right side of head: No submental, submandibular, tonsillar, preauricular or posterior auricular adenopathy.      Left side of head: No submental, submandibular, tonsillar, preauricular or posterior auricular adenopathy.     Cervical: No cervical adenopathy.     Right cervical: No superficial cervical adenopathy.    Left cervical: No superficial cervical adenopathy.  Skin:    General: Skin is warm and dry.     Capillary Refill: Capillary refill takes less than 2 seconds.     Findings: No rash.  Neurological:     Mental Status: She is alert and oriented to person, place, and time.  Psychiatric:        Mood and Affect: Mood normal.      UC Treatments / Results  Labs (all labs ordered are listed, but only abnormal results are displayed) Labs Reviewed - No data to display  EKG   Radiology No results found.  Procedures Procedures (including critical Stanley time)  Medications Ordered in UC Medications - No data to display  Initial Impression / Assessment and Plan / UC Course  I have reviewed the triage vital signs and the nursing notes.  Pertinent labs & imaging results that were available during my Stanley of the patient were reviewed by me and considered in my medical decision making (see chart for details).  Plan of Stanley: Right ear cerumen impaction: Ear was completely cleared with ear lavage.  Hearing was improved.  Nasal congestion: Stop the Afrin.  Use fluticasone  nasal spray, 1 spray into each nostril twice daily for nasal congestion.  Patient's allergy to prednisone  and cortisone is that it raises her blood pressure but a nasal steroid should not do that and certainly will not raise her blood pressure like Afrin has.  Use nasal saline spray in each nostril hourly, or as needed for dry nose.  Follow-up if symptoms do not improve, worsen or new symptoms occur.  I reviewed the plan of Stanley with the patient and/or the patient's guardian.  The patient and/or guardian had time to ask questions and acknowledged that the questions were answered.  I provided instruction on symptoms or  reasons to return here or to go to an ER, if symptoms/condition did not improve, worsened or if new symptoms occurred.  Final Clinical Impressions(s) / UC Diagnoses   Final diagnoses:  Impacted cerumen of right ear  Nasal congestion     Discharge Instructions      Cerumen impaction right ear: Ear lavage done.  Moderate amount of wax obtained.  Follow-up as needed.  This does improve the functionality of her hearing aid.  Nasal congestion: Stop the Afrin nasal spray.  Use fluticasone  nasal spray, 1 spray into each nostril twice daily for nasal congestion.  Use saline brace into the nostril hourly if  needed.  Follow-up if symptoms do not improve, worsen or new symptoms occur.     ED Prescriptions     Medication Sig Dispense Auth. Provider   fluticasone  (FLONASE ) 50 MCG/ACT nasal spray Place 1 spray into both nostrils 2 (two) times daily as needed for rhinitis. 17 mL Ival Domino, FNP      PDMP not reviewed this encounter.   Ival Domino, FNP 10/06/23 [phone]

## 2023-10-06 NOTE — ED Triage Notes (Signed)
 Son st's pt has had nasal congestion  x's 15 days ago   Was seen by her MD and was given a Z Pac  St's she finished it on Monday  Pt continues to c/o both nostrils being stopped up  Pt has been using Afrin OTC without relief

## 2023-10-24 DIAGNOSIS — L814 Other melanin hyperpigmentation: Secondary | ICD-10-CM | POA: Insufficient documentation

## 2023-10-25 ENCOUNTER — Emergency Department (HOSPITAL_BASED_OUTPATIENT_CLINIC_OR_DEPARTMENT_OTHER): Admission: EM | Admit: 2023-10-25 | Discharge: 2023-10-25 | Disposition: A | Discharge status: Home / Self Care

## 2023-10-25 ENCOUNTER — Encounter (HOSPITAL_BASED_OUTPATIENT_CLINIC_OR_DEPARTMENT_OTHER): Payer: Self-pay | Admitting: Emergency Medicine

## 2023-10-25 ENCOUNTER — Other Ambulatory Visit: Payer: Self-pay

## 2023-10-25 DIAGNOSIS — R531 Weakness: Secondary | ICD-10-CM | POA: Diagnosis not present

## 2023-10-25 DIAGNOSIS — R519 Headache, unspecified: Secondary | ICD-10-CM | POA: Insufficient documentation

## 2023-10-25 DIAGNOSIS — Z79899 Other long term (current) drug therapy: Secondary | ICD-10-CM | POA: Insufficient documentation

## 2023-10-25 DIAGNOSIS — R208 Other disturbances of skin sensation: Secondary | ICD-10-CM | POA: Insufficient documentation

## 2023-10-25 LAB — URINALYSIS, MICROSCOPIC (REFLEX): WBC, UA: NONE SEEN WBC/hpf (ref 0–5)

## 2023-10-25 LAB — URINALYSIS, ROUTINE W REFLEX MICROSCOPIC
Bilirubin Urine: NEGATIVE
Glucose, UA: NEGATIVE mg/dL
Ketones, ur: NEGATIVE mg/dL
Leukocytes,Ua: NEGATIVE
Nitrite: NEGATIVE
Protein, ur: NEGATIVE mg/dL
Specific Gravity, Urine: <=1.005 (ref 1.005–1.030)
pH: 6 (ref 5.0–8.0)

## 2023-10-25 LAB — RESP PANEL BY RT-PCR (RSV, FLU A&B, COVID)  RVPGX2
Influenza A by PCR: NEGATIVE
Influenza B by PCR: NEGATIVE
Resp Syncytial Virus by PCR: NEGATIVE
SARS Coronavirus 2 by RT PCR: NEGATIVE

## 2023-10-25 LAB — COMPREHENSIVE METABOLIC PANEL WITH GFR
ALT: 14 U/L (ref 0–44)
AST: 24 U/L (ref 15–41)
Albumin: 4.2 g/dL (ref 3.5–5.0)
Alkaline Phosphatase: 87 U/L (ref 38–126)
Anion gap: 13 (ref 5–15)
BUN: 23 mg/dL (ref 8–23)
CO2: 25 mmol/L (ref 22–32)
Calcium: 9.5 mg/dL (ref 8.9–10.3)
Chloride: 99 mmol/L (ref 98–111)
Creatinine, Ser: 0.91 mg/dL (ref 0.44–1.00)
GFR, Estimated: 59 mL/min — ABNORMAL LOW (ref >60)
Glucose, Bld: 99 mg/dL (ref 70–99)
Potassium: 3.9 mmol/L (ref 3.5–5.1)
Sodium: 136 mmol/L (ref 135–145)
Total Bilirubin: 0.2 mg/dL (ref 0.0–1.2)
Total Protein: 7.1 g/dL (ref 6.5–8.1)

## 2023-10-25 LAB — CBC
HCT: 38.9 % (ref 36.0–46.0)
Hemoglobin: 12.6 g/dL (ref 12.0–15.0)
MCH: 28.7 pg (ref 26.0–34.0)
MCHC: 32.4 g/dL (ref 30.0–36.0)
MCV: 88.6 fL (ref 80.0–100.0)
Platelets: 338 K/uL (ref 150–400)
RBC: 4.39 MIL/uL (ref 3.87–5.11)
RDW: 14.2 % (ref 11.5–15.5)
WBC: 6.8 K/uL (ref 4.0–10.5)
nRBC: 0 % (ref 0.0–0.2)

## 2023-10-25 LAB — CBG MONITORING, ED: Glucose-Capillary: 109 mg/dL — ABNORMAL HIGH (ref 70–99)

## 2023-10-25 LAB — TSH: TSH: 1.75 u[IU]/mL (ref 0.350–4.500)

## 2023-10-25 MED ORDER — HYDRALAZINE HCL 25 MG PO TABS
25.0000 mg | ORAL_TABLET | Freq: Once | ORAL | Status: AC
  Administered 2023-10-25 (×2): 25 mg via ORAL
  Filled 2023-10-25: qty 1

## 2023-10-25 MED ORDER — HYDROXYZINE HCL 10 MG PO TABS
10.0000 mg | ORAL_TABLET | Freq: Once | ORAL | Status: AC
  Administered 2023-10-25 (×2): 10 mg via ORAL
  Filled 2023-10-25: qty 1

## 2023-10-25 NOTE — ED Provider Notes (Signed)
 [place] [facility] AT [facility] Provider Note   CSN: [id number] Arrival date & time: 10/25/23  1553     Patient presents with: Weakness   Rebecca Stanley is a 89 y.o. female.    Weakness  Patient presents because of bilateral facial burning.  Patient states been present over the past 3 to 4 days.  Patient states that she recently had the COVID-vaccine.  Son at bedside stated that he tested positive for COVID-19 on Friday.  Tested again on Sunday which was negative.  Patient not complain body, chest pain or shortness of breath.  No nausea vomit diarrhea.  Just endorses burning of her face.  Previous medical history reviewed : Patient was last seen in the ED on July 25 because of nasal congestion.  Patient was also seen in the ED in April 2025.  Was seen because chief complaint weakness.  Endorsed ongoing pruritus involving predominantly face that time.  Patient was given a trial 7 days of low potency steroid hydrocortisone .     Prior to Admission medications   Medication Sig Start Date End Date Taking? Authorizing Provider  venlafaxine (EFFEXOR) 37.5 MG tablet Take 75 mg by mouth 2 (two) times daily with a meal. 08/18/23  Yes [provider]  Acetaminophen  Extra Strength 500 MG TABS Take 1,000 mg by mouth in the morning and at bedtime. 08/09/22   [provider]  albuterol  (VENTOLIN  HFA) 108 (90 Base) MCG/ACT inhaler Inhale 2 puffs into the lungs every 6 (six) hours as needed for wheezing or shortness of breath. 09/23/22   Regalado, Belkys A, MD  amiodarone  (PACERONE ) 100 MG tablet Take 1 tablet (100 mg total) by mouth daily. 09/29/23   Furth, Cadence H, PA-C  amLODipine  (NORVASC ) 5 MG tablet Take 1 tablet (5 mg total) by mouth at bedtime. 06/09/23 09/29/23  Furth, Cadence H, PA-C  clonazePAM  (KLONOPIN ) 0.5 MG tablet Take 0.25-0.5 mg by mouth at bedtime.    [provider]  cyanocobalamin  1000 MCG tablet Take 1 tablet (1,000 mcg total) by  mouth daily. 09/24/22   Regalado, Belkys A, MD  diltiazem  (CARDIZEM  CD) 120 MG 24 hr capsule Take 1 capsule (120 mg total) by mouth daily. 02/08/23   Cindie Ole DASEN, MD  diltiazem  (CARDIZEM ) 30 MG tablet Take 1 tablet (30 mg total) by mouth 4 (four) times daily as needed (for tachycardia and palpitations that do not improve with regular meds, fluids, and rest). 12/16/22   Tegeler, Lonni PARAS, MD  fluticasone  (FLONASE ) 50 MCG/ACT nasal spray Place 1 spray into both nostrils 2 (two) times daily as needed for rhinitis. 10/06/23 11/05/23  Ival Domino, FNP  furosemide  (LASIX ) 20 MG tablet Take 1 tablet (20 mg total) by mouth daily as needed. 09/29/23 12/28/23  Furth, Cadence H, PA-C  GEMTESA 75 MG TABS Take 1 tablet by mouth at bedtime.    [provider]  hydrALAZINE  (APRESOLINE ) 25 MG tablet Take 1 tablet (25 mg total) by mouth at bedtime. 09/29/23   Furth, Cadence H, PA-C  hydrocortisone  cream 1 % Apply to affected area 2 times daily (do not apply near eyes or lips) for 7 days 07/11/23   Cottie Donnice PARAS, MD  ipratropium-albuterol  (DUONEB) 0.5-2.5 (3) MG/3ML SOLN Take 3 mLs by nebulization 3 (three) times daily. Patient taking differently: Take 3 mLs by nebulization as needed. 09/23/22   Regalado, Belkys A, MD  levothyroxine  (SYNTHROID , LEVOTHROID) 25 MCG tablet Take 62.5 mcg by mouth daily.    [provider]  mirtazapine (REMERON) 7.5 MG tablet Take 7.5 mg by mouth at bedtime. Patient taking differently: Take 15 mg by mouth at bedtime.    [provider]  polyethylene glycol (MIRALAX / GLYCOLAX) 17 g packet Take 1 packet by mouth daily. 08/09/22   [provider]  potassium chloride  (KLOR-CON  M) 10 MEQ tablet TAKE 1 TABLET (10 MEQ TOTAL) DAILY AS NEEDED (WHEN YOU TAKE LASIX ) Patient taking differently: 10 mEq as needed. 06/28/23   Furth, Cadence H, PA-C    Allergies: Losartan, Amlodipine , Cholestyramine, Cortisone, Prednisone , Tramadol, and Lisinopril    Review  of Systems  Neurological:  Positive for weakness.    Updated Vital Signs BP (!) 146/67   Pulse 69   Temp 97.7 F (36.5 C) (Oral)   Resp 18   Ht 5' 1 (1.549 m)   Wt 68 kg   SpO2 95%   BMI 28.34 kg/m   Physical Exam  (all labs ordered are listed, but only abnormal results are displayed) Labs Reviewed  COMPREHENSIVE METABOLIC PANEL WITH GFR - Abnormal; Notable for the following components:      Result Value   GFR, Estimated 59 (*)    All other components within normal limits  URINALYSIS, ROUTINE W REFLEX MICROSCOPIC - Abnormal; Notable for the following components:   Hgb urine dipstick TRACE (*)    All other components within normal limits  URINALYSIS, MICROSCOPIC (REFLEX) - Abnormal; Notable for the following components:   Bacteria, UA RARE (*)    All other components within normal limits  CBG MONITORING, ED - Abnormal; Notable for the following components:   Glucose-Capillary 109 (*)    All other components within normal limits  RESP PANEL BY RT-PCR (RSV, FLU A&B, COVID)  RVPGX2  CBC  TSH    EKG: EKG Interpretation Date/Time:  Wednesday October 25 2023 16:02:37 EDT Ventricular Rate:  63 PR Interval:  170 QRS Duration:  120 QT Interval:  457 QTC Calculation: 468 R Axis:   78  Text Interpretation: Sinus rhythm IVCD, consider atypical RBBB Confirmed by Simon Rea [phone]) on 10/25/2023 6:49:42 PM  Radiology: No results found.   Procedures   Medications Ordered in the ED  hydrALAZINE  (APRESOLINE ) tablet 25 mg (25 mg Oral Given 10/25/23 1925)  hydrOXYzine  (ATARAX ) tablet 10 mg (10 mg Oral Given 10/25/23 1924)                                    Medical Decision Making Amount and/or Complexity of Data Reviewed Labs: ordered.  Risk Prescription drug management.    Patient presents because of bilateral facial burning.  Patient states been present over the past 3 to 4 days.  Patient states that she recently had the COVID-vaccine.  Son at bedside stated that  he tested positive for COVID-19 on Friday.  Tested again on Sunday which was negative.  Patient not complain body, chest pain or shortness of breath.  No nausea vomit diarrhea.  Just endorses burning of her face.  Previous medical history reviewed : Patient was last seen in the ED on July 25 because of nasal congestion.  Patient was also seen in the ED in April 2025.  Was seen because chief complaint weakness.  Endorsed ongoing pruritus involving predominantly face that time.  Patient was given a trial 7 days of low potency steroid hydrocortisone .  Upon exam, patient Impeklo stable.  ANO x 3 GCS 15.  Slightly hypertensive but otherwise normal vital signs.  Patient remained neurologically intact.  UA did not show, evidence of UTI.  Negative COVID RSV and flu.  No indication for CT head.  No concerns for CVA.  No falls.    Patient was given small dose of Atarax .  Subsequently felt much better after this.  Do think some of this presentation is likely anxiety.    No further indication for workup.  Patient wants to go home which I think is reasonable.     Final diagnoses:  Weakness    ED Discharge Orders     None          Simon Lavonia SAILOR, MD 10/25/23 [phone]

## 2023-10-25 NOTE — ED Triage Notes (Signed)
 Pt c/o generalized weakness x 4 days.  Got Covid vaccine Sunday. Decreased po intake.   Pt repeatedly states that she her face feels like its burning.   Recently changed antidepressants in June.

## 2023-10-25 NOTE — Discharge Instructions (Addendum)
 We think a lot of her presentation had to do with anxiety.  She has no UTI.   Her COVID RSV and flu was negative.   Please continue with your blood pressure medication.

## 2023-11-02 ENCOUNTER — Telehealth: Payer: Self-pay | Admitting: Pharmacist

## 2023-11-02 NOTE — Progress Notes (Signed)
   11/02/2023  Patient ID: Rebecca Stanley, female   DOB: 03-13-1931, 89 y.o.   MRN: [id number]  Telephonic outreach with patient's son, [name]. Medication reconciliation performed at the request of Dr. Clemmie.   [name], PharmD Clinical Pharmacist [place] Direct Dial: [phone]

## 2023-12-26 NOTE — Progress Notes (Unsigned)
  Electrophysiology Office Follow up Visit Note:    Date:  12/27/2023   ID:  Rebecca Stanley, DOB August 18, 1930, MRN [id number]  PCP:  Thurmond Cathlyn LABOR., MD  [facility] Cardiologist:  Lonni Hanson, MD  [facility] Electrophysiologist:  OLE ONEIDA HOLTS, MD    Interval History:     Rebecca Stanley is a 89 y.o. female who presents for a follow up visit.    I last saw the patient June 21, 2023.  She has a history of SVT on amiodarone .  She was doing well at her last appointment  She is doing okay today.  She reports some swelling in her lower extremities.  No palpitations.  She has fatigue and she is wondering whether or not her amiodarone  is contributing to this.  She is with her son in clinic who I know from prior visits.       Past medical, surgical, social and family history were reviewed.  ROS:   Please see the history of present illness.    All other systems reviewed and are negative.  EKGs/Labs/Other Studies Reviewed:    The following studies were reviewed today:          Physical Exam:    VS:  BP 138/60   Pulse (!) 56   Ht 5' 1 (1.549 m)   Wt 156 lb (70.8 kg)   SpO2 91%   BMI 29.48 kg/m     Wt Readings from Last 3 Encounters:  12/27/23 156 lb (70.8 kg)  10/25/23 150 lb (68 kg)  09/29/23 155 lb 9.6 oz (70.6 kg)     GEN: no distress.  Elderly CARD: RRR, No MRG RESP: No IWOB. CTAB.      ASSESSMENT:    1. Paroxysmal SVT (supraventricular tachycardia)   2. Encounter for long-term (current) use of high-risk medication   3. Chronic diastolic heart failure (HCC)    PLAN:    In order of problems listed above:  #SVT #High risk med monitoring-amiodarone  Recent lab work in August showed stable liver and thyroid  function. Continue amiodarone  100 mg by mouth once daily.  Will have her take this Monday through Friday.  #Chronic diastolic heart failure NYHA class II.  Warm and dry on exam.  Continue hydralazine , Lasix .  She will take her Lasix   daily for the next 3 days and then transition to an as needed schedule.  I discussed my upcoming departure from [facility] today.  I will transition her care to one of my EP colleagues, Dr. Kennyth.  Follow-up 6 months with EP APP   Signed, OLE HOLTS, MD, [facility], [facility] 12/27/2023 1:20 PM    Electrophysiology [facility]

## 2023-12-27 ENCOUNTER — Encounter: Payer: Self-pay | Admitting: Cardiology

## 2023-12-27 ENCOUNTER — Ambulatory Visit: Attending: Cardiology | Admitting: Cardiology

## 2023-12-27 VITALS — BP 138/60 | HR 56 | Ht 61.0 in | Wt 156.0 lb

## 2023-12-27 DIAGNOSIS — Z79899 Other long term (current) drug therapy: Secondary | ICD-10-CM | POA: Insufficient documentation

## 2023-12-27 DIAGNOSIS — I471 Supraventricular tachycardia, unspecified: Secondary | ICD-10-CM | POA: Insufficient documentation

## 2023-12-27 DIAGNOSIS — I5032 Chronic diastolic (congestive) heart failure: Secondary | ICD-10-CM | POA: Diagnosis present

## 2023-12-27 MED ORDER — AMIODARONE HCL 100 MG PO TABS: ORAL_TABLET | ORAL | 3 refills | Status: DC

## 2023-12-27 NOTE — Patient Instructions (Signed)
 Medication Instructions:  Your physician has recommended you make the following change in your medication:  1) Take your Lasix  (furosemide ) daily for the next 3 days 2) Change your amiodarone  to 100 mg once daily Monday through Friday  *If you need a refill on your cardiac medications before your next appointment, please call your pharmacy*  Follow-Up: At [facility], you and your health needs are our priority.  As part of our continuing mission to provide you with exceptional heart care, our providers are all part of one team.  This team includes your primary Cardiologist (physician) and Advanced Practice Providers or APPs (Physician Assistants and Nurse Practitioners) who all work together to provide you with the care you need, when you need it.  Your next appointment:   6 months  Provider:   Fonda Kitty, MD or Suzann Riddle, NP

## 2023-12-29 ENCOUNTER — Ambulatory Visit (HOSPITAL_BASED_OUTPATIENT_CLINIC_OR_DEPARTMENT_OTHER): Payer: Self-pay

## 2024-01-11 ENCOUNTER — Other Ambulatory Visit (HOSPITAL_BASED_OUTPATIENT_CLINIC_OR_DEPARTMENT_OTHER): Payer: Self-pay

## 2024-01-11 ENCOUNTER — Other Ambulatory Visit (HOSPITAL_COMMUNITY): Payer: Self-pay

## 2024-01-13 ENCOUNTER — Ambulatory Visit (HOSPITAL_BASED_OUTPATIENT_CLINIC_OR_DEPARTMENT_OTHER): Admit: 2024-01-13 | Discharge: 2024-01-13 | Disposition: A | Discharge status: Home / Self Care | Admitting: Radiology

## 2024-01-13 ENCOUNTER — Encounter (HOSPITAL_BASED_OUTPATIENT_CLINIC_OR_DEPARTMENT_OTHER): Payer: Self-pay

## 2024-01-13 ENCOUNTER — Ambulatory Visit (HOSPITAL_BASED_OUTPATIENT_CLINIC_OR_DEPARTMENT_OTHER)
Admission: RE | Admit: 2024-01-13 | Discharge: 2024-01-13 | Disposition: A | Discharge status: Home / Self Care | Source: Ambulatory Visit | Attending: Family Medicine | Admitting: Family Medicine

## 2024-01-13 DIAGNOSIS — R051 Acute cough: Secondary | ICD-10-CM

## 2024-01-13 DIAGNOSIS — J208 Acute bronchitis due to other specified organisms: Secondary | ICD-10-CM | POA: Diagnosis not present

## 2024-01-13 LAB — POC COVID19/FLU A&B COMBO
Covid Antigen, POC: NEGATIVE
Influenza A Antigen, POC: NEGATIVE
Influenza B Antigen, POC: NEGATIVE

## 2024-01-13 MED ORDER — PREDNISONE 20 MG PO TABS: 20.0000 mg | ORAL_TABLET | Freq: Every day | ORAL | 0 refills | Status: AC

## 2024-01-13 MED ORDER — PROMETHAZINE-DM 6.25-15 MG/5ML PO SYRP: 5.0000 mL | ORAL_SOLUTION | Freq: Four times a day (QID) | ORAL | 0 refills | Status: DC | PRN

## 2024-01-13 MED ORDER — ALBUTEROL SULFATE HFA 108 (90 BASE) MCG/ACT IN AERS: 2.0000 | INHALATION_SPRAY | Freq: Four times a day (QID) | RESPIRATORY_TRACT | 0 refills | Status: AC | PRN

## 2024-01-13 NOTE — Discharge Instructions (Addendum)
 Acute viral bronchitis with cough: Rapid flu and COVID are negative.  Chest x-ray is negative.  Prednisone  20 mg daily for 5 days.  If any problems with worsening blood pressure, stop the prednisone .  Continue albuterol  inhaler, 2 puffs, every 4 hours if needed for wheezing.  Patient does have home nebulizer machine and solution that she could use every 4-6 hours if needed.  Promethazine DM, 5 mL, every 6 hours if needed for cough.  Encouraged to consider taking a COVID flu test at home on the afternoon of 01/14/24.  If the test is abnormal or positive, could call and I would call in Paxlovid at that time.  Follow-up with Dr. Grisso in 2 to 3 weeks or sooner as needed.

## 2024-01-13 NOTE — ED Triage Notes (Signed)
 Congestion Seems to be in her chest High blood pressure issues but seeing PCP  Face is hot but PCP states due to blood pressure medications

## 2024-01-13 NOTE — ED Provider Notes (Signed)
 [facility]    CSN: [id number] Arrival date & time: 01/13/24  1011      History   Chief Complaint Chief Complaint  Patient presents with   Cough    My mother has woke up coughing and seems to have congestion in her chest.  She is 28 and I feel like I need to get her checked out. - Entered by patient    HPI Rebecca Stanley is a 89 y.o. female.   89 year old female brought in by her son with cough, congestion and some shortness of breath.  She feels like she has got some kind of congestion in her chest.  Symptoms since 01/11/2024 or even on 01/12/24.  She also has hypertension and sees Dr. Thurmond.  She saw Dr. Thurmond on 01/11/2024 and had an evaluation and blood work.  She has episodes where she feels like her face is really hot but her primary care told her that was a possible side effect of some of her blood pressure medications.  She also just feels weak and washed out.  Patient has had some more recent persistent constipation.  Patient and her son reports that she has had to use MiraLAX and even had to insert a finger rectally to help some stool come out in the last 24 hours.  She still feels very constipated and like she needs to get empty of stool.  She is having some abdominal pain that she associates with constipation.   Cough Associated symptoms: rhinorrhea and shortness of breath   Associated symptoms: no chest pain, no chills, no ear pain, no fever, no rash and no sore throat     Past Medical History:  Diagnosis Date   (HFpEF) heart failure with preserved ejection fraction (HCC)    a. 09/2020 Echo: EF 55-60%, no rwma, Gr1DD, nl RV size/fxn. RVSP 48.41mmHg. Mild LAE. Mild MR.   Allergy    Arthritis    Blood transfusion    Cancer [facility])    Colitis    Colon polyp    Depression    Diverticulosis    Heart murmur    History of cardiac catheterization    a. 05/2016 Cath ([place]): Nl cors. EF 65%. LVEDP .   History of stress test    a. 11/2018 MV: EF 55-65%,  no ischemia/infarct.   Hyperlipidemia    Hypertension    Mitral valve prolapse    a. 09/2020 Echo: Mild MR.   Osteoporosis    Palpitations    a. 07/2020 reported tachycardia-->08/2020 [facility]: RSR 78 (49-143). Rare PACs/PVCs. 3 atrial runs up to 10 beats, max 162 bpm. Triggered events = sinus rhythm.   Thyroid  disease     Patient Active Problem List   Diagnosis Date Noted   Supraventricular tachycardia 04/08/2023   Acute on chronic diastolic CHF (congestive heart failure) (HCC) 09/21/2022   Peripheral neuropathy 09/21/2022   Hypothyroidism 09/21/2022   Acute bronchitis with bronchospasm 09/21/2022   Acute respiratory failure with hypoxia (HCC) 09/20/2022   Syncope 01/20/2021   Palpitations 08/07/2020   Leg edema 12/27/2018   Chronic fatigue 08/18/2017   Uncontrolled hypertension 07/11/2017   Shortness of breath 08/16/2010   Mitral valve prolapse    Mixed hyperlipidemia    DIARRHEA 04/05/2010   Diverticulosis of colon 04/02/2010   CONSTIPATION, CHRONIC 04/02/2010   History of colonic polyps 04/02/2010    Past Surgical History:  Procedure Laterality Date   BELPHAROPTOSIS REPAIR     bladder tack  CATARACT EXTRACTION     COLONOSCOPY     DILATION AND CURETTAGE, DIAGNOSTIC / THERAPEUTIC     gluteous medius tendon rupture     done at [facility]   heel tendon repair     HIP ARTHROSCOPY W/ LABRAL REPAIR     kyploplasty     T12, L1, L2, L3, done at [facility]   PARTIAL HYSTERECTOMY      OB History   No obstetric history on file.      Home Medications    Prior to Admission medications   Medication Sig Start Date End Date Taking? Authorizing Provider  predniSONE  (DELTASONE ) 20 MG tablet Take 1 tablet (20 mg total) by mouth daily with breakfast for 5 days. 01/13/24 01/18/24 Yes Ival Domino, FNP  promethazine-dextromethorphan (PROMETHAZINE-DM) 6.25-15 MG/5ML syrup Take 5 mLs by mouth 4 (four) times daily as needed for cough. Do not use and drive - May make drowsy. 01/13/24  Yes Ival Domino, FNP  albuterol  (VENTOLIN  HFA) 108 (90 Base) MCG/ACT inhaler Inhale 2 puffs into the lungs every 6 (six) hours as needed for wheezing or shortness of breath. 01/13/24   Ival Domino, FNP  amiodarone  (PACERONE ) 100 MG tablet Take 1 tablet (100 mg) once daily Monday through Friday 12/27/23   Cindie Ole DASEN, MD  amLODipine  (NORVASC ) 5 MG tablet Take 1 tablet (5 mg total) by mouth at bedtime. 06/09/23 12/27/23  Furth, Cadence H, PA-C  diltiazem  (CARDIZEM  CD) 120 MG 24 hr capsule Take 1 capsule (120 mg total) by mouth daily. 02/08/23   Cindie Ole DASEN, MD  diltiazem  (CARDIZEM ) 30 MG tablet Take 1 tablet (30 mg total) by mouth 4 (four) times daily as needed (for tachycardia and palpitations that do not improve with regular meds, fluids, and rest). 12/16/22   Tegeler, Lonni PARAS, MD  fluticasone  (FLONASE ) 50 MCG/ACT nasal spray Place 1 spray into both nostrils 2 (two) times daily as needed for rhinitis. 10/06/23 12/27/23  Ival Domino, FNP  furosemide  (LASIX ) 20 MG tablet Take 1 tablet (20 mg total) by mouth daily as needed. 09/29/23 12/28/23  Furth, Cadence H, PA-C  hydrALAZINE  (APRESOLINE ) 25 MG tablet Take 1 tablet (25 mg total) by mouth at bedtime. 09/29/23   Furth, Cadence H, PA-C  ipratropium-albuterol  (DUONEB) 0.5-2.5 (3) MG/3ML SOLN Take 3 mLs by nebulization 3 (three) times daily. Patient taking differently: Take 3 mLs by nebulization as needed. 09/23/22   Regalado, Belkys A, MD  levothyroxine  (SYNTHROID , LEVOTHROID) 25 MCG tablet Take 62.5 mcg by mouth daily.    [provider]  mirtazapine (REMERON) 15 MG tablet 15 mg at bedtime.    [provider]  mirtazapine (REMERON) 7.5 MG tablet Take 7.5 mg by mouth at bedtime. Patient taking differently: Take 15 mg by mouth at bedtime.    [provider]  potassium chloride  (KLOR-CON  M) 10 MEQ tablet TAKE 1 TABLET (10 MEQ TOTAL) DAILY AS NEEDED (WHEN YOU TAKE LASIX ) Patient taking differently: 10 mEq as needed. 06/28/23    Furth, Cadence H, PA-C  venlafaxine (EFFEXOR) 37.5 MG tablet Take 75 mg by mouth 2 (two) times daily with a meal. 08/18/23   [provider]    Family History Family History  Problem Relation Age of Onset   Heart disease Mother    Heart disease Other        aunt   Diabetes Other        grandmother   Breast cancer Other        aunt  Ovarian cancer Other        cousin   Rheum arthritis Father    Colon cancer Neg Hx     Social History Social History   Tobacco Use   Smoking status: Never   Smokeless tobacco: Never  Vaping Use   Vaping status: Never Used  Substance Use Topics   Alcohol use: No   Drug use: No     Allergies   Losartan, Amlodipine , Cholestyramine, Cortisone, Prednisone , Tramadol, and Lisinopril   Review of Systems Review of Systems  Constitutional:  Positive for fatigue. Negative for chills and fever.  HENT:  Positive for congestion, postnasal drip and rhinorrhea. Negative for ear pain and sore throat.   Eyes:  Negative for pain and visual disturbance.  Respiratory:  Positive for cough and shortness of breath.   Cardiovascular:  Negative for chest pain and palpitations.  Gastrointestinal:  Negative for abdominal pain, constipation, diarrhea, nausea and vomiting.  Genitourinary:  Negative for dysuria and hematuria.  Musculoskeletal:  Negative for arthralgias and back pain.  Skin:  Negative for color change and rash.  Neurological:  Positive for weakness. Negative for seizures and syncope.  All other systems reviewed and are negative.    Physical Exam Triage Vital Signs ED Triage Vitals  Encounter Vitals Group     BP 01/13/24 1049 (!) (P) 147/70     Girls Systolic BP Percentile --      Girls Diastolic BP Percentile --      Boys Systolic BP Percentile --      Boys Diastolic BP Percentile --      Pulse Rate 01/13/24 1049 (!) (P) 55     Resp 01/13/24 1049 (P) 16     Temp 01/13/24 1049 (P) 98.4 F (36.9 C)     Temp Source 01/13/24 1049  (P) Oral     SpO2 01/13/24 1049 (P) 91 %     Weight --      Height --      Head Circumference --      Peak Flow --      Pain Score 01/13/24 1045 0     Pain Loc --      Pain Education --      Exclude from Growth Chart --    No data found.  Updated Vital Signs BP (!) (P) 147/70 (BP Location: Right Arm)   Pulse (!) (P) 55   Temp (P) 98.4 F (36.9 C) (Oral)   Resp (P) 16   SpO2 (P) 91%   Visual Acuity Right Eye Distance:   Left Eye Distance:   Bilateral Distance:    Right Eye Near:   Left Eye Near:    Bilateral Near:     Physical Exam Vitals and nursing note reviewed.  Constitutional:      General: She is not in acute distress.    Appearance: She is well-developed. She is not ill-appearing, toxic-appearing or diaphoretic.  HENT:     Head: Normocephalic and atraumatic.     Right Ear: Tympanic membrane, ear canal and external ear normal. Decreased hearing (Uses hearing aids bilaterally) noted.     Left Ear: Tympanic membrane, ear canal and external ear normal. Decreased hearing (Uses hearing aids bilaterally) noted.     Nose: Congestion and rhinorrhea present. Rhinorrhea is clear.     Right Sinus: Maxillary sinus tenderness and frontal sinus tenderness present.     Left Sinus: Maxillary sinus tenderness and frontal sinus tenderness present.     Comments: Mild  to moderate sinus tenderness throughout.    Mouth/Throat:     Lips: Pink.     Mouth: Mucous membranes are moist.     Pharynx: Uvula midline. No oropharyngeal exudate or posterior oropharyngeal erythema.     Tonsils: No tonsillar exudate.  Eyes:     Conjunctiva/sclera: Conjunctivae normal.     Pupils: Pupils are equal, round, and reactive to light.  Cardiovascular:     Rate and Rhythm: Normal rate and regular rhythm.     Heart sounds: S1 normal and S2 normal. No murmur heard. Pulmonary:     Effort: Pulmonary effort is normal. No respiratory distress.     Breath sounds: Examination of the right-middle field  reveals wheezing. Examination of the left-middle field reveals wheezing. Examination of the right-lower field reveals decreased breath sounds and wheezing. Examination of the left-lower field reveals decreased breath sounds and wheezing. Decreased breath sounds and wheezing (Rear, faint, intermittent wheezing) present. No rhonchi or rales.  Abdominal:     General: Bowel sounds are normal.     Palpations: Abdomen is soft.     Tenderness: There is abdominal tenderness in the right lower quadrant and epigastric area. There is no right CVA tenderness, left CVA tenderness, guarding or rebound. Negative signs include Murphy's sign, Rovsing's sign and McBurney's sign.  Musculoskeletal:        General: No swelling.     Cervical back: Neck supple.  Lymphadenopathy:     Head:     Right side of head: No submental, submandibular, tonsillar, preauricular or posterior auricular adenopathy.     Left side of head: No submental, submandibular, tonsillar, preauricular or posterior auricular adenopathy.     Cervical: Cervical adenopathy present.     Right cervical: Superficial cervical adenopathy present.     Left cervical: Superficial cervical adenopathy present.  Skin:    General: Skin is warm and dry.     Capillary Refill: Capillary refill takes less than 2 seconds.     Findings: No rash.  Neurological:     Mental Status: She is alert and oriented to person, place, and time.     Gait: Gait abnormal (Unsteady gait and walks with a rolling walker).  Psychiatric:        Mood and Affect: Mood normal.      UC Treatments / Results  Labs (all labs ordered are listed, but only abnormal results are displayed) CBC without Differential: 01/10/34:  Order: [id number] Component Ref Range & Units 2 d ago  WBC 4.40 - 11.00 10*3/uL 8.00  RBC 4.10 - 5.10 10*6/uL 4.26  Hemoglobin 12.3 - 15.3 g/dL 87.6  Hematocrit 64.0 - 44.6 % 37.3  Mean Corpuscular Volume (MCV) 80.0 - 96.0 fL 87.5  Mean Corpuscular  Hemoglobin (MCH) 27.5 - 33.2 pg 28.9  Mean Corpuscular Hemoglobin Conc (MCHC) 33.0 - 37.0 g/dL 66.9  Red Cell Distribution Width (RDW) 12.3 - 17.0 % 14.2  Platelet Count (PLT) 150 - 450 10*3/uL 340  Mean Platelet Volume (MPV) 6.8 - 10.2 fL 9.8   Specimen Collected: 01/11/24 16:46   Performed by: [facility](CLIA# [id number]) Last Resulted: 01/11/24 21:26    Contains abnormal data Comprehensive Metabolic Panel: 01/10/34:  Order: [id number] Component Ref Range & Units 2 d ago  Sodium 136 - 145 mmol/L 137  Potassium 3.5 - 5.1 mmol/L 4.7  Comment: NO VISIBLE HEMOLYSIS  Chloride 98 - 107 mmol/L 97 Low   CO2 21 - 31 mmol/L 31  Comment: High lactate  dehydrogenase (LDH) concentrations in patient samples may cause falsely increased bicarbonate results. If markedly elevated LDH levels are suspected, please assess results in conjunction with patient's LDH values.  Anion Gap 6 - 14 mmol/L 9  Glucose, Random 70 - 99 mg/dL 87  Blood Urea Nitrogen (BUN) 7 - 25 mg/dL 24  Creatinine 9.39 - 8.79 mg/dL 9.19  eGFR >40 fO/fpw/8.26f7 70  Comment: GFR estimated by CKD-EPI equations(NKF 2021).  Recommend confirmation of Cr-based eGFR by using Cys-based eGFR and other filtration markers (if applicable) in complex cases and clinical decision-making, as needed.  Albumin 3.5 - 5.7 g/dL 4.6  Total Protein 6.4 - 8.9 g/dL 7.3  Bilirubin, Total 0.3 - 1.0 mg/dL 0.4  Alkaline Phosphatase (ALP) 34 - 104 U/L 66  Aspartate Aminotransferase (AST) 13 - 39 U/L 19  Alanine Aminotransferase (ALT) 7 - 52 U/L 15  Calcium  8.6 - 10.3 mg/dL 89.8  BUN/Creatinine Ratio   Comment: Creatinine is normal, ratio is not clinically indicated.   Specimen Collected: 01/11/24 16:46   Performed by: [facility](CLIA# [id number]) Last Resulted: 01/11/24 21:48   T4, Free: 01/10/34:  Order: [id number] Component Ref Range & Units 2 d ago  T4, Free 0.6 - 1.1  ng/dL 1.1  Narrative  The testing method is an immunoassay manufactured by [employer] performed on [employer].  This immunoassay shows no significant interference from Biotin at three times the maximum expected clinical concentration.  Values obtained with different assay methods may be different and cannot be used interchangeably.  Specimen Collected: 01/11/24 16:46   Performed by: [facility](CLIA# [id number]) Last Resulted: 01/11/24 21:57   TSH:01/10/34:  Order: [id number]  suggestion  Information displayed in this report may not trend or trigger automated decision support.   Component Ref Range & Units 2 d ago  TSH 0.450 - 5.330 uIU/mL 4.380  Narrative  Female Patients >12 years only:  Pregnant Females, 1st Trimester: 0.050 - 3.700 IU/mL  Pregnant Females, 2nd Trimester: 0.310 - 4.350 IU/mL  Pregnant Females, 3rd Trimester: 0.410 - 5.180 IU/mL  Specimen Collected: 01/11/24 16:46   Performed by: [facility](CLIA# [id number]) Last Resulted: 01/11/24 21:57    Contains abnormal data POC Urinalysis without Microscopic (Manual Read): 01/10/34:  Order: [id number] Component Ref Range & Units 2 d ago  Color, Urine Yellow Yellow  Clarity, Urine Clear Clear  Glucose, Urine Negative mg/dL Negative  Bilirubin, Urine Negative Negative  Ketones, Urine Negative mg/dL Negative  Specific Gravity, Urine 1.010, 1.015, 1.020, 1.025 <=1.005 Abnormal   Blood, Urine Negative Negative  pH, Urine 5.0, 5.5, 6.0, 6.5, 7.0, 7.5, 8.0 6.0  Protein, Urine Negative mg/dL Negative  Urobilinogen, Urine <2.0 mg/dL 0.2  Nitrite, Urine Negative Negative  Leukocyte Esterase, Urine Negative Negative  Kit/Device Lot # [id number]  Kit/Device Expiration Date 02/11/24   Specimen Collected: 01/11/24 16:24   Performed by: [facility](CLIA# [id number]) Last Resulted: 01/11/24 16:24    EKG   Radiology DG Chest 2  View Result Date: 01/13/2024 EXAM: 2 VIEW(S) XRAY OF THE CHEST 01/13/2024 12:25:00 PM COMPARISON: 11/03/223 status post right shoulder arthroplasty. CLINICAL HISTORY: cough FINDINGS: LUNGS AND PLEURA: No focal pulmonary opacity. No pulmonary edema. No pleural effusion. No pneumothorax. HEART AND MEDIASTINUM: No acute abnormality of the cardiac and mediastinal silhouettes. BONES AND SOFT TISSUES: No acute osseous abnormality. IMPRESSION: 1. No acute cardiopulmonary abnormality. Electronically signed by: Lynwood Seip MD 01/13/2024 12:29 PM EDT RP  Workstation: [id number]    Procedures Procedures (including critical care time)  Medications Ordered in UC Medications - No data to display  Initial Impression / Assessment and Plan / UC Course  I have reviewed the triage vital signs and the nursing notes.  Pertinent labs & imaging results that were available during my care of the patient were reviewed by me and considered in my medical decision making (see chart for details).  Plan of Care: Acute viral bronchitis with cough: Rapid flu and COVID are negative.  Chest x-ray is negative.  Prednisone  20 mg daily for 5 days.  If any problems with worsening blood pressure, stop the prednisone .  Continue albuterol  inhaler, 2 puffs, every 4 hours if needed for wheezing.  Patient does have home nebulizer machine and solution that she could use every 4-6 hours if needed.  Promethazine DM, 5 mL, every 6 hours if needed for cough.  Encouraged to consider taking a COVID flu test at home on the afternoon of 01/14/24.  If the test is abnormal or positive, could call and I would call in Paxlovid at that time.  Follow-up with Dr. Grisso in 2 to 3 weeks or sooner as needed.  I reviewed the plan of care with the patient and/or the patient's guardian.  The patient and/or guardian had time to ask questions and acknowledged that the questions were answered.  I provided instruction on symptoms or reasons to return here or to go  to an ER, if symptoms/condition did not improve, worsened or if new symptoms occurred.  Final Clinical Impressions(s) / UC Diagnoses   Final diagnoses:  Acute cough  Acute viral bronchitis     Discharge Instructions      Acute viral bronchitis with cough: Rapid flu and COVID are negative.  Chest x-ray is negative.  Prednisone  20 mg daily for 5 days.  If any problems with worsening blood pressure, stop the prednisone .  Continue albuterol  inhaler, 2 puffs, every 4 hours if needed for wheezing.  Patient does have home nebulizer machine and solution that she could use every 4-6 hours if needed.  Promethazine DM, 5 mL, every 6 hours if needed for cough.  Encouraged to consider taking a COVID flu test at home on the afternoon of 01/14/24.  If the test is abnormal or positive, could call and I would call in Paxlovid at that time.  Follow-up with Dr. Grisso in 2 to 3 weeks or sooner as needed.     ED Prescriptions     Medication Sig Dispense Auth. Provider   albuterol  (VENTOLIN  HFA) 108 (90 Base) MCG/ACT inhaler Inhale 2 puffs into the lungs every 6 (six) hours as needed for wheezing or shortness of breath. 1 each Ival Domino, FNP   promethazine-dextromethorphan (PROMETHAZINE-DM) 6.25-15 MG/5ML syrup Take 5 mLs by mouth 4 (four) times daily as needed for cough. Do not use and drive - May make drowsy. 118 mL Ival Domino, FNP   predniSONE  (DELTASONE ) 20 MG tablet Take 1 tablet (20 mg total) by mouth daily with breakfast for 5 days. 5 tablet Sherrill Buikema, FNP      PDMP not reviewed this encounter.   Ival Domino, FNP 01/13/24 1314

## 2024-01-28 ENCOUNTER — Ambulatory Visit (HOSPITAL_BASED_OUTPATIENT_CLINIC_OR_DEPARTMENT_OTHER)
Admission: RE | Admit: 2024-01-28 | Discharge: 2024-01-28 | Disposition: A | Discharge status: Home / Self Care | Source: Ambulatory Visit | Attending: Physician Assistant | Admitting: Physician Assistant

## 2024-01-28 ENCOUNTER — Encounter (HOSPITAL_BASED_OUTPATIENT_CLINIC_OR_DEPARTMENT_OTHER): Payer: Self-pay

## 2024-01-28 VITALS — BP 137/75 | HR 59 | Temp 98.2°F | Resp 18

## 2024-01-28 DIAGNOSIS — R519 Headache, unspecified: Secondary | ICD-10-CM | POA: Diagnosis not present

## 2024-01-28 LAB — POCT URINE DIPSTICK
Bilirubin, UA: NEGATIVE
Blood, UA: NEGATIVE
Glucose, UA: NEGATIVE mg/dL
Ketones, POC UA: NEGATIVE mg/dL
Leukocytes, UA: NEGATIVE
Nitrite, UA: NEGATIVE
Protein Ur, POC: NEGATIVE mg/dL
Spec Grav, UA: 1.025 (ref 1.010–1.025)
Urobilinogen, UA: 1 U/dL
pH, UA: 5.5 (ref 5.0–8.0)

## 2024-01-28 NOTE — Discharge Instructions (Signed)
 There are no signs of infection on the urinalysis today.  If headaches are not improving with tylenol  or symptoms become worse recommend more emergent follow up in the Emergency Department.  Please contact provider about headaches and facial flushing, this could possibly be from recent medication change.

## 2024-01-28 NOTE — ED Triage Notes (Addendum)
 Pt c/o headache started at 8am and she feels hot in her face, she did take tylenol  and the headache has improved. Pt son is worried about a stroke. Pt recently had change of medication with her venlafaxine.

## 2024-01-28 NOTE — ED Provider Notes (Signed)
 Rebecca Stanley CARE    CSN: [id number] Arrival date & time: 01/28/24  1200      History   Chief Complaint Chief Complaint  Patient presents with   Headache    My mother is not feeling well and seems to be having a lot of weakness and headaches.  We are not sure if she has a sinus infection. - Entered by patient    HPI Rebecca Stanley is a 89 y.o. female.   Patient brought in by son who reports patient began having headache around 8 AM today.  She also complains of her face feeling hot or flushed.  She did take Tylenol  with improvement of pain.  She recently had a change in medication from immediate release venlafaxine to extended release.  Denies recent falls or injuries.  Denies visual changes or worsening with balance.  She does have some baseline confusion due to Alzheimer's dementia but this is unchanged.    Past Medical History:  Diagnosis Date   (HFpEF) heart failure with preserved ejection fraction (HCC)    a. 09/2020 Echo: EF 55-60%, no rwma, Gr1DD, nl RV size/fxn. RVSP 48.71mmHg. Mild LAE. Mild MR.   Allergy    Arthritis    Blood transfusion    Cancer [facility])    Colitis    Colon polyp    Depression    Diverticulosis    Heart murmur    History of cardiac catheterization    a. 05/2016 Cath ([place]): Nl cors. EF 65%. LVEDP .   History of stress test    a. 11/2018 MV: EF 55-65%, no ischemia/infarct.   Hyperlipidemia    Hypertension    Mitral valve prolapse    a. 09/2020 Echo: Mild MR.   Osteoporosis    Palpitations    a. 07/2020 reported tachycardia-->08/2020 Zio: RSR 78 (49-143). Rare PACs/PVCs. 3 atrial runs up to 10 beats, max 162 bpm. Triggered events = sinus rhythm.   Thyroid  disease     Patient Active Problem List   Diagnosis Date Noted   Supraventricular tachycardia 04/08/2023   Acute on chronic diastolic CHF (congestive heart failure) (HCC) 09/21/2022   Peripheral neuropathy 09/21/2022   Hypothyroidism 09/21/2022   Acute bronchitis with  bronchospasm 09/21/2022   Acute respiratory failure with hypoxia (HCC) 09/20/2022   Syncope 01/20/2021   Palpitations 08/07/2020   Leg edema 12/27/2018   Chronic fatigue 08/18/2017   Uncontrolled hypertension 07/11/2017   Shortness of breath 08/16/2010   Mitral valve prolapse    Mixed hyperlipidemia    DIARRHEA 04/05/2010   Diverticulosis of colon 04/02/2010   CONSTIPATION, CHRONIC 04/02/2010   History of colonic polyps 04/02/2010    Past Surgical History:  Procedure Laterality Date   BELPHAROPTOSIS REPAIR     bladder tack     CATARACT EXTRACTION     COLONOSCOPY     DILATION AND CURETTAGE, DIAGNOSTIC / THERAPEUTIC     gluteous medius tendon rupture     done at [facility]   heel tendon repair     HIP ARTHROSCOPY W/ LABRAL REPAIR     kyploplasty     T12, L1, L2, L3, done at [facility]   PARTIAL HYSTERECTOMY      OB History   No obstetric history on file.      Home Medications    Prior to Admission medications   Medication Sig Start Date End Date Taking? Authorizing Provider  venlafaxine XR (EFFEXOR-XR) 150 MG 24 hr capsule Take 150 mg by mouth. 01/17/24 01/16/25  Yes [provider]  albuterol  (VENTOLIN  HFA) 108 (90 Base) MCG/ACT inhaler Inhale 2 puffs into the lungs every 6 (six) hours as needed for wheezing or shortness of breath. 01/13/24   Ival Domino, FNP  amiodarone  (PACERONE ) 100 MG tablet Take 1 tablet (100 mg) once daily Monday through Friday 12/27/23   Cindie Ole DASEN, MD  amLODipine  (NORVASC ) 5 MG tablet Take 1 tablet (5 mg total) by mouth at bedtime. 06/09/23 12/27/23  Furth, Cadence H, PA-C  diltiazem  (CARDIZEM  CD) 120 MG 24 hr capsule Take 1 capsule (120 mg total) by mouth daily. 02/08/23   Cindie Ole DASEN, MD  diltiazem  (CARDIZEM ) 30 MG tablet Take 1 tablet (30 mg total) by mouth 4 (four) times daily as needed (for tachycardia and palpitations that do not improve with regular meds, fluids, and rest). 12/16/22   Tegeler, Lonni PARAS, MD  fluticasone   (FLONASE ) 50 MCG/ACT nasal spray Place 1 spray into both nostrils 2 (two) times daily as needed for rhinitis. 10/06/23 12/27/23  Ival Domino, FNP  furosemide  (LASIX ) 20 MG tablet Take 1 tablet (20 mg total) by mouth daily as needed. 09/29/23 12/28/23  Furth, Cadence H, PA-C  hydrALAZINE  (APRESOLINE ) 25 MG tablet Take 1 tablet (25 mg total) by mouth at bedtime. 09/29/23   Furth, Cadence H, PA-C  ipratropium-albuterol  (DUONEB) 0.5-2.5 (3) MG/3ML SOLN Take 3 mLs by nebulization 3 (three) times daily. Patient taking differently: Take 3 mLs by nebulization as needed. 09/23/22   Regalado, Belkys A, MD  levothyroxine  (SYNTHROID , LEVOTHROID) 25 MCG tablet Take 62.5 mcg by mouth daily.    [provider]  mirtazapine (REMERON) 15 MG tablet 15 mg at bedtime.    [provider]  mirtazapine (REMERON) 7.5 MG tablet Take 7.5 mg by mouth at bedtime. Patient taking differently: Take 15 mg by mouth at bedtime.    [provider]  potassium chloride  (KLOR-CON  M) 10 MEQ tablet TAKE 1 TABLET (10 MEQ TOTAL) DAILY AS NEEDED (WHEN YOU TAKE LASIX ) Patient taking differently: 10 mEq as needed. 06/28/23   Furth, Cadence H, PA-C  promethazine-dextromethorphan (PROMETHAZINE-DM) 6.25-15 MG/5ML syrup Take 5 mLs by mouth 4 (four) times daily as needed for cough. Do not use and drive - May make drowsy. 01/13/24   Ival Domino, FNP  venlafaxine (EFFEXOR) 37.5 MG tablet Take 75 mg by mouth 2 (two) times daily with a meal. 08/18/23   [provider]    Family History Family History  Problem Relation Age of Onset   Heart disease Mother    Heart disease Other        aunt   Diabetes Other        grandmother   Breast cancer Other        aunt   Ovarian cancer Other        cousin   Rheum arthritis Father    Colon cancer Neg Hx     Social History Social History   Tobacco Use   Smoking status: Never   Smokeless tobacco: Never  Vaping Use   Vaping status: Never Used  Substance Use Topics    Alcohol use: No   Drug use: No     Allergies   Losartan, Amlodipine , Cholestyramine, Cortisone, Prednisone , Tramadol, and Lisinopril   Review of Systems Review of Systems  Constitutional:  Negative for chills and fever.  HENT:  Negative for ear pain and sore throat.   Eyes:  Negative for pain and visual disturbance.  Respiratory:  Negative for cough and  shortness of breath.   Cardiovascular:  Negative for chest pain and palpitations.  Gastrointestinal:  Negative for abdominal pain and vomiting.  Genitourinary:  Negative for dysuria and hematuria.  Musculoskeletal:  Negative for arthralgias and back pain.  Skin:  Negative for color change and rash.  Neurological:  Positive for headaches. Negative for seizures and syncope.  All other systems reviewed and are negative.    Physical Exam Triage Vital Signs ED Triage Vitals  Encounter Vitals Group     BP 01/28/24 1238 137/75     Girls Systolic BP Percentile --      Girls Diastolic BP Percentile --      Boys Systolic BP Percentile --      Boys Diastolic BP Percentile --      Pulse Rate 01/28/24 1238 (!) 59     Resp 01/28/24 1238 18     Temp 01/28/24 1238 98.2 F (36.8 C)     Temp Source 01/28/24 1238 Oral     SpO2 01/28/24 1238 (!) 89 %     Weight --      Height --      Head Circumference --      Peak Flow --      Pain Score 01/28/24 1235 0     Pain Loc --      Pain Education --      Exclude from Growth Chart --    No data found.  Updated Vital Signs BP 137/75 (BP Location: Right Arm)   Pulse (!) 59   Temp 98.2 F (36.8 C) (Oral)   Resp 18   SpO2 (!) 89%   Visual Acuity Right Eye Distance:   Left Eye Distance:   Bilateral Distance:    Right Eye Near:   Left Eye Near:    Bilateral Near:     Physical Exam Vitals and nursing note reviewed.  Constitutional:      General: She is not in acute distress.    Appearance: She is well-developed.  HENT:     Head: Normocephalic and atraumatic.  Eyes:      Conjunctiva/sclera: Conjunctivae normal.  Cardiovascular:     Rate and Rhythm: Normal rate and regular rhythm.     Heart sounds: No murmur heard. Pulmonary:     Effort: Pulmonary effort is normal. No respiratory distress.     Breath sounds: Normal breath sounds.  Abdominal:     Palpations: Abdomen is soft.     Tenderness: There is no abdominal tenderness.  Musculoskeletal:        General: No swelling.     Cervical back: Neck supple.  Skin:    General: Skin is warm and dry.     Capillary Refill: Capillary refill takes less than 2 seconds.  Neurological:     Mental Status: She is alert.  Psychiatric:        Mood and Affect: Mood normal.      UC Treatments / Results  Labs (all labs ordered are listed, but only abnormal results are displayed) Labs Reviewed  POCT URINE DIPSTICK - Normal    EKG   Radiology No results found.  Procedures Procedures (including critical care time)  Medications Ordered in UC Medications - No data to display  Initial Impression / Assessment and Plan / UC Course  I have reviewed the triage vital signs and the nursing notes.  Pertinent labs & imaging results that were available during my care of the patient were reviewed by me and considered in  my medical decision making (see chart for details).     Patient overall well-appearing in no acute distress today.  Headache improved with Tylenol .  No visual changes.  Neuroexam is normal.  No signs of stroke.  No recent falls or head injuries.  UA normal in clinic today.  Headaches and facial flushing may be attributed recent change in medications.  Advised to contact psychiatrist on Monday.  ED precautions given, advised to go to ED if symptoms become worse or more concerning. Final Clinical Impressions(s) / UC Diagnoses   Final diagnoses:  Acute nonintractable headache, unspecified headache type     Discharge Instructions      There are no signs of infection on the urinalysis today.  If  headaches are not improving with tylenol  or symptoms become worse recommend more emergent follow up in the Emergency Department.  Please contact provider about headaches and facial flushing, this could possibly be from recent medication change.     ED Prescriptions   None    PDMP not reviewed this encounter.   Ward, Harlene PEDLAR, PA-C 01/28/24 1407

## 2024-01-29 ENCOUNTER — Other Ambulatory Visit: Payer: Self-pay | Admitting: Cardiology

## 2024-02-02 ENCOUNTER — Ambulatory Visit (HOSPITAL_BASED_OUTPATIENT_CLINIC_OR_DEPARTMENT_OTHER): Admitting: Pulmonary Disease

## 2024-02-19 ENCOUNTER — Ambulatory Visit (INDEPENDENT_AMBULATORY_CARE_PROVIDER_SITE_OTHER): Admitting: Pulmonary Disease

## 2024-02-19 ENCOUNTER — Encounter (HOSPITAL_BASED_OUTPATIENT_CLINIC_OR_DEPARTMENT_OTHER): Payer: Self-pay | Admitting: Pulmonary Disease

## 2024-02-19 VITALS — BP 134/61 | HR 62 | Ht 61.0 in | Wt 157.5 lb

## 2024-02-19 DIAGNOSIS — E785 Hyperlipidemia, unspecified: Secondary | ICD-10-CM

## 2024-02-19 DIAGNOSIS — J42 Unspecified chronic bronchitis: Secondary | ICD-10-CM

## 2024-02-19 DIAGNOSIS — I10 Essential (primary) hypertension: Secondary | ICD-10-CM

## 2024-02-19 DIAGNOSIS — I5032 Chronic diastolic (congestive) heart failure: Secondary | ICD-10-CM

## 2024-02-19 DIAGNOSIS — I471 Supraventricular tachycardia, unspecified: Secondary | ICD-10-CM

## 2024-02-19 DIAGNOSIS — F32A Depression, unspecified: Secondary | ICD-10-CM

## 2024-02-19 DIAGNOSIS — I341 Nonrheumatic mitral (valve) prolapse: Secondary | ICD-10-CM

## 2024-02-19 MED ORDER — ALBUTEROL SULFATE 0.63 MG/3ML IN NEBU: 1.0000 | INHALATION_SOLUTION | Freq: Four times a day (QID) | RESPIRATORY_TRACT | 1 refills | Status: AC | PRN

## 2024-02-19 NOTE — Patient Instructions (Signed)
 History of severe acute bronchitis Deconditioning, post-op and recent infection --CONTINUE Albuterol  spray or nebulizer AS NEEDED for wheezing or shortness of breath

## 2024-02-19 NOTE — Progress Notes (Signed)
 Subjective:   PATIENT ID: Rebecca Stanley GENDER: female DOB: [birth year], MRN: [id number]   HPI  Chief Complaint  Patient presents with   Follow-up    Chronic bronchitis    Reason for Visit: Follow-up  Ms. Wilba Mutz is a 89 year old female never smoker with mitral valve proplapse, OA s/p right TKA 2024, depression, HLD, HTN who presents for follow-up.   Initial consult Previously followed by Dr. Shellia. Last seen in 02/2018 She has had bronchitis that began 5-6 weeks ago. Seen in urgent care she received antibiotic and inhaler. Did not improved and returned to urgent care for nebulizer and IM steroid and IM antibiotics. CXR neg for pneumonia. On 3/16 and 3/29 for similar symptoms. Steroids seemed to improve. She followed up her PCP. She began having cough, shortness of breath after completing steroids. Returned to urgent care for additional steroids. After completing this two weeks ago, she has had no further symptoms.  She previously had COVID in 11/2020 that was severe. But improved after Paxlovid. She reports working 15 min daily. Awaiting evaluation for knee replacement. Also has chronic back pain and currently physical therapy. Reports significant fatigue since covid.  09/22/21 Since our last visit she reports she has been sleeping in upright/reclined position with improvement in breathing. Has not tried any inhalers. Currently not having cough or wheezing.  09/27/22 Since our last visit she had a right TKA in May and then presented to the ED for for shortness of breath cough and wheezing. Found to be hypoxemic requiring 2L O2. She was treated with nebulizer including Pulmicort  and lasix  and steroids. Weaned off oxygen. Since discharge she reports feeling weak. She tries to walk 6 rounds around the house. She is having shortness of breath and wheezing. Using albuterol  nebulizer three times a day. But son who provides additional history states she is not active at all. Family med doctor  has started prednisone  taper.  05/10/23 Son present. Since our last visit she reports malaise, cough, congestion for the last week. Had been performing breathing treatment at home for wheezing but also had treatment when she presented to the ED yesterday. CXR yesterday 05/09/23 neg for infiltrate  Of note she has been working with Cardiology for her afib and has had multiple ED visits related to this. HR is now control with her current medications.  07/05/23 Since our last visit she trialed Trelegy with some improvement. Acute bronchitis symptoms resolved.  Denies shortness of breath, cough, wheezing.  02/19/24 Since our last visit she was seen at [facility] on 01/13/24 for acute bronchitis and treated with prednisone  and cough syrup. Did not tolerate prednisone  due to blood pressure. Otherwise from a breathing standpoint doing well. Has replaced right shoulder and knee this year. Denies shortness of breath, cough or wheezing. Rarely used albuterol  except when ill.  Social History: Second hand smoke exposure Wood burning stove  Past Medical History:  Diagnosis Date   (HFpEF) heart failure with preserved ejection fraction (HCC)    a. 09/2020 Echo: EF 55-60%, no rwma, Gr1DD, nl RV size/fxn. RVSP 48.37mmHg. Mild LAE. Mild MR.   Allergy    Arthritis    Blood transfusion    Cancer [facility])    Colitis    Colon polyp    Depression    Diverticulosis    Heart murmur    History of cardiac catheterization    a. 05/2016 Cath ([place]): Nl cors. EF 65%. LVEDP .   History of stress test  a. 11/2018 MV: EF 55-65%, no ischemia/infarct.   Hyperlipidemia    Hypertension    Mitral valve prolapse    a. 09/2020 Echo: Mild MR.   Osteoporosis    Palpitations    a. 07/2020 reported tachycardia-->08/2020 Zio: RSR 78 (49-143). Rare PACs/PVCs. 3 atrial runs up to 10 beats, max 162 bpm. Triggered events = sinus rhythm.   Thyroid  disease      Family History  Problem Relation Age of Onset   Heart disease Mother     Heart disease Other        aunt   Diabetes Other        grandmother   Breast cancer Other        aunt   Ovarian cancer Other        cousin   Rheum arthritis Father    Colon cancer Neg Hx      Social History   Occupational History   Occupation: retired  Tobacco Use   Smoking status: Never   Smokeless tobacco: Never  Vaping Use   Vaping status: Never Used  Substance and Sexual Activity   Alcohol use: No   Drug use: No   Sexual activity: Not on file    Allergies  Allergen Reactions   Losartan Swelling    Facial swelling   Amlodipine  Swelling    Tolerates 2.5mg .    Cholestyramine Other (See Comments)   Cortisone Other (See Comments)     High blood pressure    Prednisone  Other (See Comments)    High blood pressure   Tramadol     Other reaction(s): Other (See Comments), Unknown Panic attacks with other meds that she is taking     Lisinopril Cough     Outpatient Medications Prior to Visit  Medication Sig Dispense Refill   albuterol  (VENTOLIN  HFA) 108 (90 Base) MCG/ACT inhaler Inhale 2 puffs into the lungs every 6 (six) hours as needed for wheezing or shortness of breath. 1 each 0   amiodarone  (PACERONE ) 100 MG tablet Take 1 tablet (100 mg) once daily Monday through Friday 90 tablet 3   amLODipine  (NORVASC ) 5 MG tablet Take 1 tablet (5 mg total) by mouth at bedtime. 90 tablet 3   diltiazem  (CARDIZEM  CD) 120 MG 24 hr capsule TAKE 1 CAPSULE(120 MG) BY MOUTH DAILY 90 capsule 3   diltiazem  (CARDIZEM ) 30 MG tablet Take 1 tablet (30 mg total) by mouth 4 (four) times daily as needed (for tachycardia and palpitations that do not improve with regular meds, fluids, and rest). 30 tablet 0   DULoxetine  (CYMBALTA ) 20 MG capsule Take 20 mg by mouth 2 (two) times daily.     fluticasone  (FLONASE ) 50 MCG/ACT nasal spray Place 1 spray into both nostrils 2 (two) times daily as needed for rhinitis. 17 mL 0   furosemide  (LASIX ) 20 MG tablet Take 1 tablet (20 mg total) by mouth daily as  needed. 30 tablet 3   hydrALAZINE  (APRESOLINE ) 25 MG tablet Take 1 tablet (25 mg total) by mouth at bedtime. 90 tablet 3   ipratropium-albuterol  (DUONEB) 0.5-2.5 (3) MG/3ML SOLN Take 3 mLs by nebulization 3 (three) times daily. (Patient taking differently: Take 3 mLs by nebulization as needed.) 360 mL 0   levothyroxine  (SYNTHROID , LEVOTHROID) 25 MCG tablet Take 62.5 mcg by mouth daily.     mirtazapine (REMERON) 7.5 MG tablet Take 7.5 mg by mouth at bedtime. (Patient taking differently: Take 15 mg by mouth at bedtime.)     potassium chloride  (KLOR-CON   M) 10 MEQ tablet TAKE 1 TABLET (10 MEQ TOTAL) DAILY AS NEEDED (WHEN YOU TAKE LASIX ) (Patient taking differently: Take 10 mEq by mouth as needed.) 30 tablet 11   mirtazapine (REMERON) 15 MG tablet 15 mg at bedtime. (Patient not taking: Reported on 02/19/2024)     promethazine -dextromethorphan (PROMETHAZINE -DM) 6.25-15 MG/5ML syrup Take 5 mLs by mouth 4 (four) times daily as needed for cough. Do not use and drive - May make drowsy. (Patient not taking: Reported on 02/19/2024) 118 mL 0   venlafaxine (EFFEXOR) 37.5 MG tablet Take 75 mg by mouth 2 (two) times daily with a meal. (Patient not taking: Reported on 02/19/2024)     venlafaxine XR (EFFEXOR-XR) 150 MG 24 hr capsule Take 150 mg by mouth. (Patient not taking: Reported on 02/19/2024)     No facility-administered medications prior to visit.    Review of Systems  Constitutional:  Negative for chills, diaphoresis, fever, malaise/fatigue and weight loss.  HENT:  Negative for congestion.   Respiratory:  Negative for cough, hemoptysis, sputum production, shortness of breath and wheezing.   Cardiovascular:  Negative for chest pain, palpitations and leg swelling.     Objective:   Vitals:   02/19/24 1037  BP: 134/61  Pulse: 62  SpO2: 94%  Weight: 157 lb 8 oz (71.4 kg)  Height: 5' 1 (1.549 m)    SpO2: 94 %  Physical Exam: General: Well-appearing, no acute distress HENT: [place], AT Eyes: EOMI, no  scleral icterus Respiratory: Clear to auscultation bilaterally.  No crackles, wheezing or rales Cardiovascular: RRR, -M/R/G, no JVD Extremities:-Edema,-tenderness Neuro: AAO x4, CNII-XII grossly intact Psych: Normal mood, normal affect   Data Reviewed:  Imaging: CT Chest 04/20/18 - stable 5mm RML nodule compared to 2018. Bibasilar atelectasis. DDD s/p kyphoplasty CXR 06/09/21 - Left basilar atelectasis CTA 09/20/22 - No PE. Visualized lung parenchyma with no pulmonary nodules, masses, infiltrate, effusion or pneumothorax. Stable RML nodule compared to 2020. CXR 09/27/22 - No acute infiltrate effusion or edema CXR 06/26/23 - No acute infiltrate, effusion or edema CXR 01/13/24 - No acute infiltrate, effusion or edema  PFT: 09/22/21 FVC 1.15 (63%) FEV1 0.93 (70%) Ratio 85. No significant bronchodilator response however does not preclude benefit of therapy. Interpretation: No obstructive defect. Reduced FVC and FEV1 suggestive of poor effort vs restrictive defect.  Labs: CBC    Component Value Date/Time   WBC 6.8 10/25/2023 1606   RBC 4.39 10/25/2023 1606   HGB 12.6 10/25/2023 1606   HGB 12.9 06/21/2023 1550   HCT 38.9 10/25/2023 1606   HCT 39.9 06/21/2023 1550   PLT 338 10/25/2023 1606   PLT 369 06/21/2023 1550   MCV 88.6 10/25/2023 1606   MCV 87 06/21/2023 1550   MCH 28.7 10/25/2023 1606   MCHC 32.4 10/25/2023 1606   RDW 14.2 10/25/2023 1606   RDW 14.5 06/21/2023 1550   LYMPHSABS 1.1 07/11/2023 0914   MONOABS 0.8 07/11/2023 0914   EOSABS 0.2 07/11/2023 0914   BASOSABS 0.1 07/11/2023 0914      Assessment & Plan:   Discussion: 89 year old female never smoker with chronic bronchitis, SVT, mitral valve prolapse, OA s/p right TKA, chronic diastolic heart fialure, depression, HLD, HTN who presents for follow-up. Stable symptoms. Last exacerbation in fall 2025 which has resolved. Off maintenance bronchodilators and SABA and currently asymptomatic. If she develops symptoms of  pneumonia/bronchitis post-op, low threshold to treat.  Chronic bronchitis Deconditioning, post-op and recent infection --CONTINUE Albuterol  spray or nebulizer AS NEEDED for wheezing  or shortness of breath  Health Maintenance Immunization History  Administered Date(s) Administered   INFLUENZA, HIGH DOSE SEASONAL PF 12/26/2016, 12/27/2017, 12/23/2019, 01/29/2020, 01/07/2021   Influenza Split 12/31/2014, 01/10/2017   Influenza, Seasonal, Injecte, Preservative Fre 12/31/2014   Influenza,inj,Quad PF,6+ Mos 01/11/2016, 12/25/2018   Influenza,inj,Quad PF,6-35 Mos 01/11/2016   Influenza-Unspecified 12/31/2014, 12/23/2019   Moderna Covid-19 Fall Seasonal Vaccine 71yrs & older 12/07/2021   PFIZER Comirnaty(Gray Top)Covid-19 Tri-Sucrose Vaccine 03/25/2019, 04/15/2019, 11/28/2019, 01/29/2020, 06/11/2020   PFIZER(Purple Top)SARS-COV-2 Vaccination 11/07/2019, 01/29/2020   Pneumococcal Conjugate-13 07/10/2013   Pneumococcal Polysaccharide-23 03/14/2006, 01/29/2020   Unspecified SARS-COV-2 Vaccination 01/29/2020   Zoster Recombinant(Shingrix) 08/25/2017, 10/11/2017, 01/30/2018   Zoster, Live 03/14/2009    No orders of the defined types were placed in this encounter.  Meds ordered this encounter  Medications   albuterol  (ACCUNEB ) 0.63 MG/3ML nebulizer solution    Sig: Take 3 mLs (0.63 mg total) by nebulization every 6 (six) hours as needed for wheezing.    Dispense:  120 mL    Refill:  1    Return in about 9 months (around 11/19/2024).   I have spent a total time of 20-minutes on the day of the appointment including chart review, data review, collecting history, coordinating care and discussing medical diagnosis and plan with the patient/family. Past medical history, allergies, medications were reviewed. Pertinent imaging, labs and tests included in this note have been reviewed and interpreted independently by me.  Adarryl Goldammer Slater Staff, MD [facility] 02/19/2024 11:21 AM

## 2024-03-27 ENCOUNTER — Telehealth: Payer: Self-pay | Admitting: Internal Medicine

## 2024-03-27 NOTE — Telephone Encounter (Signed)
 Called back son with medication recommendations from Dr End, patient son verbalized understanding. No further needs at this time. Will plan on following up on blood pressure next Wednesday at appointment.

## 2024-03-27 NOTE — Telephone Encounter (Signed)
 I recommend that Ms. Rebecca Stanley increase her hydralazine  to 50 mg TID and follow-up with me or an APP next week to reassess her BP.  She should be extra careful to minimize her sodium intake.  Thanks.  Lonni Hanson, MD [facility]

## 2024-03-27 NOTE — Telephone Encounter (Signed)
 Spoke with patient son. Patient blood pressure has been running high last couple of weeks with no improvement with self-increased medications. It was 184 systolic Saturday night and she has been symptomatic with hypertension. Son has been giving extra hydralazine . She has been taking everything else as prescribed, she has not needed to take the PRN diltiazem  30 mg because she has not been experiencing any troubles with heart rate, only blood pressure. Recently her PCP has taken her off effexor and put her back on cymbalta .   Yesterday she was given 125 mg of hydralazine  per patient son with little help on blood pressure, every 3 hours he stated. The lowest her blood pressure got was in the 170's. I will forward this over to Dr End and Cadence Franchester for further review and recommendations.   Appt made for patient next week 1/21 with Dr End.

## 2024-03-27 NOTE — Telephone Encounter (Signed)
 Pt c/o BP issue: STAT if pt c/o blurred vision, one-sided weakness or slurred speech  1. What are your last 5 BP readings? 160s-170s/60s-70s  2. Are you having any other symptoms (ex. Dizziness, headache, blurred vision, passed out)?    3. What is your BP issue? Running high  Asking if they can go up in medication dosage. Please advise

## 2024-04-03 ENCOUNTER — Encounter: Payer: Self-pay | Admitting: Internal Medicine

## 2024-04-03 ENCOUNTER — Ambulatory Visit: Admitting: Internal Medicine

## 2024-04-03 VITALS — BP 148/52 | HR 60 | Ht 60.0 in | Wt 161.8 lb

## 2024-04-03 DIAGNOSIS — N3000 Acute cystitis without hematuria: Secondary | ICD-10-CM | POA: Insufficient documentation

## 2024-04-03 DIAGNOSIS — R600 Localized edema: Secondary | ICD-10-CM | POA: Insufficient documentation

## 2024-04-03 DIAGNOSIS — R0989 Other specified symptoms and signs involving the circulatory and respiratory systems: Secondary | ICD-10-CM | POA: Diagnosis not present

## 2024-04-03 DIAGNOSIS — I5032 Chronic diastolic (congestive) heart failure: Secondary | ICD-10-CM

## 2024-04-03 DIAGNOSIS — I471 Supraventricular tachycardia, unspecified: Secondary | ICD-10-CM | POA: Insufficient documentation

## 2024-04-03 MED ORDER — AMLODIPINE BESYLATE 5 MG PO TABS: 5.0000 mg | ORAL_TABLET | Freq: Every evening | ORAL | 3 refills | Status: AC

## 2024-04-03 MED ORDER — HYDRALAZINE HCL 50 MG PO TABS: 50.0000 mg | ORAL_TABLET | Freq: Three times a day (TID) | ORAL | 3 refills | Status: DC

## 2024-04-03 MED ORDER — POTASSIUM CHLORIDE CRYS ER 10 MEQ PO TBCR: 10.0000 meq | EXTENDED_RELEASE_TABLET | Freq: Two times a day (BID) | ORAL | 11 refills | Status: DC | PRN

## 2024-04-03 MED ORDER — FUROSEMIDE 20 MG PO TABS: 20.0000 mg | ORAL_TABLET | Freq: Every day | ORAL | 3 refills | Status: DC | PRN

## 2024-04-03 NOTE — Progress Notes (Unsigned)
 " Cardiology Office Note:  .   Date:  04/04/2024  ID:  Rebecca Stanley, DOB 04-Dec-1930, MRN [id number] PCP: Thurmond Cathlyn LABOR., MD   HeartCare Providers Cardiologist:  Lonni Hanson, MD Electrophysiologist:  OLE ONEIDA HOLTS, MD (Inactive)     History of Present Illness: .   Rebecca Stanley is a 89 y.o. female with history of chronic HFpEF, PSVT, hypertension, hyperlipidemia, MGUS under surveillance at [facility], anxiety/depression, and chronic back pain, who presents for urgent evaluation of rising blood pressures.  She was last seen in our office in July by Mikey Fishman, [place], at which time she was feeling a bit better after her right shoulder surgery without chest pain or shortness of breath.  Her blood pressure also seem to be doing better following her shoulder surgery.  No medication changes are additional testing were pursued.  She saw Dr. Holts in October for follow-up of her PSVT she was continued on amiodarone  100 mg daily.  Today, Rebecca Stanley and her son are concerned about a couple of things.  For 1, her blood pressure has been running a bit high over the last few weeks.  She has now been taking hydralazine  50 mg around noon and again in the early evening hours, followed by 25 mg at bedtime.  She remains on amlodipine  and diltiazem  as well.  Her psychiatrist at [facility] recently suggested that her predominantly systolic hypertension could be due to anxiety.  She also noted dysuria yesterday morning with UA drawn at [facility] suggestive of UTI.  She is waiting to hear back from her medical team at [facility] regarding further recommendations.  She has not had any chest pain, shortness of breath, or palpitations.  She has only needed her as needed furosemide  infrequently, though she and her son note increasing lower extremity edema.  Her weight has been trending up.  They have been trying to watch their sodium intake but endorses eating out quite a bit.  ROS: See HPI  Studies Reviewed: SABRA   EKG  Interpretation Date/Time:  Wednesday April 03 2024 13:47:03 EST Ventricular Rate:  60 PR Interval:  154 QRS Duration:  102 QT Interval:  472 QTC Calculation: 472 R Axis:   66  Text Interpretation: Normal sinus rhythm Incomplete right bundle branch block Abnormal ECG When compared with ECG of 25-Oct-2023 16:02, No significant change was found Confirmed by Cniyah Sproull ([id number]) on 04/04/2024 3:26:53 PM    Risk Assessment/Calculations:     HYPERTENSION CONTROL Vitals:   04/03/24 1341 04/03/24 1415  BP: (!) 150/60 (!) 148/52    The patient's blood pressure is elevated above target today.  In order to address the patient's elevated BP: A current anti-hypertensive medication was adjusted today.          Physical Exam:   VS:  BP (!) 148/52 (BP Location: Left Arm, Patient Position: Sitting, Cuff Size: Normal)   Pulse 60 Comment: 64 oximeter  Ht 5' (1.524 m)   Wt 161 lb 12.8 oz (73.4 kg)   SpO2 95%   BMI 31.60 kg/m    Wt Readings from Last 3 Encounters:  04/04/24 161 lb 13.1 oz (73.4 kg)  04/03/24 161 lb 12.8 oz (73.4 kg)  02/19/24 157 lb 8 oz (71.4 kg)    General:  NAD. Neck: No JVD or HJR. Lungs: Clear to auscultation bilaterally without wheezes or crackles. Heart: Regular rate and rhythm with 2/6 systolic murmur. Abdomen: Soft, nontender, nondistended. Extremities: 1+ pretibial edema bilaterally.  ASSESSMENT AND PLAN: .  Hypertension: Rebecca Stanley has a history of labile hypertension in the past.  I have recommended taking hydralazine  50 mg every 8 hours as well as using her as needed furosemide  for the next few days, as she appears edematous in her legs with increase in weight.  Hopefully these 2 measures will help return her blood pressure to baseline.  We will continue her current doses of amlodipine  and diltiazem .  PSVT: No palpitations reported.  Continue diltiazem  and amiodarone  at the direction of electrophysiology.  UTI: Rebecca Stanley reports recent dysuria  with abnormal UA noted at [facility] yesterday.  She should follow-up with her providers at [facility] regarding ongoing management.  Lower extremity edema: I suspect some of this is due to her lymphedema though recent weight gain suggest that she may have an element of fluid retention as well.  She has not been using her as needed furosemide  much; I have encouraged her to use this for the next few days on a daily basis until her edema and weight returned to baseline.  She should continue using compression stockings and elevating her legs when possible.    Dispo: Return to clinic in 6 weeks.  Signed, Lonni Hanson, MD  "

## 2024-04-03 NOTE — Patient Instructions (Signed)
 Medication Instructions:  Your physician recommends the following medication changes.  INCREASE: Lasix  to 20 mg by mouth daily until swelling resolve, then decrease back to daily as needed  Hydralazine  50 mg by mouth every eight hours   *If you need a refill on your cardiac medications before your next appointment, please call your pharmacy*  Lab Work: No labs ordered today    Testing/Procedures: No test ordered today   Follow-Up: At [facility], you and your health needs are our priority.  As part of our continuing mission to provide you with exceptional heart care, our providers are all part of one team.  This team includes your primary Cardiologist (physician) and Advanced Practice Providers or APPs (Physician Assistants and Nurse Practitioners) who all work together to provide you with the care you need, when you need it.  Your next appointment:   6 week(s)  Provider:   You may see Lonni Hanson, MD or one of the following Advanced Practice Providers on your designated Care Team:   Lonni Meager, NP Lesley Maffucci, PA-C Bernardino Bring, PA-C Cadence [place], PA-C Tylene Lunch, NP Barnie Hila, NP

## 2024-04-04 ENCOUNTER — Encounter (HOSPITAL_BASED_OUTPATIENT_CLINIC_OR_DEPARTMENT_OTHER): Payer: Self-pay | Admitting: Emergency Medicine

## 2024-04-04 ENCOUNTER — Other Ambulatory Visit: Payer: Self-pay

## 2024-04-04 ENCOUNTER — Encounter: Payer: Self-pay | Admitting: Internal Medicine

## 2024-04-04 ENCOUNTER — Emergency Department (HOSPITAL_BASED_OUTPATIENT_CLINIC_OR_DEPARTMENT_OTHER)

## 2024-04-04 ENCOUNTER — Emergency Department (HOSPITAL_BASED_OUTPATIENT_CLINIC_OR_DEPARTMENT_OTHER)
Admission: EM | Admit: 2024-04-04 | Discharge: 2024-04-04 | Disposition: A | Discharge status: Home / Self Care | Attending: Emergency Medicine | Admitting: Emergency Medicine

## 2024-04-04 DIAGNOSIS — R0989 Other specified symptoms and signs involving the circulatory and respiratory systems: Secondary | ICD-10-CM | POA: Insufficient documentation

## 2024-04-04 DIAGNOSIS — F0390 Unspecified dementia without behavioral disturbance: Secondary | ICD-10-CM | POA: Insufficient documentation

## 2024-04-04 DIAGNOSIS — R1310 Dysphagia, unspecified: Secondary | ICD-10-CM | POA: Insufficient documentation

## 2024-04-04 LAB — URINALYSIS, ROUTINE W REFLEX MICROSCOPIC
Bilirubin Urine: NEGATIVE
Glucose, UA: NEGATIVE mg/dL
Hgb urine dipstick: NEGATIVE
Ketones, ur: NEGATIVE mg/dL
Leukocytes,Ua: NEGATIVE
Nitrite: NEGATIVE
Protein, ur: NEGATIVE mg/dL
Specific Gravity, Urine: 1.01 (ref 1.005–1.030)
pH: 6 (ref 5.0–8.0)

## 2024-04-04 NOTE — ED Provider Notes (Signed)
 " Rebecca Stanley EMERGENCY DEPARTMENT AT [facility] Provider Note   CSN: [id number] Arrival date & time: 04/04/24  0541     Patient presents with: Swallowed Foreign Body   Rebecca Stanley is a 89 y.o. female.   Patient is a 89 year old female with history of dementia, hyperlipidemia, mitral valve prolapse.  Patient presenting today with complaints of possible swallowed foreign body.  She was having gagging and her son believes she may have swallowed the cap from a tube of Chapstick.  Patient has little additional history secondary to dementia, but did exhibit some gagging while in triage.       Prior to Admission medications  Medication Sig Start Date End Date Taking? Authorizing Provider  albuterol  (ACCUNEB ) 0.63 MG/3ML nebulizer solution Take 3 mLs (0.63 mg total) by nebulization every 6 (six) hours as needed for wheezing. 02/19/24   Kassie Acquanetta Bradley, MD  albuterol  (VENTOLIN  HFA) [phone] Base) MCG/ACT inhaler Inhale 2 puffs into the lungs every 6 (six) hours as needed for wheezing or shortness of breath. 01/13/24   Ival Domino, FNP  amiodarone  (PACERONE ) 100 MG tablet Take 1 tablet (100 mg) once daily Monday through Friday 12/27/23   Cindie Ole DASEN, MD  amLODipine  (NORVASC ) 5 MG tablet Take 1 tablet (5 mg total) by mouth at bedtime. 04/03/24 07/02/24  End, Lonni, MD  diltiazem  (CARDIZEM  CD) 120 MG 24 hr capsule TAKE 1 CAPSULE(120 MG) BY MOUTH DAILY 01/31/24   Cindie Ole DASEN, MD  diltiazem  (CARDIZEM ) 30 MG tablet Take 1 tablet (30 mg total) by mouth 4 (four) times daily as needed (for tachycardia and palpitations that do not improve with regular meds, fluids, and rest). 12/16/22   Tegeler, Lonni PARAS, MD  DULoxetine  (CYMBALTA ) 20 MG capsule Take 20 mg by mouth 2 (two) times daily. 02/01/24 01/31/25  [provider]  fluticasone  (FLONASE ) 50 MCG/ACT nasal spray Place 1 spray into both nostrils 2 (two) times daily as needed for rhinitis. Patient not taking:  Reported on 04/03/2024 10/06/23 02/19/24  Ival Domino, FNP  furosemide  (LASIX ) 20 MG tablet Take 1 tablet (20 mg total) by mouth daily as needed. 04/03/24 07/02/24  End, Lonni, MD  hydrALAZINE  (APRESOLINE ) 50 MG tablet Take 1 tablet (50 mg total) by mouth every 8 (eight) hours. 04/03/24   End, Lonni, MD  ipratropium-albuterol  (DUONEB) 0.5-2.5 (3) MG/3ML SOLN Take 3 mLs by nebulization 3 (three) times daily. Patient taking differently: Take 3 mLs by nebulization as needed. 09/23/22   Regalado, Belkys A, MD  levothyroxine  (SYNTHROID , LEVOTHROID) 25 MCG tablet Take 62.5 mcg by mouth daily.    [provider]  mirtazapine (REMERON) 15 MG tablet 15 mg at bedtime.    [provider]  potassium chloride  (KLOR-CON  M) 10 MEQ tablet Take 1 tablet (10 mEq total) by mouth 2 (two) times daily as needed (When you take lasix ). 04/03/24   End, Lonni, MD  promethazine -dextromethorphan (PROMETHAZINE -DM) 6.25-15 MG/5ML syrup Take 5 mLs by mouth 4 (four) times daily as needed for cough. Do not use and drive - May make drowsy. 01/13/24   Ival Domino, FNP  Vibegron (GEMTESA) 75 MG TABS Take 75 mg by mouth daily.    [provider]    Allergies: Losartan, Amlodipine , Cholestyramine, Cortisone, Prednisone , Tramadol, and Lisinopril    Review of Systems  All other systems reviewed and are negative.   Updated Vital Signs BP (!) 143/53 (BP Location: Left Arm)   Pulse 65   Resp 16   Ht  5' (1.524 m)   Wt 73.4 kg   SpO2 93%   BMI 31.60 kg/m   Physical Exam Vitals and nursing note reviewed.  Constitutional:      General: She is not in acute distress.    Appearance: She is well-developed. She is not diaphoretic.  HENT:     Head: Normocephalic and atraumatic.     Mouth/Throat:     Mouth: Mucous membranes are moist.     Pharynx: No oropharyngeal exudate or posterior oropharyngeal erythema.  Cardiovascular:     Rate and Rhythm: Normal rate and regular rhythm.     Heart  sounds: No murmur heard.    No friction rub. No gallop.  Pulmonary:     Effort: Pulmonary effort is normal. No respiratory distress.     Breath sounds: Normal breath sounds. No wheezing.  Abdominal:     General: Bowel sounds are normal. There is no distension.     Palpations: Abdomen is soft.     Tenderness: There is no abdominal tenderness.  Musculoskeletal:        General: Normal range of motion.     Cervical back: Normal range of motion and neck supple.  Skin:    General: Skin is warm and dry.  Neurological:     General: No focal deficit present.     Mental Status: She is alert and oriented to person, place, and time.     (all labs ordered are listed, but only abnormal results are displayed) Labs Reviewed - No data to display  EKG: None  Radiology: No results found.   Procedures   Medications Ordered in the ED - No data to display                                  Medical Decision Making Amount and/or Complexity of Data Reviewed Labs: ordered. Radiology: ordered.   Patient brought by son after possibly ingesting the cap to a tube of Chapstick.  She was found to be coughing and gagging with the cap of the Chapstick missing.  I have ordered soft tissue films of the neck and chest to see if a foreign body can be identified.  Care signed out to Dr. Ruthe at shift change.  He will obtain the results of these x-rays and determine the appropriate course of action.     Final diagnoses:  None    ED Discharge Orders     None          Geroldine Berg, MD 04/10/24 0245  "

## 2024-04-04 NOTE — ED Provider Notes (Signed)
 Urinalysis negative for infection.  X-ray showed no foreign body.  Patient has dementia.  I talked with family member at the bedside.  She woke up thinking she swallowed something.  He was not sure if it was a cough drop that she had because her tongue was yellow the color of her coughed up that she has been taking.  But he noticed that Chapstick nearby did not have a cap on it.  He is not really confident if she possibly swallowed this.  She cannot really give major history.  Fortunately Chapstick Is about a centimeter wide.  My hope is that if she did swallow this accidentally this would likely pass without any issues.  However were fortunate that we do not see any metallic foreign body on x-rays.  We watched her here for several hours and she did very well.  Is not having any breathing issues.  She has no symptoms on my exam.  She was able to drink juice and eat several crackers without any issues here.  Ultimately we gave very strict return precautions.  I recommended that if she started to have food impaction symptoms/inability to tolerate fluids or food and was regurgitating things that she should come back for likely endoscopy.  My hope is that if this was a plastic This will pass.  He understands to return if she develops cough fever respiratory symptoms severe abdominal pain or any other concerning symptoms.  They were comfortable with watchful waiting at home and patient was discharged in good condition.  This chart was dictated using voice recognition software.  Despite best efforts to proofread,  errors can occur which can change the documentation meaning.    Ruthe Cornet, DO 04/04/24 [phone]

## 2024-04-04 NOTE — ED Triage Notes (Signed)
 Per pt son, pt called out she swallowed something. Son unable to find cap to  chapstick tube. Pt making gaging/throat clearing noises in triage.

## 2024-04-04 NOTE — ED Notes (Signed)
 Pt given grape juice and graham crackers for PO challenge, per Dr. Ruthe.

## 2024-04-04 NOTE — ED Notes (Signed)
 Patient transported to X-ray

## 2024-04-04 NOTE — Discharge Instructions (Signed)
 Please return if symptoms worsen as we discussed.  If she starts to develop regurgitation, not able to tolerate fluids or foods or feels like something gets stuck in the middle of her chest come back for evaluation including if she develops cough respiratory symptoms fever severe abdominal pain or any other concerning symptoms.  My hope is that if she did swallow plastic This should pass with out any problems but have short leash to return if she develops any symptoms as we discussed.

## 2024-04-08 ENCOUNTER — Ambulatory Visit (HOSPITAL_BASED_OUTPATIENT_CLINIC_OR_DEPARTMENT_OTHER)
Admission: RE | Admit: 2024-04-08 | Discharge: 2024-04-08 | Disposition: A | Discharge status: Home / Self Care | Source: Ambulatory Visit

## 2024-04-08 ENCOUNTER — Other Ambulatory Visit (HOSPITAL_BASED_OUTPATIENT_CLINIC_OR_DEPARTMENT_OTHER): Payer: Self-pay

## 2024-04-08 ENCOUNTER — Ambulatory Visit (HOSPITAL_BASED_OUTPATIENT_CLINIC_OR_DEPARTMENT_OTHER): Admit: 2024-04-08 | Discharge: 2024-04-08 | Disposition: A | Discharge status: Home / Self Care | Admitting: Radiology

## 2024-04-08 ENCOUNTER — Encounter (HOSPITAL_BASED_OUTPATIENT_CLINIC_OR_DEPARTMENT_OTHER): Payer: Self-pay

## 2024-04-08 VITALS — BP 149/69 | HR 71 | Temp 98.4°F | Resp 18

## 2024-04-08 DIAGNOSIS — J9811 Atelectasis: Secondary | ICD-10-CM

## 2024-04-08 DIAGNOSIS — J189 Pneumonia, unspecified organism: Secondary | ICD-10-CM | POA: Diagnosis not present

## 2024-04-08 DIAGNOSIS — R051 Acute cough: Secondary | ICD-10-CM

## 2024-04-08 MED ORDER — DOXYCYCLINE HYCLATE 100 MG PO CAPS
100.0000 mg | ORAL_CAPSULE | Freq: Two times a day (BID) | ORAL | 0 refills | Status: DC
  Filled 2024-04-08: qty 20, 10d supply, fill #0

## 2024-04-08 NOTE — Discharge Instructions (Addendum)
 Bronchitis versus early pneumonia with atelectasis and cough: X-ray does not show consolidation but does show some atelectasis which could be a sign of early pneumonia.  Encouraged to complete the nitrofurantoin.  Doxycycline  100 mg twice daily for 10 days for possible pneumonia.  Get plenty of fluids and rest.  Continue nebulizers at home as needed.  Due to use of both nitrofurantoin and doxycycline , encouraged probiotics to reduce risk of antibiotic related diarrhea.  Make an appoint with primary care for recheck in 2 to 3 weeks to make sure chest congestion has resolved.  Follow-up with primary care or return here if symptoms do not improve, worsen or new symptoms occur.

## 2024-04-08 NOTE — ED Triage Notes (Signed)
 Pt had congestion and doesn't fell good, and fatigue for a couple days. Using inhaler and doing breathing treatment. Has cough. Currently on antibiotics for UTI. Family concerned about PNA

## 2024-04-08 NOTE — ED Provider Notes (Signed)
 " [facility]    CSN: [id number] Arrival date & time: 04/08/24  1456      History   Chief Complaint Chief Complaint  Patient presents with   Appointment    HPI Rebecca Stanley is a 89 y.o. female.   89 year old female here with her son.  He reports that she has had cough and chest congestion since 04/04/2024.  Her son is concerned she might have pneumonia.  She is using her inhaler and she has home nebulizer with albuterol .  She is currently on antibiotics for an acute UTI.  Her fingernails are painted so it is hard to assess her oxygen saturation.     Past Medical History:  Diagnosis Date   (HFpEF) heart failure with preserved ejection fraction (HCC)    a. 09/2020 Echo: EF 55-60%, no rwma, Gr1DD, nl RV size/fxn. RVSP 48.74mmHg. Mild LAE. Mild MR.   Allergy    Arthritis    Blood transfusion    Cancer [facility])    Colitis    Colon polyp    Depression    Diverticulosis    Heart murmur    History of cardiac catheterization    a. 05/2016 Cath ([place]): Nl cors. EF 65%. LVEDP .   History of stress test    a. 11/2018 MV: EF 55-65%, no ischemia/infarct.   Hyperlipidemia    Hypertension    Mitral valve prolapse    a. 09/2020 Echo: Mild MR.   Osteoporosis    Palpitations    a. 07/2020 reported tachycardia-->08/2020 Zio: RSR 78 (49-143). Rare PACs/PVCs. 3 atrial runs up to 10 beats, max 162 bpm. Triggered events = sinus rhythm.   Thyroid  disease     Patient Active Problem List   Diagnosis Date Noted   Labile hypertension 04/04/2024   Paroxysmal SVT (supraventricular tachycardia) 04/08/2023   Acute on chronic diastolic CHF (congestive heart failure) (HCC) 09/21/2022   Peripheral neuropathy 09/21/2022   Hypothyroidism 09/21/2022   Acute bronchitis with bronchospasm 09/21/2022   Acute respiratory failure with hypoxia (HCC) 09/20/2022   Syncope 01/20/2021   Palpitations 08/07/2020   Leg edema 12/27/2018   Chronic fatigue 08/18/2017   Uncontrolled hypertension  07/11/2017   Shortness of breath 08/16/2010   Mitral valve prolapse    Mixed hyperlipidemia    DIARRHEA 04/05/2010   Diverticulosis of colon 04/02/2010   CONSTIPATION, CHRONIC 04/02/2010   History of colonic polyps 04/02/2010    Past Surgical History:  Procedure Laterality Date   BELPHAROPTOSIS REPAIR     bladder tack     CATARACT EXTRACTION     COLONOSCOPY     DILATION AND CURETTAGE, DIAGNOSTIC / THERAPEUTIC     gluteous medius tendon rupture     done at [facility]   heel tendon repair     HIP ARTHROSCOPY W/ LABRAL REPAIR     kyploplasty     T12, L1, L2, L3, done at [facility]   PARTIAL HYSTERECTOMY      OB History   No obstetric history on file.      Home Medications    Prior to Admission medications  Medication Sig Start Date End Date Taking? Authorizing Provider  doxycycline  (VIBRAMYCIN ) 100 MG capsule Take 1 capsule (100 mg total) by mouth 2 (two) times daily for 10 days. 04/08/24 04/18/24 Yes Ival Domino, FNP  albuterol  (ACCUNEB ) 0.63 MG/3ML nebulizer solution Take 3 mLs (0.63 mg total) by nebulization every 6 (six) hours as needed for wheezing. 02/19/24   Kassie Acquanetta Bradley, MD  albuterol  (VENTOLIN  HFA) 108 (90 Base) MCG/ACT inhaler Inhale 2 puffs into the lungs every 6 (six) hours as needed for wheezing or shortness of breath. 01/13/24   Ival Domino, FNP  amiodarone  (PACERONE ) 100 MG tablet Take 1 tablet (100 mg) once daily Monday through Friday 12/27/23   Cindie Ole DASEN, MD  amLODipine  (NORVASC ) 5 MG tablet Take 1 tablet (5 mg total) by mouth at bedtime. 04/03/24 07/02/24  End, Lonni, MD  diltiazem  (CARDIZEM  CD) 120 MG 24 hr capsule TAKE 1 CAPSULE(120 MG) BY MOUTH DAILY 01/31/24   Cindie Ole DASEN, MD  diltiazem  (CARDIZEM ) 30 MG tablet Take 1 tablet (30 mg total) by mouth 4 (four) times daily as needed (for tachycardia and palpitations that do not improve with regular meds, fluids, and rest). 12/16/22   Tegeler, Lonni PARAS, MD  DULoxetine  (CYMBALTA ) 20 MG capsule  Take 20 mg by mouth 2 (two) times daily. 02/01/24 01/31/25  [provider]  furosemide  (LASIX ) 20 MG tablet Take 1 tablet (20 mg total) by mouth daily as needed. 04/03/24 07/02/24  End, Lonni, MD  hydrALAZINE  (APRESOLINE ) 50 MG tablet Take 1 tablet (50 mg total) by mouth every 8 (eight) hours. 04/03/24   End, Lonni, MD  ipratropium-albuterol  (DUONEB) 0.5-2.5 (3) MG/3ML SOLN Take 3 mLs by nebulization 3 (three) times daily. Patient taking differently: Take 3 mLs by nebulization as needed. 09/23/22   Regalado, Belkys A, MD  levothyroxine  (SYNTHROID , LEVOTHROID) 25 MCG tablet Take 62.5 mcg by mouth daily.    [provider]  mirtazapine (REMERON) 15 MG tablet 15 mg at bedtime.    [provider]  nitrofurantoin, macrocrystal-monohydrate, (MACROBID) 100 MG capsule Take 100 mg by mouth 2 (two) times daily.    [provider]  potassium chloride  (KLOR-CON  M) 10 MEQ tablet Take 1 tablet (10 mEq total) by mouth 2 (two) times daily as needed (When you take lasix ). 04/03/24   End, Lonni, MD  promethazine -dextromethorphan (PROMETHAZINE -DM) 6.25-15 MG/5ML syrup Take 5 mLs by mouth 4 (four) times daily as needed for cough. Do not use and drive - May make drowsy. 01/13/24   Ival Domino, FNP  Vibegron (GEMTESA) 75 MG TABS Take 75 mg by mouth daily.    [provider]    Family History Family History  Problem Relation Age of Onset   Heart disease Mother    Heart disease Other        aunt   Diabetes Other        grandmother   Breast cancer Other        aunt   Ovarian cancer Other        cousin   Rheum arthritis Father    Colon cancer Neg Hx     Social History Social History[1]   Allergies   Losartan, Amlodipine , Cholestyramine, Cortisone, Prednisone , Tramadol, and Lisinopril   Review of Systems Review of Systems  Constitutional:  Negative for chills and fever.  HENT:  Positive for congestion, postnasal drip and rhinorrhea. Negative  for ear pain, sinus pressure, sinus pain and sore throat.   Eyes:  Negative for pain and visual disturbance.  Respiratory:  Positive for cough and chest tightness. Negative for shortness of breath.   Cardiovascular:  Negative for chest pain and palpitations.  Gastrointestinal:  Negative for abdominal pain, constipation, diarrhea, nausea and vomiting.  Genitourinary:  Negative for dysuria and hematuria.  Musculoskeletal:  Negative for arthralgias and back pain.  Skin:  Negative for color change and rash.  Neurological:  Negative for seizures and syncope.  All other systems reviewed and are negative.    Physical Exam Triage Vital Signs ED Triage Vitals  Encounter Vitals Group     BP 04/08/24 1517 (!) 149/69     Girls Systolic BP Percentile --      Girls Diastolic BP Percentile --      Boys Systolic BP Percentile --      Boys Diastolic BP Percentile --      Pulse Rate 04/08/24 1517 71     Resp 04/08/24 1517 18     Temp 04/08/24 1517 98.4 F (36.9 C)     Temp Source 04/08/24 1517 Oral     SpO2 04/08/24 1517 91 %     Weight --      Height --      Head Circumference --      Peak Flow --      Pain Score 04/08/24 1515 0     Pain Loc --      Pain Education --      Exclude from Growth Chart --    No data found.  Updated Vital Signs BP (!) 149/69 (BP Location: Left Arm)   Pulse 71   Temp 98.4 F (36.9 C) (Oral)   Resp 18   SpO2 91%   Visual Acuity Right Eye Distance:   Left Eye Distance:   Bilateral Distance:    Right Eye Near:   Left Eye Near:    Bilateral Near:     Physical Exam Vitals and nursing note reviewed.  Constitutional:      General: She is not in acute distress.    Appearance: She is well-developed. She is not ill-appearing, toxic-appearing or diaphoretic.  HENT:     Head: Normocephalic and atraumatic.     Right Ear: Hearing, tympanic membrane, ear canal and external ear normal.     Left Ear: Hearing, tympanic membrane, ear canal and external ear  normal.     Nose: Congestion and rhinorrhea present. Rhinorrhea is clear.     Right Sinus: No maxillary sinus tenderness or frontal sinus tenderness.     Left Sinus: No maxillary sinus tenderness or frontal sinus tenderness.     Mouth/Throat:     Lips: Pink.     Mouth: Mucous membranes are moist.     Pharynx: Uvula midline. No oropharyngeal exudate or posterior oropharyngeal erythema.     Tonsils: No tonsillar exudate.  Eyes:     Conjunctiva/sclera: Conjunctivae normal.     Pupils: Pupils are equal, round, and reactive to light.  Cardiovascular:     Rate and Rhythm: Normal rate and regular rhythm.     Heart sounds: S1 normal and S2 normal. No murmur heard. Pulmonary:     Effort: Pulmonary effort is normal. No respiratory distress.     Breath sounds: Examination of the right-lower field reveals decreased breath sounds. Examination of the left-lower field reveals decreased breath sounds. Decreased breath sounds present. No wheezing, rhonchi or rales.     Comments: Oxygen saturation is 90% on room air but the patient has painted fingernails and is very hard to assess her oxygen saturation with a pulse oximeter.  She has decreased breath sounds on the bases but otherwise her breath sounds are clear without rhonchi or wheezing. Abdominal:     General: Bowel sounds are normal.     Palpations: Abdomen is soft.     Tenderness: There is no abdominal tenderness.  Musculoskeletal:  General: No swelling.     Cervical back: Neck supple.  Lymphadenopathy:     Head:     Right side of head: No submental, submandibular, tonsillar, preauricular or posterior auricular adenopathy.     Left side of head: No submental, submandibular, tonsillar, preauricular or posterior auricular adenopathy.     Cervical: No cervical adenopathy.     Right cervical: No superficial cervical adenopathy.    Left cervical: No superficial cervical adenopathy.  Skin:    General: Skin is warm and dry.     Capillary  Refill: Capillary refill takes less than 2 seconds.     Findings: No rash.  Neurological:     Mental Status: She is alert and oriented to person, place, and time.  Psychiatric:        Mood and Affect: Mood normal.      UC Treatments / Results  Labs (all labs ordered are listed, but only abnormal results are displayed) Labs Reviewed - No data to display  EKG   Radiology DG Chest 2 View Result Date: 04/08/2024 EXAM: 2 VIEW(S) XRAY OF THE CHEST 04/08/2024 03:33:20 PM COMPARISON: 04/04/2024 CLINICAL HISTORY: Cough. FINDINGS: LINES, TUBES AND DEVICES: Spinal cord stimulator partially visualized. Reverse right shoulder arthroplasty in expected location. LUNGS AND PLEURA: Mild bibasilar scarring or atelectasis. No pleural effusion. No pneumothorax. HEART AND MEDIASTINUM: No acute abnormality of the cardiac and mediastinal silhouettes. Aortic atherosclerosis. BONES AND SOFT TISSUES: Vertebral augmentation changes in lumbar spine. IMPRESSION: 1. No acute cardiopulmonary abnormality. 2. Mild bibasilar scarring or atelectasis. Electronically signed by: Elsie Gravely MD 04/08/2024 03:43 PM EST RP Workstation: [id number]    Procedures Procedures (including critical care time)  Medications Ordered in UC Medications - No data to display  Initial Impression / Assessment and Plan / UC Course  I have reviewed the triage vital signs and the nursing notes.  Pertinent labs & imaging results that were available during my care of the patient were reviewed by me and considered in my medical decision making (see chart for details).  Plan of Care (see discharge instructions for additional patient precautions and education): Bronchitis versus early pneumonia with atelectasis and cough: X-ray does not show consolidation but does show some atelectasis which could be a sign of early pneumonia.  Encouraged to complete the nitrofurantoin.  Doxycycline  100 mg twice daily for 10 days for possible pneumonia.  Get  plenty of fluids and rest.  Continue nebulizers at home as needed.  Due to use of both nitrofurantoin and doxycycline , encouraged probiotics to reduce risk of antibiotic related diarrhea.  Make an appoint with primary care for recheck in 2 to 3 weeks to make sure chest congestion has resolved.  Follow-up with primary care or return here if symptoms do not improve, worsen or new symptoms occur.  I reviewed the plan of care with the patient and/or the patient's guardian.  The patient and/or guardian had time to ask questions and acknowledged that the questions were answered.  Final Clinical Impressions(s) / UC Diagnoses   Final diagnoses:  Acute cough  Atelectasis  Community acquired pneumonia, unspecified laterality     Discharge Instructions      Bronchitis versus early pneumonia with atelectasis and cough: X-ray does not show consolidation but does show some atelectasis which could be a sign of early pneumonia.  Encouraged to complete the nitrofurantoin.  Doxycycline  100 mg twice daily for 10 days for possible pneumonia.  Get plenty of fluids and rest.  Continue nebulizers at home as  needed.  Due to use of both nitrofurantoin and doxycycline , encouraged probiotics to reduce risk of antibiotic related diarrhea.  Make an appoint with primary care for recheck in 2 to 3 weeks to make sure chest congestion has resolved.  Follow-up with primary care or return here if symptoms do not improve, worsen or new symptoms occur.     ED Prescriptions     Medication Sig Dispense Auth. Provider   doxycycline  (VIBRAMYCIN ) 100 MG capsule Take 1 capsule (100 mg total) by mouth 2 (two) times daily for 10 days. 20 capsule Ival Domino, FNP      PDMP not reviewed this encounter.    [1]  Social History Tobacco Use   Smoking status: Never   Smokeless tobacco: Never  Vaping Use   Vaping status: Never Used  Substance Use Topics   Alcohol use: No   Drug use: No     Ival Domino, FNP 04/08/24  1551  "

## 2024-04-16 ENCOUNTER — Encounter (HOSPITAL_BASED_OUTPATIENT_CLINIC_OR_DEPARTMENT_OTHER): Payer: Self-pay | Admitting: Emergency Medicine

## 2024-04-16 ENCOUNTER — Other Ambulatory Visit: Payer: Self-pay

## 2024-04-16 ENCOUNTER — Inpatient Hospital Stay (HOSPITAL_BASED_OUTPATIENT_CLINIC_OR_DEPARTMENT_OTHER)
Admission: EM | Admit: 2024-04-16 | Discharge: 2024-04-19 | Disposition: A | Source: Home / Self Care | Attending: Internal Medicine | Admitting: Internal Medicine

## 2024-04-16 DIAGNOSIS — F32A Depression, unspecified: Secondary | ICD-10-CM | POA: Diagnosis present

## 2024-04-16 DIAGNOSIS — E039 Hypothyroidism, unspecified: Secondary | ICD-10-CM | POA: Diagnosis present

## 2024-04-16 DIAGNOSIS — M51369 Other intervertebral disc degeneration, lumbar region without mention of lumbar back pain or lower extremity pain: Secondary | ICD-10-CM | POA: Diagnosis present

## 2024-04-16 DIAGNOSIS — I471 Supraventricular tachycardia, unspecified: Secondary | ICD-10-CM | POA: Diagnosis present

## 2024-04-16 DIAGNOSIS — G629 Polyneuropathy, unspecified: Secondary | ICD-10-CM

## 2024-04-16 DIAGNOSIS — F0390 Unspecified dementia without behavioral disturbance: Secondary | ICD-10-CM | POA: Diagnosis present

## 2024-04-16 DIAGNOSIS — R7303 Prediabetes: Secondary | ICD-10-CM | POA: Diagnosis present

## 2024-04-16 DIAGNOSIS — I10 Essential (primary) hypertension: Secondary | ICD-10-CM | POA: Diagnosis present

## 2024-04-16 DIAGNOSIS — R7989 Other specified abnormal findings of blood chemistry: Secondary | ICD-10-CM | POA: Diagnosis present

## 2024-04-16 DIAGNOSIS — J9601 Acute respiratory failure with hypoxia: Secondary | ICD-10-CM | POA: Diagnosis present

## 2024-04-16 DIAGNOSIS — E782 Mixed hyperlipidemia: Secondary | ICD-10-CM | POA: Diagnosis present

## 2024-04-16 DIAGNOSIS — R0902 Hypoxemia: Secondary | ICD-10-CM

## 2024-04-16 DIAGNOSIS — K5909 Other constipation: Secondary | ICD-10-CM | POA: Diagnosis present

## 2024-04-16 DIAGNOSIS — R531 Weakness: Principal | ICD-10-CM

## 2024-04-16 DIAGNOSIS — E538 Deficiency of other specified B group vitamins: Secondary | ICD-10-CM | POA: Diagnosis present

## 2024-04-16 DIAGNOSIS — I5189 Other ill-defined heart diseases: Secondary | ICD-10-CM

## 2024-04-16 NOTE — ED Provider Notes (Signed)
 " [place] [facility] Provider Note   CSN: [id number] Arrival date & time: 04/16/24  2339     Patient presents with: Weakness   Rebecca Stanley is a 89 y.o. female.  {Add pertinent medical, surgical, social history, OB history to YEP:67052} The history is provided by a relative. The history is limited by the condition of the patient (level 5 caveat dementia).  Weakness      Prior to Admission medications  Medication Sig Start Date End Date Taking? Authorizing Provider  albuterol  (ACCUNEB ) 0.63 MG/3ML nebulizer solution Take 3 mLs (0.63 mg total) by nebulization every 6 (six) hours as needed for wheezing. 02/19/24   Kassie Acquanetta Bradley, MD  albuterol  (VENTOLIN  HFA) 108 (90 Base) MCG/ACT inhaler Inhale 2 puffs into the lungs every 6 (six) hours as needed for wheezing or shortness of breath. 01/13/24   Ival Domino, FNP  amiodarone  (PACERONE ) 100 MG tablet Take 1 tablet (100 mg) once daily Monday through Friday 12/27/23   Cindie Ole DASEN, MD  amLODipine  (NORVASC ) 5 MG tablet Take 1 tablet (5 mg total) by mouth at bedtime. 04/03/24 07/02/24  End, Lonni, MD  diltiazem  (CARDIZEM  CD) 120 MG 24 hr capsule TAKE 1 CAPSULE(120 MG) BY MOUTH DAILY 01/31/24   Cindie Ole DASEN, MD  diltiazem  (CARDIZEM ) 30 MG tablet Take 1 tablet (30 mg total) by mouth 4 (four) times daily as needed (for tachycardia and palpitations that do not improve with regular meds, fluids, and rest). 12/16/22   Tegeler, Lonni PARAS, MD  doxycycline  (VIBRAMYCIN ) 100 MG capsule Take 1 capsule (100 mg total) by mouth 2 (two) times daily for 10 days. 04/08/24 04/18/24  Ival Domino, FNP  DULoxetine  (CYMBALTA ) 20 MG capsule Take 20 mg by mouth 2 (two) times daily. 02/01/24 01/31/25  [provider]  furosemide  (LASIX ) 20 MG tablet Take 1 tablet (20 mg total) by mouth daily as needed. 04/03/24 07/02/24  End, Lonni, MD  hydrALAZINE  (APRESOLINE ) 50 MG tablet Take 1 tablet (50 mg total)  by mouth every 8 (eight) hours. 04/03/24   End, Lonni, MD  ipratropium-albuterol  (DUONEB) 0.5-2.5 (3) MG/3ML SOLN Take 3 mLs by nebulization 3 (three) times daily. Patient taking differently: Take 3 mLs by nebulization as needed. 09/23/22   Regalado, Belkys A, MD  levothyroxine  (SYNTHROID , LEVOTHROID) 25 MCG tablet Take 62.5 mcg by mouth daily.    [provider]  mirtazapine  (REMERON ) 15 MG tablet 15 mg at bedtime.    [provider]  nitrofurantoin, macrocrystal-monohydrate, (MACROBID) 100 MG capsule Take 100 mg by mouth 2 (two) times daily.    [provider]  potassium chloride  (KLOR-CON  M) 10 MEQ tablet Take 1 tablet (10 mEq total) by mouth 2 (two) times daily as needed (When you take lasix ). 04/03/24   End, Lonni, MD  promethazine -dextromethorphan (PROMETHAZINE -DM) 6.25-15 MG/5ML syrup Take 5 mLs by mouth 4 (four) times daily as needed for cough. Do not use and drive - May make drowsy. 01/13/24   Ival Domino, FNP  Vibegron (GEMTESA) 75 MG TABS Take 75 mg by mouth daily.    [provider]    Allergies: Losartan, Amlodipine , Cholestyramine, Cortisone, Prednisone , Tramadol, and Lisinopril    Review of Systems  Neurological:  Positive for weakness.    Updated Vital Signs BP (!) 148/54 (BP Location: Left Arm)   Pulse 64   Temp 98.5 F (36.9 C)   Resp 18   SpO2 95%   Physical Exam  (all labs ordered are  listed, but only abnormal results are displayed) Labs Reviewed  RESP PANEL BY RT-PCR (RSV, FLU A&B, COVID)  RVPGX2  CBC WITH DIFFERENTIAL/PLATELET  COMPREHENSIVE METABOLIC PANEL WITH GFR  URINALYSIS, ROUTINE W REFLEX MICROSCOPIC  TROPONIN T, HIGH SENSITIVITY    EKG: None  Radiology: No results found.  {Document cardiac monitor, telemetry assessment procedure when appropriate:32947} Procedures   Medications Ordered in the ED - No data to display    {Click here for ABCD2, HEART and other calculators REFRESH Note before  signing:1}                              Medical Decision Making Amount and/or Complexity of Data Reviewed Labs: ordered. Radiology: ordered.   ***  {Document critical care time when appropriate  Document review of labs and clinical decision tools ie [id number], etc  Document your independent review of radiology images and any outside records  Document your discussion with family members, caretakers and with consultants  Document social determinants of health affecting pt's care  Document your decision making why or why not admission, treatments were needed:32947:::1}   Final diagnoses:  None    ED Discharge Orders     None        "

## 2024-04-17 ENCOUNTER — Encounter (HOSPITAL_COMMUNITY): Payer: Self-pay | Admitting: Family Medicine

## 2024-04-17 ENCOUNTER — Emergency Department (HOSPITAL_BASED_OUTPATIENT_CLINIC_OR_DEPARTMENT_OTHER)

## 2024-04-17 ENCOUNTER — Observation Stay (HOSPITAL_COMMUNITY)

## 2024-04-17 DIAGNOSIS — J9601 Acute respiratory failure with hypoxia: Secondary | ICD-10-CM

## 2024-04-17 DIAGNOSIS — I5189 Other ill-defined heart diseases: Secondary | ICD-10-CM

## 2024-04-17 DIAGNOSIS — I10 Essential (primary) hypertension: Secondary | ICD-10-CM | POA: Insufficient documentation

## 2024-04-17 DIAGNOSIS — R7989 Other specified abnormal findings of blood chemistry: Secondary | ICD-10-CM | POA: Diagnosis present

## 2024-04-17 DIAGNOSIS — R079 Chest pain, unspecified: Secondary | ICD-10-CM | POA: Diagnosis not present

## 2024-04-17 LAB — ECHOCARDIOGRAM COMPLETE
AR max vel: 1.95 cm2
AV Area VTI: 2.13 cm2
AV Area mean vel: 2.02 cm2
AV Mean grad: 5 mmHg
AV Peak grad: 9.5 mmHg
Ao pk vel: 1.54 m/s
Area-P 1/2: 3.45 cm2
Height: 60 in
S' Lateral: 1.9 cm
Weight: 2645.52 [oz_av]

## 2024-04-17 LAB — CBC WITH DIFFERENTIAL/PLATELET
Abs Immature Granulocytes: 0.08 10*3/uL — ABNORMAL HIGH (ref 0.00–0.07)
Basophils Absolute: 0.1 10*3/uL (ref 0.0–0.1)
Basophils Relative: 2 %
Eosinophils Absolute: 0.2 10*3/uL (ref 0.0–0.5)
Eosinophils Relative: 2 %
HCT: 36.5 % (ref 36.0–46.0)
Hemoglobin: 12 g/dL (ref 12.0–15.0)
Immature Granulocytes: 1 %
Lymphocytes Relative: 21 %
Lymphs Abs: 1.7 10*3/uL (ref 0.7–4.0)
MCH: 29.1 pg (ref 26.0–34.0)
MCHC: 32.9 g/dL (ref 30.0–36.0)
MCV: 88.4 fL (ref 80.0–100.0)
Monocytes Absolute: 1 10*3/uL (ref 0.1–1.0)
Monocytes Relative: 12 %
Neutro Abs: 4.9 10*3/uL (ref 1.7–7.7)
Neutrophils Relative %: 62 %
Platelets: 377 10*3/uL (ref 150–400)
RBC: 4.13 MIL/uL (ref 3.87–5.11)
RDW: 13.8 % (ref 11.5–15.5)
WBC: 8 10*3/uL (ref 4.0–10.5)
nRBC: 0 % (ref 0.0–0.2)

## 2024-04-17 LAB — COMPREHENSIVE METABOLIC PANEL WITH GFR
ALT: 15 U/L (ref 0–44)
AST: 20 U/L (ref 15–41)
Albumin: 4.4 g/dL (ref 3.5–5.0)
Alkaline Phosphatase: 71 U/L (ref 38–126)
Anion gap: 11 (ref 5–15)
BUN: 18 mg/dL (ref 8–23)
CO2: 28 mmol/L (ref 22–32)
Calcium: 9.8 mg/dL (ref 8.9–10.3)
Chloride: 100 mmol/L (ref 98–111)
Creatinine, Ser: 0.89 mg/dL (ref 0.44–1.00)
GFR, Estimated: >60 mL/min
Glucose, Bld: 107 mg/dL — ABNORMAL HIGH (ref 70–99)
Potassium: 3.8 mmol/L (ref 3.5–5.1)
Sodium: 139 mmol/L (ref 135–145)
Total Bilirubin: 0.4 mg/dL (ref 0.0–1.2)
Total Protein: 7.2 g/dL (ref 6.5–8.1)

## 2024-04-17 LAB — URINALYSIS, ROUTINE W REFLEX MICROSCOPIC
Bilirubin Urine: NEGATIVE
Glucose, UA: NEGATIVE mg/dL
Hgb urine dipstick: NEGATIVE
Ketones, ur: NEGATIVE mg/dL
Leukocytes,Ua: NEGATIVE
Nitrite: NEGATIVE
Protein, ur: NEGATIVE mg/dL
Specific Gravity, Urine: 1.01 (ref 1.005–1.030)
pH: 6.5 (ref 5.0–8.0)

## 2024-04-17 LAB — RESP PANEL BY RT-PCR (RSV, FLU A&B, COVID)  RVPGX2
Influenza A by PCR: NEGATIVE
Influenza B by PCR: NEGATIVE
Resp Syncytial Virus by PCR: NEGATIVE
SARS Coronavirus 2 by RT PCR: NEGATIVE

## 2024-04-17 LAB — MAGNESIUM: Magnesium: 2.3 mg/dL (ref 1.7–2.4)

## 2024-04-17 LAB — PROCALCITONIN: Procalcitonin: <0.1 ng/mL

## 2024-04-17 LAB — TROPONIN T, HIGH SENSITIVITY
Troponin T High Sensitivity: 20 ng/L — ABNORMAL HIGH (ref 0–19)
Troponin T High Sensitivity: 22 ng/L — ABNORMAL HIGH (ref 0–19)
Troponin T High Sensitivity: 23 ng/L — ABNORMAL HIGH (ref 0–19)

## 2024-04-17 LAB — PHOSPHORUS: Phosphorus: 3.7 mg/dL (ref 2.5–4.6)

## 2024-04-17 MED ORDER — DILTIAZEM HCL ER COATED BEADS 120 MG PO CP24
120.0000 mg | ORAL_CAPSULE | Freq: Every day | ORAL | Status: DC
  Administered 2024-04-17 – 2024-04-19 (×2): 120 mg via ORAL
  Filled 2024-04-17 (×3): qty 1

## 2024-04-17 MED ORDER — AMLODIPINE BESYLATE 5 MG PO TABS
5.0000 mg | ORAL_TABLET | Freq: Every day | ORAL | Status: DC
  Administered 2024-04-17 – 2024-04-19 (×2): 5 mg via ORAL
  Filled 2024-04-17 (×3): qty 1

## 2024-04-17 MED ORDER — AMIODARONE HCL 200 MG PO TABS
100.0000 mg | ORAL_TABLET | Freq: Every day | ORAL | Status: DC
  Administered 2024-04-17 – 2024-04-18 (×2): 100 mg via ORAL
  Filled 2024-04-17 (×2): qty 1

## 2024-04-17 MED ORDER — FUROSEMIDE 20 MG PO TABS: 20.0000 mg | ORAL_TABLET | Freq: Every day | ORAL | Status: DC | PRN

## 2024-04-17 MED ORDER — IPRATROPIUM-ALBUTEROL 0.5-2.5 (3) MG/3ML IN SOLN: 3.0000 mL | Freq: Four times a day (QID) | RESPIRATORY_TRACT | Status: DC | PRN

## 2024-04-17 MED ORDER — IPRATROPIUM-ALBUTEROL 0.5-2.5 (3) MG/3ML IN SOLN
3.0000 mL | RESPIRATORY_TRACT | Status: AC
  Administered 2024-04-17: 3 mL via RESPIRATORY_TRACT
  Filled 2024-04-17: qty 3

## 2024-04-17 MED ORDER — LEVOTHYROXINE SODIUM 50 MCG PO TABS
62.5000 ug | ORAL_TABLET | Freq: Every day | ORAL | Status: DC
  Administered 2024-04-17 – 2024-04-19 (×3): 62.5 ug via ORAL
  Filled 2024-04-17 (×3): qty 1

## 2024-04-17 MED ORDER — TRAZODONE HCL 50 MG PO TABS
25.0000 mg | ORAL_TABLET | Freq: Every evening | ORAL | Status: DC | PRN
  Administered 2024-04-17 – 2024-04-18 (×2): 25 mg via ORAL
  Filled 2024-04-17 (×2): qty 1

## 2024-04-17 MED ORDER — DULOXETINE HCL 20 MG PO CPEP
20.0000 mg | ORAL_CAPSULE | Freq: Two times a day (BID) | ORAL | Status: DC
  Administered 2024-04-17 – 2024-04-19 (×5): 20 mg via ORAL
  Filled 2024-04-17 (×5): qty 1

## 2024-04-17 MED ORDER — MIRTAZAPINE 15 MG PO TABS
15.0000 mg | ORAL_TABLET | Freq: Every day | ORAL | Status: DC
  Administered 2024-04-17 – 2024-04-18 (×2): 15 mg via ORAL
  Filled 2024-04-17 (×2): qty 1

## 2024-04-17 MED ORDER — FERROUS SULFATE 325 (65 FE) MG PO TABS
325.0000 mg | ORAL_TABLET | ORAL | Status: DC
  Administered 2024-04-17 – 2024-04-19 (×2): 325 mg via ORAL
  Filled 2024-04-17 (×2): qty 1

## 2024-04-17 MED ORDER — BUDESONIDE 0.5 MG/2ML IN SUSP
0.5000 mg | Freq: Two times a day (BID) | RESPIRATORY_TRACT | Status: DC
  Administered 2024-04-17 – 2024-04-18 (×4): 0.5 mg via RESPIRATORY_TRACT
  Filled 2024-04-17 (×4): qty 2

## 2024-04-17 MED ORDER — PROCHLORPERAZINE EDISYLATE 10 MG/2ML IJ SOLN: 5.0000 mg | INTRAMUSCULAR | Status: DC | PRN

## 2024-04-17 MED ORDER — DIPHENHYDRAMINE-ZINC ACETATE 2-0.1 % EX CREA: 1.0000 | TOPICAL_CREAM | Freq: Three times a day (TID) | CUTANEOUS | Status: DC | PRN

## 2024-04-17 MED ORDER — IPRATROPIUM-ALBUTEROL 0.5-2.5 (3) MG/3ML IN SOLN: 3.0000 mL | Freq: Four times a day (QID) | RESPIRATORY_TRACT | Status: DC

## 2024-04-17 MED ORDER — DOXYCYCLINE HYCLATE 100 MG PO TABS
100.0000 mg | ORAL_TABLET | Freq: Two times a day (BID) | ORAL | Status: DC
  Filled 2024-04-17: qty 1

## 2024-04-17 MED ORDER — VITAMIN D 25 MCG (1000 UNIT) PO TABS
1000.0000 [IU] | ORAL_TABLET | Freq: Every day | ORAL | Status: DC
  Administered 2024-04-17 – 2024-04-19 (×3): 1000 [IU] via ORAL
  Filled 2024-04-17 (×3): qty 1

## 2024-04-17 MED ORDER — ACETAMINOPHEN 325 MG PO TABS
650.0000 mg | ORAL_TABLET | Freq: Four times a day (QID) | ORAL | Status: DC | PRN
  Administered 2024-04-17: 650 mg via ORAL
  Filled 2024-04-17: qty 2

## 2024-04-17 MED ORDER — POTASSIUM CHLORIDE CRYS ER 10 MEQ PO TBCR: 10.0000 meq | EXTENDED_RELEASE_TABLET | Freq: Two times a day (BID) | ORAL | Status: DC | PRN

## 2024-04-17 MED ORDER — LORATADINE 10 MG PO TABS
10.0000 mg | ORAL_TABLET | Freq: Every day | ORAL | Status: DC
  Administered 2024-04-17 – 2024-04-19 (×3): 10 mg via ORAL
  Filled 2024-04-17 (×3): qty 1

## 2024-04-17 MED ORDER — VITAMIN B-12 1000 MCG PO TABS
1000.0000 ug | ORAL_TABLET | Freq: Every day | ORAL | Status: DC
  Administered 2024-04-17 – 2024-04-19 (×3): 1000 ug via ORAL
  Filled 2024-04-17 (×3): qty 1

## 2024-04-17 MED ORDER — IOHEXOL 350 MG/ML SOLN
75.0000 mL | Freq: Once | INTRAVENOUS | Status: AC | PRN
  Administered 2024-04-17: 75 mL via INTRAVENOUS

## 2024-04-17 MED ORDER — PERFLUTREN LIPID MICROSPHERE
1.0000 mL | INTRAVENOUS | Status: AC | PRN
  Administered 2024-04-17: 3 mL via INTRAVENOUS

## 2024-04-17 MED ORDER — HYDRALAZINE HCL 50 MG PO TABS
50.0000 mg | ORAL_TABLET | Freq: Three times a day (TID) | ORAL | Status: DC
  Administered 2024-04-17: 50 mg via ORAL
  Filled 2024-04-17: qty 1

## 2024-04-17 MED ORDER — DIPHENHYDRAMINE HCL 25 MG PO CAPS
25.0000 mg | ORAL_CAPSULE | Freq: Four times a day (QID) | ORAL | Status: AC | PRN
  Administered 2024-04-18: 25 mg via ORAL
  Filled 2024-04-17: qty 1

## 2024-04-17 MED ORDER — ACETAMINOPHEN 650 MG RE SUPP: 650.0000 mg | Freq: Four times a day (QID) | RECTAL | Status: DC | PRN

## 2024-04-17 MED ORDER — ENOXAPARIN SODIUM 40 MG/0.4ML IJ SOSY
40.0000 mg | PREFILLED_SYRINGE | INTRAMUSCULAR | Status: DC
  Administered 2024-04-17 – 2024-04-19 (×3): 40 mg via SUBCUTANEOUS
  Filled 2024-04-17 (×3): qty 0.4

## 2024-04-17 NOTE — H&P (Signed)
 " History and Physical    Patient: Rebecca Stanley FMW:[id number] DOB: [birth year] DOA: 04/16/2024 DOS: the patient was seen and examined on 04/17/2024 PCP: Thurmond Cathlyn LABOR., MD  Patient coming from: Home  Chief Complaint:  Chief Complaint  Patient presents with   Weakness   HPI: Rebecca Stanley is a 89 y.o. female with medical history significant of chronic diastolic heart failure, seasonal allergies, osteoarthritis, skin cancer, colon polyps, chronic constipation, depression, diverticulosis, mitral valve prolapse, history of heart murmur, history of cardiac cath, history of stress test, hyperlipidemia, uncontrolled hypertension, osteoporosis, paroxysmal SVT, syncope, prediabetes, MGUS, history of iron deficiency anemia, vitamin B12 deficiency who presented to the emergency department complaints of generalized weakness associated with chills since around 1800 in the evening.  The patient took acetaminophen  around 2000.  She has been complaining of facial burning and generalized weakness since she started taking hydralazine  50 mg p.o. 3 times daily instead of as needed.  No fever, rhinorrhea, sore throat, wheezing or hemoptysis.  No chest pain, palpitations, diaphoresis, PND, orthopnea or pitting edema of the lower extremities.  No abdominal pain, nausea, emesis, diarrhea, constipation, melena or hematochezia.  No flank pain, dysuria, frequency or hematuria.  No polyuria, polydipsia, polyphagia or blurred vision.  History is provided mostly by her son Marcey.  Lab work: Urinalysis was normal.  Coronavirus, influenza and RSV PCR was negative.  Normal CBC.  Troponin was 23 then 22 ng/L.  CMP was unremarkable with the exception of a nonfasting glucose of 107 mg/dL.  Imaging: Portable 1 view chest radiograph with no acute cardiopulmonary abnormality.  CTA chest with no acute chest CT or CTA findings.  There was aortic and great vessel atherosclerosis without aneurysm or dissection.  ED course: Initial vital signs  were temperature 98.5 F, pulse 64, respiration 18, BP 148/54 mmHg and O2 sat 95% on room air.  The patient's O2 sat decreased to 89% after arriving to the emergency department.  She received supplemental oxygen and a DuoNeb.   Review of Systems: As mentioned in the history of present illness. All other systems reviewed and are negative. Past Medical History:  Diagnosis Date   (HFpEF) heart failure with preserved ejection fraction (HCC)    a. 09/2020 Echo: EF 55-60%, no rwma, Gr1DD, nl RV size/fxn. RVSP 48.67mmHg. Mild LAE. Mild MR.   Allergy    Arthritis    Blood transfusion    Cancer [facility])    Colitis    Colon polyp    Depression    Diverticulosis    Heart murmur    History of cardiac catheterization    a. 05/2016 Cath ([place]): Nl cors. EF 65%. LVEDP .   History of stress test    a. 11/2018 MV: EF 55-65%, no ischemia/infarct.   Hyperlipidemia    Hypertension    Mitral valve prolapse    a. 09/2020 Echo: Mild MR.   Osteoporosis    Palpitations    a. 07/2020 reported tachycardia-->08/2020 Zio: RSR 78 (49-143). Rare PACs/PVCs. 3 atrial runs up to 10 beats, max 162 bpm. Triggered events = sinus rhythm.   Thyroid  disease    Past Surgical History:  Procedure Laterality Date   BELPHAROPTOSIS REPAIR     bladder tack     CATARACT EXTRACTION     COLONOSCOPY     DILATION AND CURETTAGE, DIAGNOSTIC / THERAPEUTIC     gluteous medius tendon rupture     done at [facility]   heel tendon repair  HIP ARTHROSCOPY W/ LABRAL REPAIR     kyploplasty     T12, L1, L2, L3, done at [facility]   PARTIAL HYSTERECTOMY     Social History:  reports that she has never smoked. She has never used smokeless tobacco. She reports that she does not drink alcohol and does not use drugs.  Allergies[1]  Family History  Problem Relation Age of Onset   Heart disease Mother    Heart disease Other        aunt   Diabetes Other        grandmother   Breast cancer Other        aunt   Ovarian cancer Other         cousin   Rheum arthritis Father    Colon cancer Neg Hx     Prior to Admission medications  Medication Sig Start Date End Date Taking? Authorizing Provider  albuterol  (ACCUNEB ) 0.63 MG/3ML nebulizer solution Take 3 mLs (0.63 mg total) by nebulization every 6 (six) hours as needed for wheezing. 02/19/24   Kassie Acquanetta Bradley, MD  albuterol  (VENTOLIN  HFA) [phone] 2019 Base) MCG/ACT inhaler Inhale 2 puffs into the lungs every 6 (six) hours as needed for wheezing or shortness of breath. 01/13/24   Ival Domino, FNP  amiodarone  (PACERONE ) 100 MG tablet Take 1 tablet (100 mg) once daily Monday through Friday 12/27/23   Cindie Ole DASEN, MD  amLODipine  (NORVASC ) 5 MG tablet Take 1 tablet (5 mg total) by mouth at bedtime. 04/03/24 07/02/24  End, Lonni, MD  diltiazem  (CARDIZEM  CD) 120 MG 24 hr capsule TAKE 1 CAPSULE(120 MG) BY MOUTH DAILY 01/31/24   Cindie Ole DASEN, MD  diltiazem  (CARDIZEM ) 30 MG tablet Take 1 tablet (30 mg total) by mouth 4 (four) times daily as needed (for tachycardia and palpitations that do not improve with regular meds, fluids, and rest). 12/16/22   Tegeler, Lonni PARAS, MD  doxycycline  (VIBRAMYCIN ) 100 MG capsule Take 1 capsule (100 mg total) by mouth 2 (two) times daily for 10 days. 04/08/24 04/18/24  Ival Domino, FNP  DULoxetine  (CYMBALTA ) 20 MG capsule Take 20 mg by mouth 2 (two) times daily. 02/01/24 01/31/25  [provider]  furosemide  (LASIX ) 20 MG tablet Take 1 tablet (20 mg total) by mouth daily as needed. 04/03/24 07/02/24  End, Lonni, MD  hydrALAZINE  (APRESOLINE ) 50 MG tablet Take 1 tablet (50 mg total) by mouth every 8 (eight) hours. 04/03/24   End, Lonni, MD  ipratropium-albuterol  (DUONEB) 0.5-2.5 (3) MG/3ML SOLN Take 3 mLs by nebulization 3 (three) times daily. Patient taking differently: Take 3 mLs by nebulization as needed. 09/23/22   Regalado, Belkys A, MD  levothyroxine  (SYNTHROID , LEVOTHROID) 25 MCG tablet Take 62.5 mcg by mouth daily.    [provider]  mirtazapine  (REMERON ) 15 MG tablet 15 mg at bedtime.    [provider]  nitrofurantoin, macrocrystal-monohydrate, (MACROBID) 100 MG capsule Take 100 mg by mouth 2 (two) times daily.    [provider]  potassium chloride  (KLOR-CON  M) 10 MEQ tablet Take 1 tablet (10 mEq total) by mouth 2 (two) times daily as needed (When you take lasix ). 04/03/24   End, Lonni, MD  promethazine -dextromethorphan (PROMETHAZINE -DM) 6.25-15 MG/5ML syrup Take 5 mLs by mouth 4 (four) times daily as needed for cough. Do not use and drive - May make drowsy. 01/13/24   Ival Domino, FNP  Vibegron (GEMTESA) 75 MG TABS Take 75 mg by mouth daily.    [provider]  Physical Exam: Vitals:   04/17/24 0427 04/17/24 0430 04/17/24 0522 04/17/24 0558  BP: (!) 136/51 (!) 138/53 (!) 152/43   Pulse: 61 (!) 58 60   Resp: 16  17   Temp: (!) 97.4 F (36.3 C)  97.7 F (36.5 C)   TempSrc: Oral     SpO2: 95% 96% 93%   Weight:    75 kg  Height:    5' (1.524 m)   Physical Exam Vitals and nursing note reviewed.  Constitutional:      General: She is awake. She is not in acute distress.    Appearance: She is obese. She is ill-appearing.  HENT:     Head: Normocephalic.     Nose: No rhinorrhea.     Mouth/Throat:     Mouth: Mucous membranes are moist.  Eyes:     General: No scleral icterus.    Pupils: Pupils are equal, round, and reactive to light.  Neck:     Vascular: No JVD.  Cardiovascular:     Rate and Rhythm: Normal rate and regular rhythm.     Heart sounds: S1 normal and S2 normal.  Pulmonary:     Breath sounds: Wheezing present. No rhonchi or rales.  Abdominal:     General: Bowel sounds are normal. There is no distension.     Palpations: Abdomen is soft.     Tenderness: There is no abdominal tenderness. There is no right CVA tenderness or left CVA tenderness.  Musculoskeletal:     Cervical back: Neck supple.     Right lower leg: No edema.     Left lower leg: No  edema.  Skin:    General: Skin is warm and dry.  Neurological:     General: No focal deficit present.     Mental Status: She is alert and oriented to person, place, and time.  Psychiatric:        Mood and Affect: Mood normal.        Behavior: Behavior normal. Behavior is cooperative.     Data Reviewed:  Results are pending, will review when available.  09/21/2022 echocardiogram report. IMPRESSIONS:   1. Left ventricular ejection fraction, by estimation, is 65 to 70%. The left ventricle has normal function. The left ventricle has no regional wall motion abnormalities. There is moderate concentric left ventricular hypertrophy. Left ventricular diastolic parameters are consistent with Grade I diastolic dysfunction (impaired relaxation).  2. Right ventricular systolic function is normal. The right ventricular size is normal. There is normal pulmonary artery systolic pressure.  3. Left atrial size was mildly dilated.  4. The mitral valve is normal in structure. Trivial mitral valve regurgitation. No evidence of mitral stenosis.  5. The aortic valve is normal in structure. Aortic valve regurgitation is trivial. Aortic valve sclerosis/calcification is present, without any evidence of aortic stenosis. Aortic valve area, by VTI measures 2.51 cm. Aortic valve mean gradient measures  7.0 mmHg. Aortic valve Vmax measures 1.76 m/s.  6. The inferior vena cava is normal in size with greater than 50% respiratory variability, suggesting right atrial pressure of 3 mmHg.  04/17/2024 transthoracic echocardiogram report. IMPRESSIONS:   1. Left ventricular ejection fraction, by estimation, is 60 to 65%. The  left ventricle has normal function. The left ventricle has no regional  wall motion abnormalities. There is mild concentric left ventricular  hypertrophy. Left ventricular diastolic  parameters are consistent with Grade I diastolic dysfunction (impaired  relaxation).   2. Right  ventricular systolic function is normal.  The right ventricular  size is normal.   3. Left atrial size was mildly dilated.   4. There is no evidence of pericardial effusion.   5. The mitral valve is normal in structure. Trivial mitral valve  regurgitation. No evidence of mitral stenosis.   6. Tricuspid valve regurgitation is mild to moderate.   7. The aortic valve is normal in structure. Aortic valve regurgitation is  not visualized. Aortic valve sclerosis/calcification is present, without  any evidence of aortic stenosis.   8. The inferior vena cava is dilated in size with >50% respiratory  variability, suggesting right atrial pressure of 8 mmHg.   EKG: Vent. rate 67 BPM PR interval 182 ms  QRS duration 133 ms  QT/QTcB 455/481 ms  P-R-T axes 54 95 76  Sinus rhythm  RBBB and LPFB  Assessment and Plan: Principal Problem:   Acute respiratory failure with hypoxia (HCC) No clear etiology. Imaging was negative for acute pulmonary pathology Inpatient/telemetry Continue supplemental oxygen. Unable to tolerate systemic glucocorticoids. Budesonide  nebs twice daily. Scheduled and as needed bronchodilators. Continue doxycycline  100 mg p.o. twice daily. Follow-up CBC and chemistry in the morning.   Active Problems:   Uncontrolled hypertension Continue amlodipine  5 mg p.o. daily. Continue diltiazem  120 mg p.o. daily. Will hold hydralazine  given facial rash. Will give Benadryl  as needed. Allergic or intolerant to multiple antihypertensives.    Elevated troponin Minimally elevated. Likely demand ischemia.    Grade I diastolic dysfunction Has had atypical chest pain as well. Will check echocardiogram.    Hypothyroidism Continue levothyroxine  62.5 mcg p.o. daily.    CONSTIPATION, CHRONIC Secondary to CCBs, ferrous sulfate  and neurology 6. Continue MiraLAX  and stool softeners.    Mixed hyperlipidemia Follow-up with PCP for treatment options.    Peripheral  neuropathy Analgesics as needed.    Paroxysmal SVT (supraventricular tachycardia) Continue amiodarone  100 mg p.o. bedtime. Continue diltiazem  120 mg p.o. daily.    Depression Continue duloxetine  20 mg p.o. twice daily. Continue mirtazapine  15 mg p.o. at bedtime. Continue trazodone  25 mg p.o. at bedtime as needed.    Dementia (HCC) Supportive care    Prediabetes Follow fasting blood glucose.    Vitamin B12 deficiency Continue B12 supplementation.    Lumbar degenerative disc disease Analgesics as needed.    Advance Care Planning:   Code Status: Limited: Do not attempt resuscitation (DNR) -DNR-LIMITED -Do Not Intubate/DNI    Consults:   Family Communication:   Severity of Illness: The appropriate patient status for this patient is INPATIENT. Inpatient status is judged to be reasonable and necessary in order to provide the required intensity of service to ensure the patient's safety. The patient's presenting symptoms, physical exam findings, and initial radiographic and laboratory data in the context of their chronic comorbidities is felt to place them at high risk for further clinical deterioration. Furthermore, it is not anticipated that the patient will be medically stable for discharge from the hospital within 2 midnights of admission.   * I certify that at the point of admission it is my clinical judgment that the patient will require inpatient hospital care spanning beyond 2 midnights from the point of admission due to high intensity of service, high risk for further deterioration and high frequency of surveillance required.*  Author: Alm Dorn Castor, MD 04/17/2024 7:58 AM  For on call review [email or web address].   This document was prepared using Dragon voice recognition software and may contain some unintended transcription errors.     [1]  Allergies Allergen Reactions  Losartan Swelling    Facial swelling   Amlodipine  Swelling    Tolerates 2.5mg .     Cholestyramine Other (See Comments)   Cortisone Other (See Comments)     High blood pressure    Prednisone  Other (See Comments)    High blood pressure   Tramadol     Other reaction(s): Other (See Comments), Unknown Panic attacks with other meds that she is taking     Lisinopril Cough   "

## 2024-04-17 NOTE — Progress Notes (Signed)
 Pt arrived to the unit from [facility] via carelink. Report obtained from carelink as well. Pt is alert and oriented x4 but forgetful. Pt oriented to surroundings. Pt now accompanied by her son. Pt is a little tearful and expressed that she feels as if she will not make it out of the hospital. Pt comforted as much as possible. POC reviewed with pt and son to apparent understanding. Bed is in lowest position with bed alarm activated and audible, call light within reach.

## 2024-04-17 NOTE — Progress Notes (Signed)
 This nurse did the medication pass and read out the medications I was giving which included Doxycyline. Per patient and patients son  we are not taking the antibiotic because she does not need it. I tried to educate on the importance of taking it and what it was for. Patient and son still refused. RONAL Horns, NP notified about the Red Med Refusal.

## 2024-04-17 NOTE — Progress Notes (Signed)
 Plan of Care Note for accepted transfer   Patient: [name] MRN: [id number]   DOA: 04/16/2024  Facility requesting transfer: [facility]   Requesting Provider: Dr. Nettie   Reason for transfer: Generalized weakness and malaise, hypoxia    Facility course: 89 yr old female with hx of HTN, HLD, pSVT, HFpEF, MGUS, depression, and anxiety presenting with generalized weakness and malaise. Oxygen saturation was 89-95% on room air while at rest.   CMP and CBC are normal. Troponin was 23 then 22. There are no acute findings on CTA chest. UA is normal and COVID/Flu/RSV are negative.   She was started on 2 Lpm supplemental O2.   Plan of care: The patient is accepted for admission to Telemetry unit, at [facility].   Author: Evalene GORMAN Sprinkles, MD 04/17/2024  Check [email or web address] for on-call coverage.  Nursing staff, Please call [facility] Admits & Consults System-Wide number on Amion as soon as patient's arrival, so appropriate admitting provider can evaluate the pt.

## 2024-04-18 MED ORDER — METHYLPREDNISOLONE SODIUM SUCC 40 MG IJ SOLR
40.0000 mg | Freq: Every day | INTRAMUSCULAR | Status: DC
  Administered 2024-04-18: 40 mg via INTRAVENOUS
  Filled 2024-04-18: qty 1

## 2024-04-18 MED ORDER — DOXYCYCLINE HYCLATE 100 MG PO TABS
100.0000 mg | ORAL_TABLET | Freq: Two times a day (BID) | ORAL | Status: DC
  Administered 2024-04-18: 100 mg via ORAL
  Filled 2024-04-18: qty 1

## 2024-04-18 MED ORDER — POLYETHYLENE GLYCOL 3350 17 G PO PACK
17.0000 g | PACK | Freq: Every day | ORAL | Status: DC | PRN
  Administered 2024-04-18: 17 g via ORAL
  Filled 2024-04-18: qty 1

## 2024-04-18 MED ORDER — CARMEX CLASSIC LIP BALM EX OINT
1.0000 | TOPICAL_OINTMENT | CUTANEOUS | Status: DC | PRN
  Administered 2024-04-18: 1 via TOPICAL
  Filled 2024-04-18: qty 10

## 2024-04-18 MED ORDER — IPRATROPIUM-ALBUTEROL 0.5-2.5 (3) MG/3ML IN SOLN
3.0000 mL | Freq: Four times a day (QID) | RESPIRATORY_TRACT | Status: DC
  Administered 2024-04-18 – 2024-04-19 (×3): 3 mL via RESPIRATORY_TRACT
  Filled 2024-04-18 (×2): qty 3

## 2024-04-18 MED ORDER — ALBUTEROL SULFATE (2.5 MG/3ML) 0.083% IN NEBU: 2.5000 mg | INHALATION_SOLUTION | RESPIRATORY_TRACT | Status: DC | PRN

## 2024-04-18 NOTE — Progress Notes (Signed)
 " PROGRESS NOTE    Rebecca Stanley  FMW:[id number] DOB: 1930-04-20 DOA: 04/16/2024 PCP: Thurmond Cathlyn LABOR., MD   Brief Narrative:  89 year old female with history of chronic diastolic heart failure, depression, hyperlipidemia, hypertension, paroxysmal SVT, prediabetes, MGUS iron deficiency anemia, vitamin B12 deficiency presented with generalized weakness and chills.  On presentation, urinalysis was normal.  COVID/influenza/RSV PCR negative.  Troponins were 23 and 22.  Chest x-ray showed no acute cardiopulmonary abnormality.  CTA chest with no acute CT chest or CTA findings.  Her sats dropped to 89% on room air requiring supplemental oxygen.  Assessment & Plan:   Acute respiratory failure with hypoxia -No clear etiology.  Imaging as above.  No formal diagnosis of COPD.  Still requiring supplemental oxygen at 2 L/min.  Continue current nebs.  Discussed the risks and benefits of IV Solu-Medrol  use and son is okay for empiric use of Solu-Medrol .  Start Solu-Medrol  40 mg IV daily.  Doxycycline  apparently was discontinued as an outpatient and patient's son does not want doxycycline  to be continued.  Will DC doxycycline .  No evidence of pneumonia.  Procalcitonin less than 0.1.  Hypertension - Blood pressure intermittently on the lower side currently.  Mended to hold doses of amlodipine  and Cardizem  if blood pressure remains low  Paroxysmal SVT -Mild intermittent bradycardia present.  Continue amiodarone .  Continue Cardizem  if blood pressure allows  Depression--continue duloxetine , mirtazapine  and as needed trazodone  at bedtime  Dementia - Supportive care.  Fall/delirium precautions.  PT eval  Hypothyroidism -Continue levothyroxine   Elevated troponin - Troponins did not trend upwards.  No chest pain.  No further workup needed  Chronic diastolic heart failure - Echo showed EF of 60 to 65% with grade 1 diastolic dysfunction.  Might need scheduled low-dose oral diuretics if blood pressure allows.   Currently compensated.  Strict input and output.  Daily weights.  Fluid restriction.  Prediabetes - Blood sugars stable.  Vitamin B12 deficiency - Continue supplementation  Lumbar degenerative disc disease -Continue analgesics as needed  Peripheral neuropathy -continue analgesics as needed  Chronic constipation - Continue MiraLAX  and stool softeners  Mixed hyperlipidemia -Follow-up with PCP for treatment options  DVT prophylaxis: Lovenox  Code Status: DNR Family Communication: Son at bedside Disposition Plan: Status is: Inpatient Remains inpatient appropriate because: Of severity of illness    Consultants: None  Procedures: 2D echo  Antimicrobials: DC doxycycline    Subjective: Patient seen and examined at bedside.  Hard of hearing.  Feels slightly better but still short of breath with exertion.  Son present at bedside.  No fever, chest pain or abdominal pain reported.  Objective: Vitals:   04/17/24 2006 04/18/24 0524 04/18/24 0825 04/18/24 0833  BP: (!) 137/50 (!) 140/59  (!) 131/48  Pulse: (!) 57 62  (!) 53  Resp: 18 14    Temp: 98.5 F (36.9 C) 98.3 F (36.8 C)    TempSrc: Oral Oral    SpO2: 95% 97% 97%   Weight:      Height:        Intake/Output Summary (Last 24 hours) at 04/18/2024 1011 Last data filed at 04/17/2024 1800 Gross per 24 hour  Intake 717 ml  Output 200 ml  Net 517 ml   Filed Weights   04/17/24 0558  Weight: 75 kg    Examination:  General exam: Appears calm and comfortable.  Hard of hearing Respiratory system: Bilateral decreased breath sounds at bases with scattered crackles Cardiovascular system: S1 & S2 heard, mild intermittent bradycardia present Gastrointestinal  system: Abdomen is nondistended, soft and nontender. Normal bowel sounds heard. Extremities: No cyanosis, clubbing; trace lower extremity edema  Central nervous system: Awake slow to respond, poor historian.  No focal neurological deficits. Moving extremities Skin: No  rashes, lesions or ulcers Psychiatry: Flat affect.  Not agitated   Data Reviewed: I have personally reviewed following labs and imaging studies  CBC: Recent Labs  Lab 04/17/24 0011  WBC 8.0  NEUTROABS 4.9  HGB 12.0  HCT 36.5  MCV 88.4  PLT 377   Basic Metabolic Panel: Recent Labs  Lab 04/17/24 0011 04/17/24 0821  NA 139  --   K 3.8  --   CL 100  --   CO2 28  --   GLUCOSE 107*  --   BUN 18  --   CREATININE 0.89  --   CALCIUM  9.8  --   MG  --  2.3  PHOS  --  3.7   GFR: Estimated Creatinine Clearance: 37.2 mL/min (by C-G formula based on SCr of 0.89 mg/dL). Liver Function Tests: Recent Labs  Lab 04/17/24 0011  AST 20  ALT 15  ALKPHOS 71  BILITOT 0.4  PROT 7.2  ALBUMIN 4.4   No results for input(s): LIPASE, AMYLASE in the last 168 hours. No results for input(s): AMMONIA in the last 168 hours. Coagulation Profile: No results for input(s): INR, PROTIME in the last 168 hours. Cardiac Enzymes: No results for input(s): CKTOTAL, CKMB, CKMBINDEX, TROPONINI in the last 168 hours. BNP (last 3 results) No results for input(s): PROBNP in the last 8760 hours. HbA1C: No results for input(s): HGBA1C in the last 72 hours. CBG: No results for input(s): GLUCAP in the last 168 hours. Lipid Profile: No results for input(s): CHOL, HDL, LDLCALC, TRIG, CHOLHDL, LDLDIRECT in the last 72 hours. Thyroid  Function Tests: No results for input(s): TSH, T4TOTAL, FREET4, T3FREE, THYROIDAB in the last 72 hours. Anemia Panel: No results for input(s): VITAMINB12, FOLATE, FERRITIN, TIBC, IRON, RETICCTPCT in the last 72 hours. Sepsis Labs: Recent Labs  Lab 04/17/24 1307  PROCALCITON <0.10    Recent Results (from the past 240 hours)  Resp panel by RT-PCR (RSV, Flu A&B, Covid) Anterior Nasal Swab     Status: None   Collection Time: 04/16/24 11:56 PM   Specimen: Anterior Nasal Swab  Result Value Ref Range Status   SARS  Coronavirus 2 by RT PCR NEGATIVE NEGATIVE Final    Comment: (NOTE) SARS-CoV-2 target nucleic acids are NOT DETECTED.  The SARS-CoV-2 RNA is generally detectable in upper respiratory specimens during the acute phase of infection. The lowest concentration of SARS-CoV-2 viral copies this assay can detect is 138 copies/mL. A negative result does not preclude SARS-Cov-2 infection and should not be used as the sole basis for treatment or other patient management decisions. A negative result may occur with  improper specimen collection/handling, submission of specimen other than nasopharyngeal swab, presence of viral mutation(s) within the areas targeted by this assay, and inadequate number of viral copies(<138 copies/mL). A negative result must be combined with clinical observations, patient history, and epidemiological information. The expected result is Negative.  Fact Sheet for Patients:  bloggercourse.com  Fact Sheet for Healthcare Providers:  seriousbroker.it  This test is no t yet approved or cleared by the [place]  FDA and  has been authorized for detection and/or diagnosis of SARS-CoV-2 by FDA under an Emergency Use Authorization (EUA). This EUA will remain  in effect (meaning this test can be used) for the duration of  the COVID-19 declaration under Section 564(b)(1) of the Act, 21 U.S.C.section 360bbb-3(b)(1), unless the authorization is terminated  or revoked sooner.       Influenza A by PCR NEGATIVE NEGATIVE Final   Influenza B by PCR NEGATIVE NEGATIVE Final    Comment: (NOTE) The Xpert Xpress SARS-CoV-2/FLU/RSV plus assay is intended as an aid in the diagnosis of influenza from Nasopharyngeal swab specimens and should not be used as a sole basis for treatment. Nasal washings and aspirates are unacceptable for Xpert Xpress SARS-CoV-2/FLU/RSV testing.  Fact Sheet for  Patients: bloggercourse.com  Fact Sheet for Healthcare Providers: seriousbroker.it  This test is not yet approved or cleared by the [place]  FDA and has been authorized for detection and/or diagnosis of SARS-CoV-2 by FDA under an Emergency Use Authorization (EUA). This EUA will remain in effect (meaning this test can be used) for the duration of the COVID-19 declaration under Section 564(b)(1) of the Act, 21 U.S.C. section 360bbb-3(b)(1), unless the authorization is terminated or revoked.     Resp Syncytial Virus by PCR NEGATIVE NEGATIVE Final    Comment: (NOTE) Fact Sheet for Patients: bloggercourse.com  Fact Sheet for Healthcare Providers: seriousbroker.it  This test is not yet approved or cleared by the [place]  FDA and has been authorized for detection and/or diagnosis of SARS-CoV-2 by FDA under an Emergency Use Authorization (EUA). This EUA will remain in effect (meaning this test can be used) for the duration of the COVID-19 declaration under Section 564(b)(1) of the Act, 21 U.S.C. section 360bbb-3(b)(1), unless the authorization is terminated or revoked.  Performed at [facility], [address] [place], [place] [address]          Radiology Studies: ECHOCARDIOGRAM COMPLETE Result Date: 04/17/2024    ECHOCARDIOGRAM REPORT   Patient Name:   DORINNE GRAEFF Summa Date of Exam: 04/17/2024 Medical Rec #:  [id number]     Height:       60.0 in Accession #:    [id number]    Weight:       165.3 lb Date of Birth:  02-Nov-1930     BSA:          1.722 m Patient Age:    89 years      BP:           148/61 mmHg Patient Gender: F             HR:           65 bpm. Exam Location:  Inpatient Procedure: 2D Echo, Cardiac Doppler, Color Doppler and Intracardiac            Opacification Agent (Both Spectral and Color Flow Doppler were            utilized during procedure).  Indications:    Chest Pain  History:        Patient has prior history of Echocardiogram examinations, most                 recent 09/21/2022. Risk Factors:Dyslipidemia.  Sonographer:    Philomena Daring Referring Phys: [id number] DAVID MANUEL ORTIZ IMPRESSIONS  1. Left ventricular ejection fraction, by estimation, is 60 to 65%. The left ventricle has normal function. The left ventricle has no regional wall motion abnormalities. There is mild concentric left ventricular hypertrophy. Left ventricular diastolic parameters are consistent with Grade I diastolic dysfunction (impaired relaxation).  2. Right ventricular systolic function is normal. The right ventricular size is normal.  3. Left atrial size was  mildly dilated.  4. There is no evidence of pericardial effusion.  5. The mitral valve is normal in structure. Trivial mitral valve regurgitation. No evidence of mitral stenosis.  6. Tricuspid valve regurgitation is mild to moderate.  7. The aortic valve is normal in structure. Aortic valve regurgitation is not visualized. Aortic valve sclerosis/calcification is present, without any evidence of aortic stenosis.  8. The inferior vena cava is dilated in size with >50% respiratory variability, suggesting right atrial pressure of 8 mmHg. FINDINGS  Left Ventricle: Left ventricular ejection fraction, by estimation, is 60 to 65%. The left ventricle has normal function. The left ventricle has no regional wall motion abnormalities. Definity  contrast agent was given IV to delineate the left ventricular  endocardial borders. There is mild concentric left ventricular hypertrophy. Left ventricular diastolic parameters are consistent with Grade I diastolic dysfunction (impaired relaxation). Right Ventricle: The right ventricular size is normal. No increase in right ventricular wall thickness. Right ventricular systolic function is normal. Left Atrium: Left atrial size was mildly dilated. Right Atrium: Right atrial size was normal in size.  Pericardium: There is no evidence of pericardial effusion. Mitral Valve: The mitral valve is normal in structure. Trivial mitral valve regurgitation. No evidence of mitral valve stenosis. Tricuspid Valve: The tricuspid valve is normal in structure. Tricuspid valve regurgitation is mild to moderate. No evidence of tricuspid stenosis. Aortic Valve: The aortic valve is normal in structure. Aortic valve regurgitation is not visualized. Aortic valve sclerosis/calcification is present, without any evidence of aortic stenosis. Aortic valve mean gradient measures 5.0 mmHg. Aortic valve peak  gradient measures 9.5 mmHg. Aortic valve area, by VTI measures 2.13 cm. Pulmonic Valve: The pulmonic valve was normal in structure. Pulmonic valve regurgitation is trivial. No evidence of pulmonic stenosis. Aorta: The aortic root and ascending aorta are structurally normal, with no evidence of dilitation. Venous: The inferior vena cava is dilated in size with greater than 50% respiratory variability, suggesting right atrial pressure of 8 mmHg. IAS/Shunts: No atrial level shunt detected by color flow Doppler.  LEFT VENTRICLE PLAX 2D LVIDd:         3.20 cm   Diastology LVIDs:         1.90 cm   LV e' medial:    5.55 cm/s LV PW:         0.80 cm   LV E/e' medial:  18.4 LV IVS:        1.30 cm   LV e' lateral:   5.98 cm/s LVOT diam:     1.80 cm   LV E/e' lateral: 17.1 LV SV:         75 LV SV Index:   43 LVOT Area:     2.54 cm  RIGHT VENTRICLE             IVC RV S prime:     16.20 cm/s  IVC diam: 2.10 cm TAPSE (M-mode): 1.7 cm                             PULMONARY VEINS                             Diastolic Velocity: 65.50 cm/s                             S/D Velocity:  1.50                             Systolic Velocity:  99.90 cm/s LEFT ATRIUM             Index        RIGHT ATRIUM           Index LA diam:        3.00 cm 1.74 cm/m   RA Area:     13.60 cm LA Vol (A2C):   53.8 ml 31.25 ml/m  RA Volume:   33.40 ml  19.40 ml/m LA Vol  (A4C):   56.2 ml 32.64 ml/m LA Biplane Vol: 57.0 ml 33.11 ml/m  AORTIC VALVE AV Area (Vmax):    1.95 cm AV Area (Vmean):   2.02 cm AV Area (VTI):     2.13 cm AV Vmax:           154.00 cm/s AV Vmean:          104.000 cm/s AV VTI:            0.352 m AV Peak Grad:      9.5 mmHg AV Mean Grad:      5.0 mmHg LVOT Vmax:         118.00 cm/s LVOT Vmean:        82.600 cm/s LVOT VTI:          0.294 m LVOT/AV VTI ratio: 0.84  AORTA Ao Root diam: 2.90 cm Ao Asc diam:  3.20 cm MITRAL VALVE                TRICUSPID VALVE MV Area (PHT): 3.45 cm     TR Peak grad:   41.5 mmHg MV Decel Time: 220 msec     TR Vmax:        322.00 cm/s MV E velocity: 102.00 cm/s MV A velocity: 130.00 cm/s  SHUNTS MV E/A ratio:  0.78         Systemic VTI:  0.29 m                             Systemic Diam: 1.80 cm Kardie Tobb DO Electronically signed by Dub Huntsman DO Signature Date/Time: 04/17/2024/3:11:37 PM    Final    CT Angio Chest PE W and/or Wo Contrast Result Date: 04/17/2024 EXAM: CTA CHEST 04/17/2024 02:37:50 AM TECHNIQUE: CTA of the chest was performed without and with intravenous contrast. Multiplanar reformatted images are provided for review. MIP images are provided for review. Automated exposure control, iterative reconstruction, and/or weight based adjustment of the mA/kV was utilized to reduce the radiation dose to as low as reasonably achievable. COMPARISON: Portable chest from today, portable chest 04/10/2024, several chest films from last year most recently 01/14/2024, CTA chest 09/20/2022, and chest CT without contrast 04/20/2018. CLINICAL HISTORY: Syncope/presyncope, cerebrovascular cause suspected. FINDINGS: PULMONARY ARTERIES: Pulmonary arteries are normal caliber and well opacified without evidence of emboli. MEDIASTINUM: There is trace single vessel calcific plaque in the left main coronary artery. Aortic and great vessel atherosclerosis without aneurysm or dissection. No venous distention. No pericardial effusion. The heart  is not enlarged. LYMPH NODES: No mediastinal, hilar or axillary lymphadenopathy. LUNGS AND PLEURA: 5 mm round and non-calcified right middle lobe nodule consistent with a benign process given length of stability. There are no suspicious or further nodules. Mild posterior atelectasis in the lower lobes. There  is no consolidation, effusion, or pneumothorax. UPPER ABDOMEN: There is a small hiatal hernia. Patulous esophagus again noted, compatible with chronic dysmotility. No wall thickening. SOFT TISSUES AND BONES: There is a chronic treated T12 compression fracture, osteopenia, and degenerative changes of the spine. Reverse right shoulder arthroplasty with spray artifacts. No acute or other significant osseous findings. IMPRESSION: 1. No acute chest CT or CTA findings. 2. Aortic and great vessel atherosclerosis without aneurysm or dissection. Electronically signed by: Francis Quam MD 04/17/2024 03:06 AM EST RP Workstation: [id number]   DG Chest Portable 1 View Result Date: 04/17/2024 EXAM: 1 VIEW(S) XRAY OF THE CHEST 04/17/2024 12:11:19 AM COMPARISON: 04/08/2024 CLINICAL HISTORY: FINDINGS: LUNGS AND PLEURA: Subsegmental atelectasis or scarring at left lung base. No pleural effusion. No pneumothorax. HEART AND MEDIASTINUM: Aortic atherosclerosis. No acute abnormality of the cardiac and mediastinal silhouettes. BONES AND SOFT TISSUES: Right shoulder arthroplasty noted. Stable thoracolumbar spinal augmentation. No acute osseous abnormality. IMPRESSION: 1. No acute cardiopulmonary abnormality. Electronically signed by: Norman Gatlin MD 04/17/2024 12:21 AM EST RP Workstation: [id number]        Scheduled Meds:  amiodarone   100 mg Oral QHS   amLODipine   5 mg Oral Daily   budesonide  (PULMICORT ) nebulizer solution  0.5 mg Nebulization BID   cholecalciferol   1,000 Units Oral Daily   cyanocobalamin   1,000 mcg Oral Daily   diltiazem   120 mg Oral Daily   DULoxetine   20 mg Oral BID   enoxaparin  (LOVENOX )  injection  40 mg Subcutaneous Q24H   ferrous sulfate   325 mg Oral Q M,W,F   levothyroxine   62.5 mcg Oral Daily   loratadine   10 mg Oral Daily   methylPREDNISolone  (SOLU-MEDROL ) injection  40 mg Intravenous Daily   mirtazapine   15 mg Oral QHS   Continuous Infusions:        Sophie Mao, MD [facility] 04/18/2024, 10:11 AM ' "

## 2024-04-18 NOTE — Progress Notes (Signed)
" °   04/18/24 0851  TOC Brief Assessment  Insurance and Status Reviewed  Patient has primary care physician Yes Lawson, Cathlyn LABOR., MD)  Home environment has been reviewed home  Prior level of function: Independent  Prior/Current Home Services No current home services  Social Drivers of Health Review SDOH reviewed no interventions necessary  Readmission risk has been reviewed Yes  Transition of care needs no transition of care needs at this time    "

## 2024-04-18 NOTE — Progress Notes (Signed)
 Mobility Specialist - Progress Note:   04/18/24 1504  Mobility  Activity Ambulated with assistance  Level of Assistance Contact guard assist, steadying assist  Assistive Device Four wheel walker  Distance Ambulated (ft) 250 ft  Activity Response Tolerated well  Mobility Referral Yes  Mobility visit 1 Mobility  Mobility Specialist Start Time (ACUTE ONLY) 1425  Mobility Specialist Stop Time (ACUTE ONLY) 1434  Mobility Specialist Time Calculation (min) (ACUTE ONLY) 9 min   Pt was received in recliner and agreed to mobility. No complaints/issues during ambulation. Returned to use [facility]. Instructed to use call bell for assistance from NT.  [employer] - Mobility Specialist - Acute Rehabilitation Can be reached via [employer]

## 2024-04-18 NOTE — Progress Notes (Signed)
 Mobility Specialist - Progress Note:   04/18/24 1142  Mobility  Activity Ambulated with assistance  Level of Assistance Contact guard assist, steadying assist  Assistive Device Four wheel walker  Distance Ambulated (ft) 200 ft  Activity Response Tolerated well  Mobility Referral Yes  Mobility visit 1 Mobility  Mobility Specialist Start Time (ACUTE ONLY) 1110  Mobility Specialist Stop Time (ACUTE ONLY) 1126  Mobility Specialist Time Calculation (min) (ACUTE ONLY) 16 min   Pt was received in bed and agreed to O2 ambulation test:  Nurse requested Mobility Specialist to perform oxygen saturation test with pt which includes removing pt from oxygen both at rest and while ambulating.  Below are the results from that testing.     Patient Saturations on Room Air at Rest = spO2 92%  Patient Saturations on Room Air while Ambulating = sp02 89-90% .    At end of testing pt left in room on Room Air - sp02 91%  Returned to recliner with all needs met. Left in room with NT.  Reported results to nurse.    [employer] - Mobility Specialist - Acute Rehabilitation Can be reached via [employer]

## 2024-04-18 NOTE — Plan of Care (Signed)
  Problem: Activity: Goal: Risk for activity intolerance will decrease Outcome: Progressing   Problem: Nutrition: Goal: Adequate nutrition will be maintained Outcome: Progressing   Problem: Coping: Goal: Level of anxiety will decrease Outcome: Progressing   Problem: Pain Managment: Goal: General experience of comfort will improve and/or be controlled Outcome: Progressing

## 2024-04-19 LAB — BASIC METABOLIC PANEL WITH GFR
Anion gap: 12 (ref 5–15)
BUN: 17 mg/dL (ref 8–23)
CO2: 27 mmol/L (ref 22–32)
Calcium: 9.9 mg/dL (ref 8.9–10.3)
Chloride: 99 mmol/L (ref 98–111)
Creatinine, Ser: 0.74 mg/dL (ref 0.44–1.00)
GFR, Estimated: >60 mL/min
Glucose, Bld: 92 mg/dL (ref 70–99)
Potassium: 4.3 mmol/L (ref 3.5–5.1)
Sodium: 138 mmol/L (ref 135–145)

## 2024-04-19 LAB — MAGNESIUM: Magnesium: 2.4 mg/dL (ref 1.7–2.4)

## 2024-04-19 MED ORDER — POTASSIUM CHLORIDE CRYS ER 10 MEQ PO TBCR: 10.0000 meq | EXTENDED_RELEASE_TABLET | ORAL | Status: AC

## 2024-04-19 MED ORDER — FUROSEMIDE 20 MG PO TABS: 20.0000 mg | ORAL_TABLET | ORAL | Status: AC

## 2024-04-19 MED ORDER — AMIODARONE HCL 100 MG PO TABS: 100.0000 mg | ORAL_TABLET | Freq: Every day | ORAL | Status: AC

## 2024-04-19 MED ORDER — METHYLPREDNISOLONE SODIUM SUCC 40 MG IJ SOLR
40.0000 mg | Freq: Every day | INTRAMUSCULAR | Status: DC
  Administered 2024-04-19: 40 mg via INTRAVENOUS
  Filled 2024-04-19: qty 1

## 2024-04-19 MED ORDER — PREDNISONE 20 MG PO TABS: 40.0000 mg | ORAL_TABLET | Freq: Every day | ORAL | 0 refills | Status: AC

## 2024-04-19 NOTE — Discharge Summary (Signed)
 Physician Discharge Summary  ARLENE BRICKEL FMW:[id number] DOB: 1930/03/21 DOA: 04/16/2024  PCP: Thurmond Cathlyn LABOR., MD  Admit date: 04/16/2024 Discharge date: 04/19/2024  Admitted From: Home Disposition: Home  Recommendations for Outpatient Follow-up:  Follow up with PCP in 1 week with repeat CBC/BMP Outpatient follow-up with cardiology Follow up in ED if symptoms worsen or new appear   Home Health: No Equipment/Devices: None  Discharge Condition: Stable CODE STATUS: DNR Diet recommendation: Heart healthy  Brief/Interim Summary: 89 year old female with history of chronic diastolic heart failure, depression, hyperlipidemia, hypertension, paroxysmal SVT, prediabetes, MGUS iron deficiency anemia, vitamin B12 deficiency presented with generalized weakness and chills.  On presentation, urinalysis was normal.  COVID/influenza/RSV PCR negative.  Troponins were 23 and 22.  Chest x-ray showed no acute cardiopulmonary abnormality.  CTA chest with no acute CT chest or CTA findings.  Her sats dropped to 89% on room air requiring supplemental oxygen.  Subsequently, she was put on IV Solu-Medrol .  Her condition has improved.  She is currently on room air.  She feels much better.  She will be discharged home today on oral prednisone  40 mg daily for 7 days.  Discharge Diagnoses:   Acute respiratory failure with hypoxia -No clear etiology.  Imaging as above.  No formal diagnosis of COPD.  Required 2 L oxygen via nasal cannula initially but currently on room air.  Treated with IV Solu-Medrol  and nebs.  No evidence of pneumonia; procalcitonin less than 0.1.  Doxycycline  discontinued  - Feels much better and feels okay to go home today.  Discharge patient home today on oral prednisone  40 mg daily for 7 days.  Continue as needed albuterol .  Recommend outpatient follow-up with pulmonary   hypertension - Blood pressure intermittently stable currently.  Continue amlodipine , Lasix  and Cardizem .  Hold hydralazine  till  reevaluation with PCP   paroxysmal SVT -Mild intermittent bradycardia present.  Continue amiodarone .  Continue Cardizem     Depression--continue duloxetine , mirtazapine  and as needed trazodone  at bedtime   Dementia - Supportive care.  Outpatient follow-up  Hypothyroidism -Continue levothyroxine    Elevated troponin - Troponins did not trend upwards.  No chest pain.  No further workup needed   Chronic diastolic heart failure - Echo showed EF of 60 to 65% with grade 1 diastolic dysfunction.  Resume home regimen of Lasix .  Currently compensated.  Continue fluid and diet restriction.  Outpatient follow-up with cardiology   Prediabetes - Blood sugars stable.   Vitamin B12 deficiency - Continue supplementation   Lumbar degenerative disc disease -Continue analgesics as needed   Peripheral neuropathy -continue analgesics as needed   Chronic constipation - Continue MiraLAX  and stool softeners as needed   Mixed hyperlipidemia -Follow-up with PCP for treatment options  Obesity class I - Outpatient follow-up  Discharge Instructions  Discharge Instructions     Diet - low sodium heart healthy   Complete by: As directed    Increase activity slowly   Complete by: As directed       Allergies as of 04/19/2024       Reactions   Losartan Swelling   Facial swelling   Amlodipine  Swelling   Tolerates 2.5mg .    Cholestyramine Other (See Comments)   Unknown   Cortisone Other (See Comments)    High blood pressure   Prednisone  Other (See Comments)   High blood pressure   Tramadol    Panic attacks when also taking perphenazine and amitriptyline    Lisinopril Cough        Medication  List     STOP taking these medications    doxycycline  100 MG capsule Commonly known as: VIBRAMYCIN    Gemtesa 75 MG Tabs Generic drug: Vibegron   hydrALAZINE  50 MG tablet Commonly known as: APRESOLINE    nitrofurantoin (macrocrystal-monohydrate) 100 MG capsule Commonly known as:  MACROBID   promethazine -dextromethorphan 6.25-15 MG/5ML syrup Commonly known as: PROMETHAZINE -DM       TAKE these medications    albuterol  108 (90 Base) MCG/ACT inhaler Commonly known as: VENTOLIN  HFA Inhale 2 puffs into the lungs every 6 (six) hours as needed for wheezing or shortness of breath.   albuterol  0.63 MG/3ML nebulizer solution Commonly known as: ACCUNEB  Take 3 mLs (0.63 mg total) by nebulization every 6 (six) hours as needed for wheezing.   amiodarone  100 MG tablet Commonly known as: Pacerone  Take 1 tablet (100 mg total) by mouth at bedtime.   amLODipine  5 MG tablet Commonly known as: NORVASC  Take 1 tablet (5 mg total) by mouth at bedtime.   cholecalciferol  25 MCG (1000 UNIT) tablet Commonly known as: VITAMIN D3 Take 1,000 Units by mouth daily.   cyanocobalamin  1000 MCG tablet Commonly known as: VITAMIN B12 Take 1,000 mcg by mouth daily.   diltiazem  120 MG 24 hr capsule Commonly known as: CARDIZEM  CD TAKE 1 CAPSULE(120 MG) BY MOUTH DAILY   diltiazem  30 MG tablet Commonly known as: Cardizem  Take 1 tablet (30 mg total) by mouth 4 (four) times daily as needed (for tachycardia and palpitations that do not improve with regular meds, fluids, and rest).   DULoxetine  20 MG capsule Commonly known as: CYMBALTA  Take 20 mg by mouth 2 (two) times daily.   estradiol 0.01 % Crea vaginal cream Commonly known as: ESTRACE Place 1 Applicatorful vaginally at bedtime.   ferrous sulfate  325 (65 FE) MG EC tablet Take 325 mg by mouth every Monday, Wednesday, and Friday.   furosemide  20 MG tablet Commonly known as: LASIX  Take 1 tablet (20 mg total) by mouth every other day.   levothyroxine  25 MCG tablet Commonly known as: SYNTHROID  Take 62.5 mcg by mouth daily.   mirtazapine  15 MG tablet Commonly known as: REMERON  Take 15 mg by mouth at bedtime.   potassium chloride  10 MEQ tablet Commonly known as: KLOR-CON  M Take 1 tablet (10 mEq total) by mouth every other day.  Takes with every furosemide  dose   predniSONE  20 MG tablet Commonly known as: DELTASONE  Take 2 tablets (40 mg total) by mouth daily with breakfast for 7 days. Start taking on: April 20, 2024   traZODone  50 MG tablet Commonly known as: DESYREL  Take 25 mg by mouth at bedtime as needed for sleep.        Follow-up Information     Thurmond Cathlyn LABOR., MD. Schedule an appointment as soon as possible for a visit in 1 week(s).   Specialty: Internal Medicine Contact information: [address] [place] [address] [phone]                Allergies[1]  Consultations: None   Procedures/Studies: ECHOCARDIOGRAM COMPLETE Result Date: 04/17/2024    ECHOCARDIOGRAM REPORT   Patient Name:   VALESKA HAISLIP Eriksen Date of Exam: 04/17/2024 Medical Rec #:  [id number]     Height:       60.0 in Accession #:    [id number]    Weight:       165.3 lb Date of Birth:  1930/10/29     BSA:          1.722  m Patient Age:    89 years      BP:           148/61 mmHg Patient Gender: F             HR:           65 bpm. Exam Location:  Inpatient Procedure: 2D Echo, Cardiac Doppler, Color Doppler and Intracardiac            Opacification Agent (Both Spectral and Color Flow Doppler were            utilized during procedure). Indications:    Chest Pain  History:        Patient has prior history of Echocardiogram examinations, most                 recent 09/21/2022. Risk Factors:Dyslipidemia.  Sonographer:    Philomena Daring Referring Phys: [id number] DAVID MANUEL ORTIZ IMPRESSIONS  1. Left ventricular ejection fraction, by estimation, is 60 to 65%. The left ventricle has normal function. The left ventricle has no regional wall motion abnormalities. There is mild concentric left ventricular hypertrophy. Left ventricular diastolic parameters are consistent with Grade I diastolic dysfunction (impaired relaxation).  2. Right ventricular systolic function is normal. The right ventricular size is normal.  3. Left atrial size was mildly  dilated.  4. There is no evidence of pericardial effusion.  5. The mitral valve is normal in structure. Trivial mitral valve regurgitation. No evidence of mitral stenosis.  6. Tricuspid valve regurgitation is mild to moderate.  7. The aortic valve is normal in structure. Aortic valve regurgitation is not visualized. Aortic valve sclerosis/calcification is present, without any evidence of aortic stenosis.  8. The inferior vena cava is dilated in size with >50% respiratory variability, suggesting right atrial pressure of 8 mmHg. FINDINGS  Left Ventricle: Left ventricular ejection fraction, by estimation, is 60 to 65%. The left ventricle has normal function. The left ventricle has no regional wall motion abnormalities. Definity  contrast agent was given IV to delineate the left ventricular  endocardial borders. There is mild concentric left ventricular hypertrophy. Left ventricular diastolic parameters are consistent with Grade I diastolic dysfunction (impaired relaxation). Right Ventricle: The right ventricular size is normal. No increase in right ventricular wall thickness. Right ventricular systolic function is normal. Left Atrium: Left atrial size was mildly dilated. Right Atrium: Right atrial size was normal in size. Pericardium: There is no evidence of pericardial effusion. Mitral Valve: The mitral valve is normal in structure. Trivial mitral valve regurgitation. No evidence of mitral valve stenosis. Tricuspid Valve: The tricuspid valve is normal in structure. Tricuspid valve regurgitation is mild to moderate. No evidence of tricuspid stenosis. Aortic Valve: The aortic valve is normal in structure. Aortic valve regurgitation is not visualized. Aortic valve sclerosis/calcification is present, without any evidence of aortic stenosis. Aortic valve mean gradient measures 5.0 mmHg. Aortic valve peak  gradient measures 9.5 mmHg. Aortic valve area, by VTI measures 2.13 cm. Pulmonic Valve: The pulmonic valve was normal  in structure. Pulmonic valve regurgitation is trivial. No evidence of pulmonic stenosis. Aorta: The aortic root and ascending aorta are structurally normal, with no evidence of dilitation. Venous: The inferior vena cava is dilated in size with greater than 50% respiratory variability, suggesting right atrial pressure of 8 mmHg. IAS/Shunts: No atrial level shunt detected by color flow Doppler.  LEFT VENTRICLE PLAX 2D LVIDd:         3.20 cm   Diastology LVIDs:  1.90 cm   LV e' medial:    5.55 cm/s LV PW:         0.80 cm   LV E/e' medial:  18.4 LV IVS:        1.30 cm   LV e' lateral:   5.98 cm/s LVOT diam:     1.80 cm   LV E/e' lateral: 17.1 LV SV:         75 LV SV Index:   43 LVOT Area:     2.54 cm  RIGHT VENTRICLE             IVC RV S prime:     16.20 cm/s  IVC diam: 2.10 cm TAPSE (M-mode): 1.7 cm                             PULMONARY VEINS                             Diastolic Velocity: 65.50 cm/s                             S/D Velocity:       1.50                             Systolic Velocity:  99.90 cm/s LEFT ATRIUM             Index        RIGHT ATRIUM           Index LA diam:        3.00 cm 1.74 cm/m   RA Area:     13.60 cm LA Vol (A2C):   53.8 ml 31.25 ml/m  RA Volume:   33.40 ml  19.40 ml/m LA Vol (A4C):   56.2 ml 32.64 ml/m LA Biplane Vol: 57.0 ml 33.11 ml/m  AORTIC VALVE AV Area (Vmax):    1.95 cm AV Area (Vmean):   2.02 cm AV Area (VTI):     2.13 cm AV Vmax:           154.00 cm/s AV Vmean:          104.000 cm/s AV VTI:            0.352 m AV Peak Grad:      9.5 mmHg AV Mean Grad:      5.0 mmHg LVOT Vmax:         118.00 cm/s LVOT Vmean:        82.600 cm/s LVOT VTI:          0.294 m LVOT/AV VTI ratio: 0.84  AORTA Ao Root diam: 2.90 cm Ao Asc diam:  3.20 cm MITRAL VALVE                TRICUSPID VALVE MV Area (PHT): 3.45 cm     TR Peak grad:   41.5 mmHg MV Decel Time: 220 msec     TR Vmax:        322.00 cm/s MV E velocity: 102.00 cm/s MV A velocity: 130.00 cm/s  SHUNTS MV E/A ratio:  0.78          Systemic VTI:  0.29 m  Systemic Diam: 1.80 cm Dub Tobb DO Electronically signed by Kardie Tobb DO Signature Date/Time: 04/17/2024/3:11:37 PM    Final    CT Angio Chest PE W and/or Wo Contrast Result Date: 04/17/2024 EXAM: CTA CHEST 04/17/2024 02:37:50 AM TECHNIQUE: CTA of the chest was performed without and with intravenous contrast. Multiplanar reformatted images are provided for review. MIP images are provided for review. Automated exposure control, iterative reconstruction, and/or weight based adjustment of the mA/kV was utilized to reduce the radiation dose to as low as reasonably achievable. COMPARISON: Portable chest from today, portable chest 04/10/2024, several chest films from last year most recently 01/14/2024, CTA chest 09/20/2022, and chest CT without contrast 04/20/2018. CLINICAL HISTORY: Syncope/presyncope, cerebrovascular cause suspected. FINDINGS: PULMONARY ARTERIES: Pulmonary arteries are normal caliber and well opacified without evidence of emboli. MEDIASTINUM: There is trace single vessel calcific plaque in the left main coronary artery. Aortic and great vessel atherosclerosis without aneurysm or dissection. No venous distention. No pericardial effusion. The heart is not enlarged. LYMPH NODES: No mediastinal, hilar or axillary lymphadenopathy. LUNGS AND PLEURA: 5 mm round and non-calcified right middle lobe nodule consistent with a benign process given length of stability. There are no suspicious or further nodules. Mild posterior atelectasis in the lower lobes. There is no consolidation, effusion, or pneumothorax. UPPER ABDOMEN: There is a small hiatal hernia. Patulous esophagus again noted, compatible with chronic dysmotility. No wall thickening. SOFT TISSUES AND BONES: There is a chronic treated T12 compression fracture, osteopenia, and degenerative changes of the spine. Reverse right shoulder arthroplasty with spray artifacts. No acute or other significant  osseous findings. IMPRESSION: 1. No acute chest CT or CTA findings. 2. Aortic and great vessel atherosclerosis without aneurysm or dissection. Electronically signed by: Francis Quam MD 04/17/2024 03:06 AM EST RP Workstation: [id number]   DG Chest Portable 1 View Result Date: 04/17/2024 EXAM: 1 VIEW(S) XRAY OF THE CHEST 04/17/2024 12:11:19 AM COMPARISON: 04/08/2024 CLINICAL HISTORY: FINDINGS: LUNGS AND PLEURA: Subsegmental atelectasis or scarring at left lung base. No pleural effusion. No pneumothorax. HEART AND MEDIASTINUM: Aortic atherosclerosis. No acute abnormality of the cardiac and mediastinal silhouettes. BONES AND SOFT TISSUES: Right shoulder arthroplasty noted. Stable thoracolumbar spinal augmentation. No acute osseous abnormality. IMPRESSION: 1. No acute cardiopulmonary abnormality. Electronically signed by: Norman Gatlin MD 04/17/2024 12:21 AM EST RP Workstation: [id number]   DG Chest 2 View Result Date: 04/08/2024 EXAM: 2 VIEW(S) XRAY OF THE CHEST 04/08/2024 03:33:20 PM COMPARISON: 04/04/2024 CLINICAL HISTORY: Cough. FINDINGS: LINES, TUBES AND DEVICES: Spinal cord stimulator partially visualized. Reverse right shoulder arthroplasty in expected location. LUNGS AND PLEURA: Mild bibasilar scarring or atelectasis. No pleural effusion. No pneumothorax. HEART AND MEDIASTINUM: No acute abnormality of the cardiac and mediastinal silhouettes. Aortic atherosclerosis. BONES AND SOFT TISSUES: Vertebral augmentation changes in lumbar spine. IMPRESSION: 1. No acute cardiopulmonary abnormality. 2. Mild bibasilar scarring or atelectasis. Electronically signed by: Elsie Gravely MD 04/08/2024 03:43 PM EST RP Workstation: [id number]   DG Chest 2 View Result Date: 04/04/2024 EXAM: 2 VIEW(S) XRAY OF THE CHEST 04/04/2024 06:57:41 AM COMPARISON: 01/13/2024 CLINICAL HISTORY: 89 year old female with possible esophageal foreign body, possibly a plastic cap. Lower lung volumes. Partially visible multilevel previous  lower thoracic and/or upper lumbar compression fracture augmentation and chronic spinal electrodes there. Advanced abdominal aortic calcified atherosclerosis. On the lateral view, the lung volumes are more normal, stable. FINDINGS: LINES, TUBES AND DEVICES: Chronic spinal electrodes present. LUNGS AND PLEURA: Low lung volumes are noted on the frontal view. No new retained radiopaque foreign body identified. On  the lateral view, lung volumes are more normal and stable. Mild bibasilar airspace opacities. No pleural effusion. No pneumothorax. HEART AND MEDIASTINUM: No acute abnormality of the cardiac and mediastinal silhouettes. Advanced abdominal aortic calcified atherosclerosis is noted. BONES AND SOFT TISSUES: Partially imaged right shoulder arthroplasty hardware. Post vertebroplasty changes are present in 2 lower thoracic vertebrae, associated with multilevel previous lower thoracic and/or upper lumbar compression fracture augmentation. No acute osseous abnormality. IMPRESSION: 1. No retained radiopaque foreign body identified. 2. Lower lung volumes on the AP view with mild atelectasis. No other acute cardiopulmonary abnormality. 3. Aortic Atherosclerosis (ICD10-I70.0). Electronically signed by: Helayne Hurst MD 04/04/2024 07:08 AM EST RP Workstation: [id number]   DG Neck Soft Tissue Result Date: 04/04/2024 EXAM: 1 VIEW XRAY OF THE SOFT TISSUE NECK 04/04/2024 06:57:41 AM COMPARISON: None available. CLINICAL HISTORY: 89 year old female. Possible esophageal foreign body, specifically a plastic cap. FINDINGS: SOFT TISSUES: Normal prevertebral soft tissue contour. Pharyngeal and laryngeal contours are within normal limits. Calcified right carotid artery atherosclerosis. No retained radiopaque foreign body identified. Normal epiglottis. BONES: Partially visible shoulder arthroplasty. Chronic severe cervical spine degeneration. Negative visible thoracic inlet. No acute abnormality. IMPRESSION: 1. No retained radiopaque  foreign body identified. Electronically signed by: Helayne Hurst MD 04/04/2024 07:05 AM EST RP Workstation: [id number]      Subjective: Patient seen and examined at bedside.  Hard of hearing.  Feels better and feels okay to go home today.  Denies worsening shortness of breath, chest pain or fever.  Discharge Exam: Vitals:   04/19/24 0244 04/19/24 0510  BP:  (!) 155/59  Pulse:  87  Resp:  16  Temp:  98 F (36.7 C)  SpO2: 95% 96%    General: Pt is alert, awake, not in acute distress.  Hard of hearing.  Elderly female lying in bed.  On room air.  Slow to respond. Cardiovascular: rate controlled, S1/S2 + Respiratory: bilateral decreased breath sounds at bases with scattered crackles Abdominal: Soft, obese, NT, ND, bowel sounds + Extremities: Trace lower extremity edema; no cyanosis    The results of significant diagnostics from this hospitalization (including imaging, microbiology, ancillary and laboratory) are listed below for reference.     Microbiology: Recent Results (from the past 240 hours)  Resp panel by RT-PCR (RSV, Flu A&B, Covid) Anterior Nasal Swab     Status: None   Collection Time: 04/16/24 11:56 PM   Specimen: Anterior Nasal Swab  Result Value Ref Range Status   SARS Coronavirus 2 by RT PCR NEGATIVE NEGATIVE Final    Comment: (NOTE) SARS-CoV-2 target nucleic acids are NOT DETECTED.  The SARS-CoV-2 RNA is generally detectable in upper respiratory specimens during the acute phase of infection. The lowest concentration of SARS-CoV-2 viral copies this assay can detect is 138 copies/mL. A negative result does not preclude SARS-Cov-2 infection and should not be used as the sole basis for treatment or other patient management decisions. A negative result may occur with  improper specimen collection/handling, submission of specimen other than nasopharyngeal swab, presence of viral mutation(s) within the areas targeted by this assay, and inadequate number of  viral copies(<138 copies/mL). A negative result must be combined with clinical observations, patient history, and epidemiological information. The expected result is Negative.  Fact Sheet for Patients:  bloggercourse.com  Fact Sheet for Healthcare Providers:  seriousbroker.it  This test is no t yet approved or cleared by the [place]  FDA and  has been authorized for detection and/or diagnosis of SARS-CoV-2 by FDA under an Emergency Use  Authorization (EUA). This EUA will remain  in effect (meaning this test can be used) for the duration of the COVID-19 declaration under Section 564(b)(1) of the Act, 21 U.S.C.section 360bbb-3(b)(1), unless the authorization is terminated  or revoked sooner.       Influenza A by PCR NEGATIVE NEGATIVE Final   Influenza B by PCR NEGATIVE NEGATIVE Final    Comment: (NOTE) The Xpert Xpress SARS-CoV-2/FLU/RSV plus assay is intended as an aid in the diagnosis of influenza from Nasopharyngeal swab specimens and should not be used as a sole basis for treatment. Nasal washings and aspirates are unacceptable for Xpert Xpress SARS-CoV-2/FLU/RSV testing.  Fact Sheet for Patients: bloggercourse.com  Fact Sheet for Healthcare Providers: seriousbroker.it  This test is not yet approved or cleared by the [place]  FDA and has been authorized for detection and/or diagnosis of SARS-CoV-2 by FDA under an Emergency Use Authorization (EUA). This EUA will remain in effect (meaning this test can be used) for the duration of the COVID-19 declaration under Section 564(b)(1) of the Act, 21 U.S.C. section 360bbb-3(b)(1), unless the authorization is terminated or revoked.     Resp Syncytial Virus by PCR NEGATIVE NEGATIVE Final    Comment: (NOTE) Fact Sheet for Patients: bloggercourse.com  Fact Sheet for Healthcare  Providers: seriousbroker.it  This test is not yet approved or cleared by the [place]  FDA and has been authorized for detection and/or diagnosis of SARS-CoV-2 by FDA under an Emergency Use Authorization (EUA). This EUA will remain in effect (meaning this test can be used) for the duration of the COVID-19 declaration under Section 564(b)(1) of the Act, 21 U.S.C. section 360bbb-3(b)(1), unless the authorization is terminated or revoked.  Performed at [facility], [address] [place], [place] [address]      Labs: BNP (last 3 results) No results for input(s): BNP in the last 8760 hours. Basic Metabolic Panel: Recent Labs  Lab 04/17/24 0011 04/17/24 0821  NA 139  --   K 3.8  --   CL 100  --   CO2 28  --   GLUCOSE 107*  --   BUN 18  --   CREATININE 0.89  --   CALCIUM  9.8  --   MG  --  2.3  PHOS  --  3.7   Liver Function Tests: Recent Labs  Lab 04/17/24 0011  AST 20  ALT 15  ALKPHOS 71  BILITOT 0.4  PROT 7.2  ALBUMIN 4.4   No results for input(s): LIPASE, AMYLASE in the last 168 hours. No results for input(s): AMMONIA in the last 168 hours. CBC: Recent Labs  Lab 04/17/24 0011  WBC 8.0  NEUTROABS 4.9  HGB 12.0  HCT 36.5  MCV 88.4  PLT 377   Cardiac Enzymes: No results for input(s): CKTOTAL, CKMB, CKMBINDEX, TROPONINI in the last 168 hours. BNP: Invalid input(s): POCBNP CBG: No results for input(s): GLUCAP in the last 168 hours. D-Dimer No results for input(s): DDIMER in the last 72 hours. Hgb A1c No results for input(s): HGBA1C in the last 72 hours. Lipid Profile No results for input(s): CHOL, HDL, LDLCALC, TRIG, CHOLHDL, LDLDIRECT in the last 72 hours. Thyroid  function studies No results for input(s): TSH, T4TOTAL, T3FREE, THYROIDAB in the last 72 hours.  Invalid input(s): FREET3 Anemia work up No results for input(s): VITAMINB12, FOLATE, FERRITIN,  TIBC, IRON, RETICCTPCT in the last 72 hours. Urinalysis    Component Value Date/Time   COLORURINE YELLOW 04/17/2024 0254   APPEARANCEUR CLEAR 04/17/2024  0254   LABSPEC 1.010 04/17/2024 0254   PHURINE 6.5 04/17/2024 0254   GLUCOSEU NEGATIVE 04/17/2024 0254   HGBUR NEGATIVE 04/17/2024 0254   BILIRUBINUR NEGATIVE 04/17/2024 0254   BILIRUBINUR negative 01/28/2024 1302   KETONESUR NEGATIVE 04/17/2024 0254   PROTEINUR NEGATIVE 04/17/2024 0254   UROBILINOGEN 1.0 01/28/2024 1302   NITRITE NEGATIVE 04/17/2024 0254   LEUKOCYTESUR NEGATIVE 04/17/2024 0254   Sepsis Labs Recent Labs  Lab 04/17/24 0011  WBC 8.0   Microbiology Recent Results (from the past 240 hours)  Resp panel by RT-PCR (RSV, Flu A&B, Covid) Anterior Nasal Swab     Status: None   Collection Time: 04/16/24 11:56 PM   Specimen: Anterior Nasal Swab  Result Value Ref Range Status   SARS Coronavirus 2 by RT PCR NEGATIVE NEGATIVE Final    Comment: (NOTE) SARS-CoV-2 target nucleic acids are NOT DETECTED.  The SARS-CoV-2 RNA is generally detectable in upper respiratory specimens during the acute phase of infection. The lowest concentration of SARS-CoV-2 viral copies this assay can detect is 138 copies/mL. A negative result does not preclude SARS-Cov-2 infection and should not be used as the sole basis for treatment or other patient management decisions. A negative result may occur with  improper specimen collection/handling, submission of specimen other than nasopharyngeal swab, presence of viral mutation(s) within the areas targeted by this assay, and inadequate number of viral copies(<138 copies/mL). A negative result must be combined with clinical observations, patient history, and epidemiological information. The expected result is Negative.  Fact Sheet for Patients:  bloggercourse.com  Fact Sheet for Healthcare Providers:  seriousbroker.it  This test is no t  yet approved or cleared by the [place]  FDA and  has been authorized for detection and/or diagnosis of SARS-CoV-2 by FDA under an Emergency Use Authorization (EUA). This EUA will remain  in effect (meaning this test can be used) for the duration of the COVID-19 declaration under Section 564(b)(1) of the Act, 21 U.S.C.section 360bbb-3(b)(1), unless the authorization is terminated  or revoked sooner.       Influenza A by PCR NEGATIVE NEGATIVE Final   Influenza B by PCR NEGATIVE NEGATIVE Final    Comment: (NOTE) The Xpert Xpress SARS-CoV-2/FLU/RSV plus assay is intended as an aid in the diagnosis of influenza from Nasopharyngeal swab specimens and should not be used as a sole basis for treatment. Nasal washings and aspirates are unacceptable for Xpert Xpress SARS-CoV-2/FLU/RSV testing.  Fact Sheet for Patients: bloggercourse.com  Fact Sheet for Healthcare Providers: seriousbroker.it  This test is not yet approved or cleared by the [place]  FDA and has been authorized for detection and/or diagnosis of SARS-CoV-2 by FDA under an Emergency Use Authorization (EUA). This EUA will remain in effect (meaning this test can be used) for the duration of the COVID-19 declaration under Section 564(b)(1) of the Act, 21 U.S.C. section 360bbb-3(b)(1), unless the authorization is terminated or revoked.     Resp Syncytial Virus by PCR NEGATIVE NEGATIVE Final    Comment: (NOTE) Fact Sheet for Patients: bloggercourse.com  Fact Sheet for Healthcare Providers: seriousbroker.it  This test is not yet approved or cleared by the [place]  FDA and has been authorized for detection and/or diagnosis of SARS-CoV-2 by FDA under an Emergency Use Authorization (EUA). This EUA will remain in effect (meaning this test can be used) for the duration of the COVID-19 declaration under Section 564(b)(1)  of the Act, 21 U.S.C. section 360bbb-3(b)(1), unless the authorization is terminated or revoked.  Performed at [facility]  [address], [address] [place], [place] [address]      Time coordinating discharge: 35 minutes  SIGNED:   Sophie Mao, MD  [facility] 04/19/2024, 8:54 AM      [1]  Allergies Allergen Reactions   Losartan Swelling    Facial swelling   Amlodipine  Swelling    Tolerates 2.5mg .    Cholestyramine Other (See Comments)    Unknown   Cortisone Other (See Comments)     High blood pressure    Prednisone  Other (See Comments)    High blood pressure   Tramadol     Panic attacks when also taking perphenazine and amitriptyline     Lisinopril Cough

## 2024-04-19 NOTE — Plan of Care (Signed)

## 2024-04-19 NOTE — Progress Notes (Signed)
 Mobility Specialist - Progress Note:   04/19/24 1034  Oxygen Therapy  SpO2 91 %  O2 Device Room Air  Patient Activity (if Appropriate) Ambulating  Mobility  Activity Ambulated with assistance  Level of Assistance Contact guard assist, steadying assist  Assistive Device Four wheel walker  Distance Ambulated (ft) 250 ft  Activity Response Tolerated well  Mobility Referral Yes  Mobility visit 1 Mobility  Mobility Specialist Start Time (ACUTE ONLY) 1017  Mobility Specialist Stop Time (ACUTE ONLY) 1029  Mobility Specialist Time Calculation (min) (ACUTE ONLY) 12 min   Pt was received in bed and agreed to mobility. CGA sit to stand. Slight fatigue towards the end of session, but recovered quickly. 93% SPO2 at the end of session. Returned to recliner with all needs met. Left in room with family.  [employer] - Mobility Specialist - Acute Rehabilitation Can be reached via [employer]

## 2024-04-19 NOTE — Plan of Care (Signed)

## 2024-05-15 ENCOUNTER — Ambulatory Visit: Admitting: Internal Medicine

## 2024-06-04 ENCOUNTER — Ambulatory Visit: Admitting: Cardiology
